# Patient Record
Sex: Female | Born: 1935 | Race: White | Hispanic: No | Smoking: Never smoker
Health system: Southern US, Community
[De-identification: ages and names within clinical notes are randomized; demographics above are authoritative.]

## PROBLEM LIST (undated history)

## (undated) DIAGNOSIS — G4730 Sleep apnea, unspecified: Secondary | ICD-10-CM

## (undated) DIAGNOSIS — E669 Obesity, unspecified: Secondary | ICD-10-CM

## (undated) DIAGNOSIS — I739 Peripheral vascular disease, unspecified: Secondary | ICD-10-CM

## (undated) DIAGNOSIS — Z86718 Personal history of other venous thrombosis and embolism: Secondary | ICD-10-CM

## (undated) DIAGNOSIS — R7302 Impaired glucose tolerance (oral): Secondary | ICD-10-CM

## (undated) DIAGNOSIS — N183 Chronic kidney disease, stage 3 unspecified: Secondary | ICD-10-CM

## (undated) DIAGNOSIS — R519 Headache, unspecified: Secondary | ICD-10-CM

## (undated) DIAGNOSIS — K219 Gastro-esophageal reflux disease without esophagitis: Secondary | ICD-10-CM

## (undated) DIAGNOSIS — I4891 Unspecified atrial fibrillation: Secondary | ICD-10-CM

## (undated) DIAGNOSIS — C801 Malignant (primary) neoplasm, unspecified: Secondary | ICD-10-CM

## (undated) DIAGNOSIS — I471 Supraventricular tachycardia, unspecified: Secondary | ICD-10-CM

## (undated) DIAGNOSIS — R51 Headache: Secondary | ICD-10-CM

## (undated) DIAGNOSIS — E785 Hyperlipidemia, unspecified: Secondary | ICD-10-CM

## (undated) DIAGNOSIS — I10 Essential (primary) hypertension: Secondary | ICD-10-CM

## (undated) DIAGNOSIS — Z972 Presence of dental prosthetic device (complete) (partial): Secondary | ICD-10-CM

## (undated) HISTORY — PX: COLON RESECTION: SHX5231

## (undated) HISTORY — PX: BREAST BIOPSY: SHX20

## (undated) HISTORY — PX: OTHER SURGICAL HISTORY: SHX169

## (undated) HISTORY — PX: HERNIA REPAIR: SHX51

## (undated) HISTORY — PX: ABDOMINAL HYSTERECTOMY: SHX81

---

## 2004-04-21 ENCOUNTER — Ambulatory Visit: Payer: Self-pay | Admitting: Internal Medicine

## 2005-04-24 ENCOUNTER — Ambulatory Visit: Payer: Self-pay | Admitting: Internal Medicine

## 2006-03-15 ENCOUNTER — Ambulatory Visit: Payer: Self-pay | Admitting: Unknown Physician Specialty

## 2006-03-16 ENCOUNTER — Ambulatory Visit: Payer: Self-pay | Admitting: Unknown Physician Specialty

## 2006-04-26 ENCOUNTER — Ambulatory Visit: Payer: Self-pay | Admitting: Internal Medicine

## 2006-06-11 ENCOUNTER — Ambulatory Visit: Payer: Self-pay | Admitting: Unknown Physician Specialty

## 2006-06-25 ENCOUNTER — Ambulatory Visit: Payer: Self-pay | Admitting: Internal Medicine

## 2006-06-26 ENCOUNTER — Ambulatory Visit: Payer: Self-pay | Admitting: Internal Medicine

## 2006-08-15 ENCOUNTER — Ambulatory Visit: Payer: Self-pay | Admitting: Internal Medicine

## 2006-08-16 ENCOUNTER — Ambulatory Visit: Payer: Self-pay | Admitting: Internal Medicine

## 2006-11-27 ENCOUNTER — Ambulatory Visit: Payer: Self-pay | Admitting: Internal Medicine

## 2006-12-03 ENCOUNTER — Ambulatory Visit: Payer: Self-pay | Admitting: Internal Medicine

## 2006-12-25 ENCOUNTER — Ambulatory Visit: Payer: Self-pay | Admitting: Internal Medicine

## 2007-01-04 ENCOUNTER — Ambulatory Visit: Payer: Self-pay | Admitting: Internal Medicine

## 2007-01-16 ENCOUNTER — Ambulatory Visit: Payer: Self-pay | Admitting: Internal Medicine

## 2007-01-25 ENCOUNTER — Ambulatory Visit: Payer: Self-pay | Admitting: Internal Medicine

## 2007-06-03 ENCOUNTER — Ambulatory Visit: Payer: Self-pay | Admitting: Internal Medicine

## 2007-06-13 ENCOUNTER — Ambulatory Visit: Payer: Self-pay | Admitting: Internal Medicine

## 2007-06-27 ENCOUNTER — Ambulatory Visit: Payer: Self-pay | Admitting: Internal Medicine

## 2007-09-16 ENCOUNTER — Other Ambulatory Visit: Payer: Self-pay

## 2007-09-16 ENCOUNTER — Ambulatory Visit: Payer: Self-pay | Admitting: Ophthalmology

## 2007-11-25 ENCOUNTER — Ambulatory Visit: Payer: Self-pay | Admitting: Internal Medicine

## 2007-12-03 ENCOUNTER — Ambulatory Visit: Payer: Self-pay | Admitting: Internal Medicine

## 2007-12-14 ENCOUNTER — Ambulatory Visit: Payer: Self-pay | Admitting: Internal Medicine

## 2007-12-25 ENCOUNTER — Ambulatory Visit: Payer: Self-pay | Admitting: Internal Medicine

## 2008-06-15 ENCOUNTER — Ambulatory Visit: Payer: Self-pay | Admitting: Internal Medicine

## 2008-11-24 ENCOUNTER — Ambulatory Visit: Payer: Self-pay | Admitting: Internal Medicine

## 2008-12-15 ENCOUNTER — Ambulatory Visit: Payer: Self-pay | Admitting: Internal Medicine

## 2008-12-24 ENCOUNTER — Ambulatory Visit: Payer: Self-pay | Admitting: Internal Medicine

## 2009-06-17 ENCOUNTER — Ambulatory Visit: Payer: Self-pay | Admitting: Internal Medicine

## 2009-11-24 ENCOUNTER — Ambulatory Visit: Payer: Self-pay | Admitting: Internal Medicine

## 2009-12-16 ENCOUNTER — Ambulatory Visit: Payer: Self-pay | Admitting: Internal Medicine

## 2009-12-22 ENCOUNTER — Ambulatory Visit: Payer: Self-pay | Admitting: Internal Medicine

## 2010-06-23 ENCOUNTER — Ambulatory Visit: Payer: Self-pay | Admitting: Internal Medicine

## 2010-09-21 ENCOUNTER — Ambulatory Visit: Payer: Self-pay | Admitting: Unknown Physician Specialty

## 2010-10-05 ENCOUNTER — Ambulatory Visit: Payer: Self-pay | Admitting: Unknown Physician Specialty

## 2011-01-24 ENCOUNTER — Ambulatory Visit: Payer: Self-pay | Admitting: Internal Medicine

## 2011-01-25 ENCOUNTER — Ambulatory Visit: Payer: Self-pay | Admitting: Internal Medicine

## 2011-02-25 ENCOUNTER — Ambulatory Visit: Payer: Self-pay | Admitting: Internal Medicine

## 2011-05-17 ENCOUNTER — Ambulatory Visit: Payer: Self-pay | Admitting: Unknown Physician Specialty

## 2011-07-05 ENCOUNTER — Ambulatory Visit: Payer: Self-pay | Admitting: Internal Medicine

## 2011-10-19 LAB — CBC WITH DIFFERENTIAL/PLATELET
Basophil #: 0 10*3/uL (ref 0.0–0.1)
Basophil %: 0.5 %
Comment - H1-Com2: NORMAL
HCT: 39 % (ref 35.0–47.0)
HGB: 13.7 g/dL (ref 12.0–16.0)
Lymphocyte #: 2 10*3/uL (ref 1.0–3.6)
Lymphocyte %: 23.1 %
MCH: 33.7 pg (ref 26.0–34.0)
MCV: 96 fL (ref 80–100)
Monocyte %: 8.6 %
Neutrophil #: 5.7 10*3/uL (ref 1.4–6.5)
Neutrophil %: 66.9 %
Platelet: 156 10*3/uL (ref 150–440)
RDW: 12.8 % (ref 11.5–14.5)
WBC: 8.6 10*3/uL (ref 3.6–11.0)

## 2011-10-19 LAB — COMPREHENSIVE METABOLIC PANEL
Albumin: 3.5 g/dL (ref 3.4–5.0)
Anion Gap: 10 (ref 7–16)
Bilirubin,Total: 0.5 mg/dL (ref 0.2–1.0)
Calcium, Total: 8.4 mg/dL — ABNORMAL LOW (ref 8.5–10.1)
Chloride: 99 mmol/L (ref 98–107)
Co2: 27 mmol/L (ref 21–32)
Creatinine: 0.95 mg/dL (ref 0.60–1.30)
EGFR (Non-African Amer.): 58 — ABNORMAL LOW
Glucose: 95 mg/dL (ref 65–99)
Osmolality: 273 (ref 275–301)
Potassium: 3.2 mmol/L — ABNORMAL LOW (ref 3.5–5.1)
SGOT(AST): 16 U/L (ref 15–37)
SGPT (ALT): 19 U/L

## 2011-10-19 LAB — PROTIME-INR
INR: 0.9
Prothrombin Time: 13 secs (ref 11.5–14.7)

## 2011-10-19 LAB — APTT: Activated PTT: 29.6 secs (ref 23.6–35.9)

## 2011-10-19 LAB — CK TOTAL AND CKMB (NOT AT ARMC): CK, Total: 109 U/L (ref 21–215)

## 2011-10-19 LAB — TROPONIN I: Troponin-I: 0.02 ng/mL

## 2011-10-20 ENCOUNTER — Inpatient Hospital Stay: Payer: Self-pay | Admitting: Specialist

## 2011-10-20 LAB — URINALYSIS, COMPLETE
Bilirubin,UR: NEGATIVE
Blood: NEGATIVE
Nitrite: NEGATIVE
Ph: 5 (ref 4.5–8.0)
Protein: NEGATIVE
RBC,UR: 5 /HPF (ref 0–5)
Squamous Epithelial: 3
Transitional Epi: 1
WBC UR: 39 /HPF (ref 0–5)

## 2011-10-20 LAB — HEMOGLOBIN A1C: Hemoglobin A1C: 6.1 % (ref 4.2–6.3)

## 2011-10-20 LAB — LIPID PANEL: HDL Cholesterol: 34 mg/dL — ABNORMAL LOW (ref 40–60)

## 2011-10-20 LAB — CK TOTAL AND CKMB (NOT AT ARMC): CK-MB: 2.5 ng/mL (ref 0.5–3.6)

## 2011-10-20 LAB — TSH: Thyroid Stimulating Horm: 3.44 u[IU]/mL

## 2011-10-20 LAB — MAGNESIUM: Magnesium: 1.8 mg/dL

## 2012-07-05 ENCOUNTER — Ambulatory Visit: Payer: Self-pay | Admitting: Internal Medicine

## 2012-08-28 ENCOUNTER — Ambulatory Visit: Payer: Self-pay | Admitting: Ophthalmology

## 2013-07-10 ENCOUNTER — Inpatient Hospital Stay: Payer: Self-pay | Admitting: Internal Medicine

## 2013-07-10 DIAGNOSIS — Z86718 Personal history of other venous thrombosis and embolism: Secondary | ICD-10-CM

## 2013-07-10 HISTORY — DX: Personal history of other venous thrombosis and embolism: Z86.718

## 2013-07-10 LAB — CK TOTAL AND CKMB (NOT AT ARMC)
CK, Total: 51 U/L (ref 21–215)
CK, Total: 56 U/L
CK, Total: 43 U/L (ref 21–215)
CK-MB: 1.6 ng/mL (ref 0.5–3.6)
CK-MB: 1.6 ng/mL
CK-MB: 1.6 ng/mL (ref 0.5–3.6)

## 2013-07-10 LAB — COMPREHENSIVE METABOLIC PANEL
ALK PHOS: 70 U/L
ANION GAP: 8 (ref 7–16)
Albumin: 3.5 g/dL (ref 3.4–5.0)
BILIRUBIN TOTAL: 0.6 mg/dL (ref 0.2–1.0)
BUN: 14 mg/dL (ref 7–18)
CHLORIDE: 97 mmol/L — AB (ref 98–107)
Calcium, Total: 8.6 mg/dL (ref 8.5–10.1)
Co2: 27 mmol/L (ref 21–32)
Creatinine: 0.82 mg/dL (ref 0.60–1.30)
EGFR (African American): >60
GLUCOSE: 117 mg/dL — AB (ref 65–99)
Osmolality: 266 (ref 275–301)
Potassium: 3.2 mmol/L — ABNORMAL LOW (ref 3.5–5.1)
SGOT(AST): 16 U/L (ref 15–37)
SGPT (ALT): 16 U/L (ref 12–78)
Sodium: 132 mmol/L — ABNORMAL LOW (ref 136–145)
TOTAL PROTEIN: 6.9 g/dL (ref 6.4–8.2)

## 2013-07-10 LAB — CBC
HCT: 43.8 % (ref 35.0–47.0)
HGB: 15.1 g/dL (ref 12.0–16.0)
MCH: 32.5 pg (ref 26.0–34.0)
MCHC: 34.6 g/dL (ref 32.0–36.0)
MCV: 94 fL (ref 80–100)
PLATELETS: 155 10*3/uL (ref 150–440)
RBC: 4.66 10*6/uL (ref 3.80–5.20)
RDW: 12.9 % (ref 11.5–14.5)
WBC: 9 10*3/uL (ref 3.6–11.0)

## 2013-07-10 LAB — PROTIME-INR
INR: 1
PROTHROMBIN TIME: 12.6 s (ref 11.5–14.7)

## 2013-07-10 LAB — TROPONIN I
Troponin-I: <0.02 ng/mL
Troponin-I: <0.02 ng/mL

## 2013-07-10 LAB — LIPID PANEL
Cholesterol: 160 mg/dL
HDL Cholesterol: 54 mg/dL
Ldl Cholesterol, Calc: 74 mg/dL
Triglycerides: 159 mg/dL
VLDL Cholesterol, Calc: 32 mg/dL

## 2013-07-10 LAB — APTT
ACTIVATED PTT: 84.7 s — AB (ref 23.6–35.9)
Activated PTT: 29.7 secs (ref 23.6–35.9)
Activated PTT: 119.1 s — ABNORMAL HIGH

## 2013-07-11 LAB — COMPREHENSIVE METABOLIC PANEL
ALK PHOS: 66 U/L
Albumin: 3 g/dL — ABNORMAL LOW (ref 3.4–5.0)
Anion Gap: 4 — ABNORMAL LOW (ref 7–16)
BUN: 14 mg/dL (ref 7–18)
Bilirubin,Total: 0.7 mg/dL (ref 0.2–1.0)
CALCIUM: 8.4 mg/dL — AB (ref 8.5–10.1)
Chloride: 98 mmol/L (ref 98–107)
Co2: 33 mmol/L — ABNORMAL HIGH (ref 21–32)
Creatinine: 0.91 mg/dL (ref 0.60–1.30)
EGFR (African American): >60
EGFR (Non-African Amer.): >60
Glucose: 116 mg/dL — ABNORMAL HIGH (ref 65–99)
Osmolality: 272 (ref 275–301)
Potassium: 3.3 mmol/L — ABNORMAL LOW (ref 3.5–5.1)
SGOT(AST): 8 U/L — ABNORMAL LOW (ref 15–37)
SGPT (ALT): 13 U/L (ref 12–78)
SODIUM: 135 mmol/L — AB (ref 136–145)
TOTAL PROTEIN: 6 g/dL — AB (ref 6.4–8.2)

## 2013-07-11 LAB — CBC WITH DIFFERENTIAL/PLATELET
BASOS PCT: 0.7 %
Basophil #: 0.1 10*3/uL (ref 0.0–0.1)
Eosinophil #: 0.2 10*3/uL (ref 0.0–0.7)
Eosinophil %: 2.9 %
HCT: 39.5 % (ref 35.0–47.0)
HGB: 13.4 g/dL (ref 12.0–16.0)
LYMPHS ABS: 1.8 10*3/uL (ref 1.0–3.6)
Lymphocyte %: 24.1 %
MCH: 32 pg (ref 26.0–34.0)
MCHC: 33.9 g/dL (ref 32.0–36.0)
MCV: 95 fL (ref 80–100)
MONO ABS: 0.6 x10 3/mm (ref 0.2–0.9)
Monocyte %: 7.7 %
NEUTROS ABS: 4.7 10*3/uL (ref 1.4–6.5)
NEUTROS PCT: 64.6 %
Platelet: 140 10*3/uL — ABNORMAL LOW (ref 150–440)
RBC: 4.18 10*6/uL (ref 3.80–5.20)
RDW: 13.4 % (ref 11.5–14.5)
WBC: 7.3 10*3/uL (ref 3.6–11.0)

## 2013-07-11 LAB — APTT: Activated PTT: 85.8 secs — ABNORMAL HIGH (ref 23.6–35.9)

## 2013-07-12 LAB — APTT
ACTIVATED PTT: 63.2 s — AB (ref 23.6–35.9)
Activated PTT: 46.2 secs — ABNORMAL HIGH (ref 23.6–35.9)

## 2013-08-27 ENCOUNTER — Ambulatory Visit: Payer: Self-pay | Admitting: Family Medicine

## 2014-01-08 ENCOUNTER — Emergency Department: Payer: Self-pay | Admitting: Emergency Medicine

## 2014-01-13 DIAGNOSIS — E782 Mixed hyperlipidemia: Secondary | ICD-10-CM | POA: Insufficient documentation

## 2014-02-17 DIAGNOSIS — I824Y2 Acute embolism and thrombosis of unspecified deep veins of left proximal lower extremity: Secondary | ICD-10-CM | POA: Insufficient documentation

## 2014-02-17 DIAGNOSIS — K219 Gastro-esophageal reflux disease without esophagitis: Secondary | ICD-10-CM | POA: Insufficient documentation

## 2014-10-17 NOTE — Consult Note (Signed)
PATIENT NAME:  Tiffany Shaffer, VU MR#:  [id number] DATE OF BIRTH:  1931-08-23  DATE OF CONSULTATION:  07/11/2013  REFERRING PHYSICIAN:  Dr. Minerva Fester PHYSICIAN:  Corey Skains, MD  REASON FOR CONSULTATION: Atrial fibrillation with embolic phenomenon and hypertension.   CHIEF COMPLAINT: "My leg hurts."  HISTORY OF PRESENT ILLNESS: This is a 81 year old female with known supraventricular tachycardia with intermittent atrial fibrillation, on metoprolol, who has had an EKG of atrial fibrillation with right bundle branch block and heart rate control, 78 beats per minute. The patient has had an acute onset of embolic phenomenon to her foot with significant pain and is now status post embolectomy without any complications. There is still mild pain, but good arterial pulse. The patient has had no evidence of other strokelike symptoms or other issues, but has had some mild hypertension. Therefore, her CHADS score is 3, and she should be on anticoagulation long-term. We have discussed other treatments as well. The patient has had no evidence of significant hyperlipidemia requiring additional medication management. Echocardiogram has shown normal LV systolic function with ejection fraction of 60% with no evidence of acute coronary syndrome and/or heart failure. The patient has not had any troponin elevation. Currently, the patient feels better.   REVIEW OF SYSTEMS: Remainder negative for vision change, ringing in the ears, hearing loss, cough, congestion, heartburn, nausea, vomiting, diarrhea, bloody stool, stomach pain, extremity pain. Is positive with recent embolism.  leg weakness, cramping of the buttocks, known blood clots, headaches, blackouts, dizzy spells, nosebleeds, congestion, trouble swallowing, frequent urination, urination at night, muscle weakness, numbness, anxiety, depression, skin lesions, skin rashes.   PAST MEDICAL HISTORY: 1.  Atrial fibrillation.  2.  Hypertension.  3.   Hyperlipidemia.   FAMILY HISTORY: No family members with early onset of cardiovascular disease or hypertension.   SOCIAL HISTORY: Currently denies alcohol or tobacco use.   ALLERGIES: She has no known drug allergies.   CURRENT MEDICATIONS: As listed.   PHYSICAL EXAMINATION: VITAL SIGNS: Blood pressure 120/65 bilaterally, heart rate 78 upright, reclining, and irregular.  GENERAL: She is a well-appearing female in no acute distress.  HEAD, EYES, EARS, NOSE, THROAT: No icterus, thyromegaly, ulcers, hemorrhage, or xanthelasma.  CARDIOVASCULAR: Irregularly regular with normal S1 and S2 without murmur, gallop, or rub. PMI is normal size and placement. Carotid upstroke normal without bruit. Jugular venous pressure is normal.  LUNGS: Clear to auscultation with normal respirations.  ABDOMEN: Soft, nontender, without hepatosplenomegaly or masses. Abdominal aorta is normal size without bruit.  EXTREMITIES: Show 2+ radial, femoral pulses with no lower extremity edema, cyanosis, clubbing, or ulcers.  NEUROLOGIC: She is oriented to time, place, and person with normal mood and affect.   ASSESSMENT: This is a 81 year old female with embolic phenomenon, likely from atrial fibrillation and hypertension with hyperlipidemia, needing further treatment options.   RECOMMENDATIONS: 1.  Continue metoprolol current dose, which is controlling heart rate, with no evidence that she will be able to get back into the normal rhythm.  2.  Continue heparin for anticoagulation for embolic phenomenon and change to Xarelto as soon as possible, avoiding potential bleeding complications with recent procedure with embolectomy, for long-term anticoagulation, for risk reduction of embolism and stroke with a CHADS score of 3. 3.  Ambulation without restriction.  4.  No further intervention or other treatment options necessary for valvular heart disease. Clinically stable.  5.  Outpatient evaluation and continued management after  discharge in 1 week.   ____________________________ Corey Skains, MD [id number]  D: 07/11/2013 17:04:00 ET T: 07/11/2013 17:39:00 ET JOB#: [id number]  cc: Corey Skains, MD, <Dictator> Corey Skains MD ELECTRONICALLY SIGNED 07/14/2013 8:46

## 2014-10-17 NOTE — Consult Note (Signed)
CHIEF COMPLAINT and HISTORY:  Subjective/Chief Complaint left leg pain, numbness   History of Present Illness Patient is admitted with left leg pain and numbness.  Numbness is better and she has motor function as well.  left foot still painful and cooler than right.  Recent onset a. fib.  Was not on anticoagulants.  CT shows embolus at left TP trunk.   PAST MEDICAL/SURGICAL HISTORY:  Past Medical History:   Atrial Fibrillation:    Bilateral adrenal gland masses:    Osteoarthritis:    GERD:    HTN:    partial hysterectomy:    Colonic resection:   ALLERGIES:  Allergies:  No Known Allergies:   HOME MEDICATIONS:  Home Medications: Medication Instructions Status  chlorthalidone 50 mg oral tablet 0.5 tab(s) orally once a day  Active  Acuprin 81 oral tablet 1  orally once a day  Active  glucosamine-chondroitin 2 tab(s)  once a day Active  omeprazole 20 mg oral delayed release capsule 1 cap(s) orally once a day Active  metoprolol tartrate 50 mg oral tablet 1 tab(s) orally 2 times a day Active   Family and Social History:  Family History Non-Contributory   Social History negative tobacco, negative ETOH   Place of Living Home   Review of Systems:  Subjective/Chief Complaint a fib left leg symptoms   Fever/Chills No   Cough No   Sputum No   Abdominal Pain No   Diarrhea No   Constipation No   Nausea/Vomiting No   SOB/DOE No   Chest Pain No   Telemetry Reviewed Afib   Dysuria No   Tolerating PT Yes   Tolerating Diet Yes   Medications/Allergies Reviewed Medications/Allergies reviewed   Physical Exam:  GEN well developed, well nourished   HEENT pink conjunctivae, moist oral mucosa   NECK No masses  trachea midline   RESP normal resp effort  no use of accessory muscles   CARD irregular rate  no JVD   VASCULAR ACCESS none   ABD denies tenderness  normal BS   GU foley catheter in place  clear yellow urine draining   LYMPH negative neck,  negative axillae   EXTR negative cyanosis/clubbing, negative edema, LLE cool, no palpable pedal pulses on left.  Good pulses on right   SKIN normal to palpation   NEURO cranial nerves intact, motor/sensory function intact   PSYCH alert, A+O to time, place, person   LABS:  Laboratory Results: Hepatic:    15-Jan-15 03:27, Comprehensive Metabolic Panel  Bilirubin, Total 0.6  Alkaline Phosphatase 70  45-117  NOTE: New Reference Range  05/16/13  SGPT (ALT) 16  SGOT (AST) 16  Total Protein, Serum 6.9  Albumin, Serum 3.5  Cardiology:    15-Jan-15 03:17, ECG  Ventricular Rate 105  Atrial Rate 87  QRS Duration 134  QT 360  QTc 475  R Axis 57  T Axis -68  ECG interpretation   Atrial fibrillation with rapid ventricular response  Right bundle branch block  T wave abnormality, consider inferolateral ischemia or digitalis effect  Abnormal ECG  When compared with ECG of 19-Oct-2011 22:33,  T wave inversion more evident in Inferior leads  ----------unconfirmed----------  Confirmed by OVERREAD, NOT ([id number]), editor PEARSON, BARBARA ([id number]) on 07/10/2013 8:16:12 AM  ECG   Routine Chem:    15-Jan-15 03:27, Comprehensive Metabolic Panel  Glucose, Serum 117  BUN 14  Creatinine (comp) 0.82  Sodium, Serum 132  Potassium, Serum 3.2  Chloride, Serum 97  CO2, Serum 27  Calcium (Total), Serum 8.6  Osmolality (calc) 266  eGFR (African American) >60  eGFR (Non-African American) >60  eGFR values <74m/min/1.73 m2 may be an indication of chronic  kidney disease (CKD).  Calculated eGFR is useful in patients with stable renal function.  The eGFR calculation will not be reliable in acutely ill patients  when serum creatinine is changing rapidly. It is not useful in   patients on dialysis. The eGFR calculation may not be applicable  to patients at the low and high extremes of body sizes, pregnant  women, and vegetarians.  Anion Gap 8    15-Jan-15 03:27, Lipid Profile ([facility])  Cholesterol, Serum  160  Triglycerides, Serum 159  HDL (INHOUSE) 54  VLDL Cholesterol Calculated 32  LDL Cholesterol Calculated 74  Result(s) reported on 10 Jul 2013 at 08:41AM.  Cardiac:    15-Jan-15 03:27, Cardiac Panel  CK, Total 51  CPK-MB, Serum 1.6  Result(s) reported on 10 Jul 2013 at 04:08AM.    15-Jan-15 03:27, Troponin I  Troponin I < 0.02  0.00-0.05  0.05 ng/mL or less: NEGATIVE   Repeat testing in 3-6 hrs   if clinically indicated.  >0.05 ng/mL: POTENTIAL   MYOCARDIAL INJURY. Repeat   testing in 3-6 hrs if   clinically indicated.  NOTE: An increase or decrease   of 30% or more on serial   testing suggests a   clinically important change    15-Jan-15 14:22, Cardiac Panel  CK, Total 56  CPK-MB, Serum 1.6  Result(s) reported on 10 Jul 2013 at 04:01PM.    15-Jan-15 14:22, Troponin I  Troponin I < 0.02  0.00-0.05  0.05 ng/mL or less: NEGATIVE   Repeat testing in 3-6 hrs   if clinically indicated.  >0.05 ng/mL: POTENTIAL   MYOCARDIAL INJURY. Repeat   testing in 3-6 hrs if   clinically indicated.  NOTE: An increase or decrease   of 30% or more on serial   testing suggests a   clinically important change  Routine Coag:    15-Jan-15 03:27, Activated PTT  Activated PTT (APTT) 29.7  A HCT value >55% may artifactually increase the APTT. In one study,  the increase was an average of 19%.  Reference: "Effect on Routine and Special Coagulation Testing Values  of Citrate Anticoagulant Adjustment in Patients with High HCT Values."  American Journal of Clinical Pathology 2006;126:400-405.    15-Jan-15 03:27, Prothrombin Time  Prothrombin 12.6  INR 1.0  INR reference interval applies to patients on anticoagulant therapy.  A single INR therapeutic range for coumarins is not optimal for all  indications; however, the suggested range for most indications is  2.0 - 3.0.  Exceptions to the INR Reference Range may include: Prosthetic heart  valves, acute myocardial infarction, prevention of  myocardial  infarction, and combinations of aspirin and anticoagulant. The need  for a higher or lower target INR must be assessed individually.  Reference: The Pharmacology and Management of the Vitamin K   antagonists: the seventh ACCP Conference on Antithrombotic and  Thrombolytic Therapy. COIBBC.4888Sept:126 (3suppl): [id number]  A HCT value >55% may artifactually increase the PT.  In one study,   the increase was an average of 25%.  Reference:  "Effect on Routine and Special Coagulation Testing Values  of Citrate Anticoagulant Adjustment in Patients with High HCT Values."  American Journal of Clinical Pathology 2006;126:400-405.    15-Jan-15 14:22, Activated PTT  Activated PTT (APTT) 119.1  A HCT value >55% may artifactually increase the APTT.  In one study,  the increase was an average of 19%.  Reference: "Effect on Routine and Special Coagulation Testing Values  of Citrate Anticoagulant Adjustment in Patients with High HCT Values."  American Journal of Clinical Pathology 2006;126:400-405.  Routine Hem:    15-Jan-15 03:27, Hemogram, Platelet Count  WBC (CBC) 9.0  RBC (CBC) 4.66  Hemoglobin (CBC) 15.1  Hematocrit (CBC) 43.8  Platelet Count (CBC) 155  Result(s) reported on 10 Jul 2013 at 03:48AM.  MCV 94  MCH 32.5  MCHC 34.6  RDW 12.9   RADIOLOGY:  Radiology Results: XRay:    15-Jan-15 03:41, Chest Portable Single View  Chest Portable Single View  REASON FOR EXAM:    CP  COMMENTS:       PROCEDURE: DXR - DXR PORTABLE CHEST SINGLE VIEW  - Jul 10 2013  3:41AM     CLINICAL DATA:  Chest pain and subjective tachycardia    EXAM:  PORTABLE CHEST - 1 VIEW    COMPARISON:  10/19/2011    FINDINGS:  Normalheart size and mediastinal contours. No acute infiltrate or  edema. No effusion or pneumothorax. No acute osseous findings.   IMPRESSION:  No active disease.      Electronically Signed    By: Jorje Guild M.D.    On: 07/10/2013 04:20         Verified By:  Gilford Silvius, M.D.,  [place]:  PACS Image    15-Jan-15 05:51, CT Angiography Aorta Iliofemoral Runoff  PACS Image  CT:  CT Angiography Aorta Iliofemoral Runoff  REASON FOR EXAM:    AFIB W/ RVR; LEFT FOOT COOL  COMMENTS:   May transport without cardiac monitor    PROCEDURE: CT  - CT ANGIOGRAPHY AORTA RUNOFF  - Jul 10 2013  5:51AM     CLINICAL DATA:  81 year old female with AFib and RVR and left foot  pain    EXAM:  CT ANGIOGRAPHY OF ABDOMINAL AORTA WITH ILIOFEMORAL RUNOFF    TECHNIQUE:  Multidetector CT imaging of the abdomen, pelvis and lower  extremities was performed using the standard protocol during bolus  administration of intravenous contrast. Multiplanar CT image  reconstructions including MIPs were obtained to evaluate the  vascular anatomy.    CONTRAST:  One hundred twenty-five no L Isovue 370 administered  intravenously    COMPARISON:  Prior CT chest/abdomen/pelvis 01/25/2011 currently  unavailable for review    FINDINGS:  VASCULAR    Aorta: Normal caliber aorta with heterogeneous predominantly  calcified atherosclerotic plaque throughout. No aneurysmal  dilatation, dissection, or irregular plaque or wall adherent  thrombus. No penetrating aortic ulcer.    Celiac: Calcified atherosclerotic plaque at the origin without  significant narrowing. Conventional hepatic arterial anatomy. No  visceral artery aneurysm.    SMA: Focal calcified plaque at the origin results in mild narrowing.  No significant poststenotic dilatation. The distal branches are  patent.    Renals: Calcified and noncalcified atherosclerotic plaque results  and mild to moderate narrowing of the origin of the left renal  artery. No significant narrowing of the rightrenal artery. Single  dominant renal arteries are noted bilaterally. No significant  accessory renals.  IMA: Patent and unremarkable.    Right Lower Extremity    Inflow: Minimal atherosclerotic plaque without  significant stenosis  or acute abnormality involving the right common and external iliac  arteries. There may be mild narrowing of the proximal right  hypogastric artery.    Outflow: Trace atherosclerotic plaque posteriorly in the common  femoral  artery. Mild scattered calcified plaque throughout the  superficial femoral and popliteal arteries without significant  stenosis.    Runoff: The proximal anterior tibial artery and tibioperoneal trunk  are minimally diseased and widely patent. The posterior tibial  artery abruptly ceases to opacify with contrast material in the mid  calf. There is moderate narrowing of the anterior tibial artery  secondary to calcified plaque at the same level. Anterior tibial and  peroneal arteries remain patent to the ankle. The plantar arch is  patent. There is some retrograde flow into the posterior tibial  artery.    Left Lower Extremity    Inflow: Trace atherosclerotic plaque in the iliac arteries. No acute  abnormality.    Outflow: No significant plaque in the common femoral artery. Minimal  atherosclerotic disease in the superficial femoral artery without  significant stenosis. The popliteal artery is patent.    Runoff: Non calcified atherosclerotic plaque results in moderate  narrowing of the origin of the anterior tibial artery. Approximately  1 cm beyond the origin of the anterior tibial artery there is a  focal filling defect within the tibioperoneal trunk extending into  the proximal peroneal and posterior tibial arteries consistent with  an acute embolus. Both vessels reconstitute just after the embolism  via retrograde flow. However, the posterior tibial artery is  diffusely and there are multi focal high-grade stenoses versus  additional small emboli in in the lower calf. The vessel ceased to  opacify with contrast material before the ankle. Additionally, the  anterior tibial artery becomes atretic and occludes just above the  ankle.  The vessel is very small caliber in this location. It is  unclear if this is secondary to stenosis or embolus. The peroneal  artery remains patent to the ankle. The distal AT reconstitutes as  the dorsalis pedis artery. There is some retrograde flow into the  distal posterior tibial artery.    Review of the MIP images confirms the above findings.    Veins: No focal venous abnormality.    NON-VASCULAR    Lower Chest: Dependent atelectasis in the lower lobes. The lung  bases are otherwise clear. Cardiomegaly with right greater than left  atrial enlargement and right ventricular dilatation. The coronary  sinus is also dilated. Atherosclerotic calcifications noted in the  coronary arteries. No pericardial effusion. No atrial or ventricular  thrombus in the visualized portions of the heart. Of note, the right  atrial appendage is partially imaged in the left atrial appendage  not included in the field of view.    Abdomen: Unremarkable CTA appearance of the stomach, duodenum,  spleen, and pancreas. An enhancing 1.7 x 2.7 right adrenal nodule is  nonspecific. Internal regions of low attenuation (less than 10  Hounsfield units) are most suggestive of a benign adenoma. There is  a smaller 1.5 cm nodule in the left adrenal gland. Normal hepatic  contour and morphology. No discrete hepatic lesion. Cholelithiasis  without secondary signs to suggest acute cholecystitis.    Unremarkable appearance of the bilateral kidneys. No focal solid  lesion, hydronephrosis or nephrolithiasis.  No evidence of obstruction or focal bowel wall thickening. The  terminal ileum is unremarkable. Surgical changes suggest prior  partial resection of the transverse colon with 2 patent primary  colocolonic anastomoses. The appendix is not identified and may be  surgically absent. No suspicious adenopathy or free fluid.    Pelvis: Surgical changes of prior hysterectomy. Unremarkable  bladder. No free fluid or  suspicious adenopathy.  Bones/Soft Tissues: No acute fracture or aggressive appearing lytic  or blastic osseous lesion. Multilevel degenerative disc disease and  lower lumbar facet arthropathy. Disc height loss most significant at  L4-L5.     IMPRESSION:  1. Acute thromboembolism in the left tibioperoneal trunk extending  into the proximal peroneal and posterior tibial arteries. Given  clinical history of AFib with RVR, this is highly likely an embolic  process. Additionally, the right posterior tibial artery abruptly  occludes in the mid calf, and the left anterior tibial artery  occludes just above the ankle. It is unclear if these occlusions  reflect additional small emboli or are simply due to the underlying  runoff disease.  2. Extensive peripheral atherosclerotic vascular disease without  significant stenosis in the inflow or outflow vessels. The majority  of the disease is confined to the bilateral runoff vessels.  Two-vessel runoff to the right foot (anterior tibial and peroneal)  and single-vessel runoff to the left foot (peroneal).  3. Bilateral adrenal nodules are favored to reflect benign adenomas.  There is a prior CT scan of the chest, abdomen and pelvis from  January 25, 2011 and December 16, 2009, both of which are currently  unavailable for review. Recommend correlation with the reports from  these prior studies to assess for stability. If the nodules were  previously present they are almost certainly benign.  4. Cardiomegaly with right greater than left atrial enlargement and  right ventricular dilatation. Dilation of the coronary sinus  suggests elevated right heart pressures/right heart strain. Of note,  no atrial or ventricular thrombus identified.  5. Cholelithiasis without evidence of cholecystitis.  6.Additional ancillary findings as above.    Initial preliminary findings of acute left tibioperoneal trunk  occlusion were relayed to Dr. Beather Arbour at 6:10 a.m. by Dr.  Pascal Lux.    Signed,  Criselda Peaches, MD    [facility]    [facility]: [facility]      Electronically Signed    By: Jacqulynn Cadet M.D.    On: 07/10/2013 07:35         Verified By: Criselda Peaches, M.D.,   ASSESSMENT AND PLAN:  Assessment/Admission Diagnosis left leg embolus with rest pain, likely from a. fib   Plan this is a potentially limb threatening situation and we have performed urgent percutaneous intervention.  Situation discussed with patient in detail.  Will need anticoagulation in hospital and at discharge.     level 4   Electronic Signatures: Algernon Huxley (MD)  (Signed 15-Jan-15 16:48)  Authored: Chief Complaint and History, PAST MEDICAL/SURGICAL HISTORY, ALLERGIES, HOME MEDICATIONS, Family and Social History, Review of Systems, Physical Exam, LABS, RADIOLOGY, Assessment and Plan   Last Updated: 15-Jan-15 16:48 by Algernon Huxley (MD)

## 2014-10-17 NOTE — H&P (Signed)
PATIENT NAME:  Tiffany Shaffer, Tiffany Shaffer MR#:  [id number] DATE OF BIRTH:  [birth year]  DATE OF ADMISSION:  07/10/2013  PRIMARY CARE PHYSICIAN: Apolonio Schneiders, MD  HISTORY OF PRESENT ILLNESS: The patient is a 81 year old Caucasian female with past medical history significant for history of admission for SVT in April 2013, presents to the hospital with complaints of left leg numbness as well as feeling cold. According to the patient, she was doing well up until a couple of days ago when her feet started feeling somewhat funny and strange, but she had no significant pains. She started noticing left foot numbness as well as feeling cold since around 11:30 yesterday, 14th of January 2015. She decided to come to the Emergency Room for further evaluation. Upon evaluation she was noted to be in A-fib, which is new diagnosis for her. She also had CTA of her leg done and was noted to have acute thromboembolism in left tibioperoneal trunk extending into proximal peroneal as well as posterior tibial arteries. Hospitalist services were contacted for admission.  PAST MEDICAL HISTORY: Significant for admission in April 2013 for SVT, hypertension, gastroesophageal reflux disease, osteoporosis, hyperlipidemia, and adrenal adenoma which is followed by [facility].   MEDICATIONS: According to medical record, the patient is on aspirin 81 mg p.o. daily, chlorthalidone 50 mg p.o. 1/2 tablet once daily, metoprolol tartrate 50 mg p.o. twice daily, omeprazole 20 mg p.o. daily.  PAST SURGICAL HISTORY: Benign colon polyps status post left sigmoidectomy, hysterectomy, and breast biopsy as well as bilateral cataract surgery.   ALLERGIES: No known drug allergies.   FAMILY HISTORY: The patient had 2 brothers with colon cancer as well as 1 sister who had leukemia and another sister had lung cancer.   SOCIAL HISTORY: The patient drinks bourbon nightly. No tobacco or other drugs.  REVIEW OF SYSTEMS: Positive for feeling tired, weak and short  of breath intermittently. She attributes her shortness of breath to being old, recent upper respiratory infection, intermittent cough with no sputum production, also some blurring of vision. CONSTITUTIONAL: Denies any fevers or chills, fatigue, weakness, pains, weight loss or gain. EYES: Denies double vision, glaucoma.  ENT: Denies any tinnitus, allergies, epistaxis, sinus tenderness, difficulty swallowing. RESPIRATORY: Denies wheezes, asthma, COPD. CARDIOVASCULAR: Denies chest pains, orthopnea, edema, palpitations or syncope. GASTROINTESTINAL: Denies nausea, vomiting, diarrhea, constipation. GENITOURINARY: Denies dysuria, hematuria, frequency, incontinence.  ENDOCRINE: Denies any polydipsia, nocturia, thyroid problems, heat or cold intolerance or thirst. HEMATOLOGY: Denies anemia, easy bruising, bleeding or swollen glands. SKIN: Denies any acne, rashes, lesions or change in moles.  MUSCULOSKELETAL: Denies arthritis, cramps, swelling, gout.  NEUROLOGIC: No numbness, epilepsy, tremor. PSYCHIATRIC: Denies anxiety, insomnia, depression.   PHYSICAL EXAMINATION: VITAL SIGNS: On arrival to the hospital, the patient's temperature was 98.1, pulse was 106, respiration rate 18, blood pressure 132/80, and saturation was 95% on room air.  GENERAL: This is a well-developed, well-nourished Caucasian female in no significant distress lying on the stretcher.  HEENT: Her pupils are equal and reactive to light. Extraocular movements are intact. No icterus or conjunctivitis. Has normal hearing. No pharyngeal erythema. Mucosa is moist.  NECK: No masses. Supple and nontender. Thyroid not enlarged. No adenopathy. No JVD or bruits bilaterally. Full range of motion.  LUNGS: Clear to auscultation in all fields. No rales, rhonchi or diminished breath sounds or wheezing. No labored inspirations, increased effort, dullness to percussion, not in overt respiratory distress.  CARDIOVASCULAR: S1, S2 appreciated. Rhythm is  irregularly irregular. PMI not lateralized. Chest is nontender to palpation. +2  pedal pulses. 2+ dorsalis pedis pulse on the right and diminished on the left; however, palpable dorsalis pedis. The patient's left foot is colder extending to the lower third of tibial area. No edema or calf tenderness was noted.  ABDOMEN: Soft, nontender. Bowel sounds are present. No hepatosplenomegaly or masses were palpable. RECTAL: Deferred. MUSCULOSKELETAL: Muscle strength: Able to move all extremities. No cyanosis, degenerative joint disease or kyphosis. Gait was not tested.  SKIN: Did not reveal any rashes, lesions, erythema, nodularity or induration. It was warm and dry to palpation, except as mentioned above.  LYMPH: No adenopathy in the cervical region.  NEUROLOGICAL: Cranial nerves grossly intact. Sensory is intact. No dysarthria or aphasia. PSYCH: The patient is alert and oriented to time, person and place, cooperative. Memory is good. No significant confusion, agitation or depression.  LABORATORY AND DIAGNOSTICS: Sodium 132, potassium 3.2, glucose 117, Otherwise comprehensive metabolic panel within normal limits. The patient's cardiac enzymes, first set negative.  CBC is within normal limits. Coagulation panel not completely within normal limits with pro time of 12.6, INR 1 and activated PTT 29.7.   Radiologic studies: Chest x-ray, portable single view, 15th of January 2015, showed no active disease.  CT angiography of aorta with iliofemoral runoff revealed acute thromboembolism in left tibioperoneal trunk extending proximal to peroneal as well as posterior tibial arteries. Given the clinical history of A-fib as well as RVR, it is highly likely this is an embolic process. Additionally, the right posterior tibial artery abruptly occludes in the mid calf and the left anterior tibial artery occludes just above the ankle. It is unclear if these occlusions reflect additional small emboli or simply due to underlying  runoff disease. Extensive peripheral atherosclerotic vascular disease without significant stenosis in inflow or outflow vessels. The majority of the disease is confined to the bilateral runoff vessels. Two vessel runoff to the right foot, anterior tibial as well as peroneal, and single vessel runoff to the left foot peroneal. Bilateral adrenal nodules are favored to reflect benign adenomas. There is a prior CT scan of the chest, abdomen and pelvis from 01/25/2011 and 12/16/2009, both of which are currently unavailable for review. Recommend correlation with the reports from these prior studies to assess the stability. If the nodules were previously present, they are almost certainly benign. Cardiomegaly with right greater than left atrial enlargement and right ventricular dilatation. Dilation of the coronary sinus suggests elevated right heart pressures/right heart strain. Of note, no arterial or ventricular thrombus was identified. Cholelithiasis without evidence of cholecystitis. Additional ancillary findings as previously discussed.   EKG showed A-fib with rate of 105 beats per minute, RVR, right bundle branch block, T wave abnormality, consider inferior lateral ischemia or digitalis effect, according to EKG criteria   ASSESSMENT AND PLAN: 1.  Acute left lower extremity thromboembolism. Admit the patient to the floor. Start her on heparin IV. Get vascular involved for thrombectomy. 2.  Atrial fibrillation, rapid ventricular response. Will continue metoprolol as well as heparin. Will need likely Coumadin or new anti-coagulant upon discharge home. 3.  Generalized weakness. We will get physical therapist involved after procedure.  4.  Hypertension. We will follow. 5.  Hyperlipidemia. Get lipid panel and initiate the patient on antihyperlipidemic medications.   TIME SPENT: 50 minutes.  ____________________________ Theodoro Grist, MD rv:sb D: 07/10/2013 08:08:16 ET T: 07/10/2013 09:09:22  ET JOB#: [id number]  cc: Theodoro Grist, MD, <Dictator> Vianne Bulls. Arline Asp, MD Theodoro Grist MD ELECTRONICALLY SIGNED 07/18/2013 14:22

## 2014-10-17 NOTE — Op Note (Signed)
PATIENT NAME:  Tiffany Shaffer, Tiffany Shaffer MR#:  [id number] DATE OF BIRTH:  June 14, 1932  DATE OF PROCEDURE:  07/10/2013  PREOPERATIVE DIAGNOSES:  1.  Acute left lower extremity ischemia from embolic disease.  2.  Rest pain, left lower extremity, secondary to acute ischemia from embolic disease.  3.  Atrial fibrillation.  4.  Hypertension.   POSTOPERATIVE DIAGNOSES:  1.  Acute left lower extremity ischemia from embolic disease.  2.  Rest pain, left lower extremity, secondary to acute ischemia from embolic disease.  3.  Atrial fibrillation.  4.  Hypertension.   PROCEDURES:  1.  Ultrasound guidance for vascular access, right femoral artery.  2.  Catheter placement to left peroneal artery and left anterior tibial artery from right femoral approach.  3.  Aortogram and selective left lower extremity angiogram.  4.  Catheter-directed thrombolysis with 4 mg of tPA to the TP trunk and peroneal artery.  5.  Percutaneous transluminal angioplasty of left tibioperoneal trunk, peroneal artery with 3 mm diameter angioplasty balloon.  6.  Percutaneous transluminal angioplasty of the anterior tibial artery with 3 mm diameter angioplasty balloon.  7.  StarClose closure device, right femoral artery.   SURGEON: Algernon Huxley, M.D.   ANESTHESIA: Local with moderate conscious sedation.   ESTIMATED BLOOD LOSS: 25 mL.   INDICATION FOR PROCEDURE: This is a 81 year old white female who presents with a less than 24-hour history of rest pain and numbness of her left foot. Her numbness has improved. She still has pain in the foot and it is cooler than the right lower extremity.   A CT angiogram performed by the Emergency Department showed occlusion of the tibioperoneal trunk for embolic disease on the left with poor runoff distally.   She is brought in for angiography for further evaluation and potential treatment. Risks and benefits were discussed. Informed consent was obtained.   DESCRIPTION OF PROCEDURE: The patient is  brought to the vascular interventional radiology suite. Groins were sterilely prepped and draped and a sterile surgical field was created. The right femoral head was localized with fluoroscopy. Ultrasound was used to visualize a patent right femoral artery. It was accessed under direct ultrasound guidance without difficulty with a Seldinger needle, and a J-wire and 5-French sheath were placed. Pigtail catheter was placed in the aorta at the L1-L2 level. An AP aortogram was performed. This demonstrates no flow-limiting stenosis in the aortoiliac segments with patent renal arteries and then hooked the aortic bifurcation and advanced to the left femoral head.   Selective left lower extremity angiogram was then performed. This showed widely patent common femoral artery, profunda femoris artery, superficial femoral artery down to the popliteal artery. At the tibial trifurcation was an embolus. There was some debris in the proximal anterior tibial artery and the anterior tibial artery occluded in the mid to distal segment, likely from embolic disease. The peroneal artery reconstituted several centimeters beyond its origin.   I then heparinized the patient and placed a 6-French Ansell sheath over a Terumo Advantage wire. With the Advantage wire and a Kumpe catheter, I was able to gain access to the peroneal artery and confirm intraluminal flow distally. Then 4 mg of tPA was instilled with the AngioJet catheter in the distal popliteal artery, tibioperoneal trunk and peroneal artery. This was allowed to dwell.   Angioplasty performed on the tibioperoneal trunk, peroneal artery with markedly improved flow distally although some thrombus was still present in the distal segments.   I then turned my attention to the  anterior tibial artery and exchanged for a 0.18 wire at this point and accessed the anterior tibial artery across all the areas of thrombus into the foot and confirmed intraluminal flow in the dorsalis pedis  artery. I then took a 3 mm diameter angioplasty balloon from the foot back up to the proximal anterior tibial artery with a good angiographic completion result and markedly improved flow in the anterior tibial artery.   At this point I elected to terminate the procedure. The sheath was pulled back to the ipsilateral external iliac artery and oblique arteriogram was performed. A StarClose closure device was deployed in the usual fashion with excellent hemostatic result. The patient tolerated the procedure well and was taken the recovery room in stable condition.    ____________________________ Algernon Huxley, MD jsd:np D: 07/10/2013 16:52:44 ET T: 07/10/2013 22:18:48 ET JOB#: [id number]  cc: Algernon Huxley, MD, <Dictator> Algernon Huxley MD ELECTRONICALLY SIGNED 07/14/2013 11:47

## 2014-10-17 NOTE — Discharge Summary (Signed)
PATIENT NAME:  Tiffany Shaffer, Tiffany Shaffer MR#:  [id number] DATE OF BIRTH:  1932/01/08  DATE OF ADMISSION:  07/10/2013 DATE OF DISCHARGE:  07/12/2013  ADMITTING DIAGNOSIS:  Critical limb ischemia.   DISCHARGE DIAGNOSES: 1.  Acute lower extremity ischemia due to embolic disease, status post abdominal aortogram with runoff.  2.  Left lower extremity catheter directed thrombolysis.  3.  PTA of peroneal as well as anterior tibialis arteries on the left 07/10/2013 by Dr. Lucky Cowboy.  4.  Lower extremity pain.  5.  Atrial fibrillation, new onset.  6.  Hypertension.  7.  Hyperlipidemia.  8.  Generalized weakness.   DISCHARGE CONDITION:  Stable.   DISCHARGE MEDICATIONS:  The patient is to continue:  1.  Chlorthalidone 50 mg p.o. 1/2 tablet once daily.  2.  Metoprolol tartrate 50 mg p.o. twice daily.  3.  Glucosamine chondroitin 2 tablets once daily.  4.  Omeprazole 20 mg by mouth daily.  5.  Acetaminophen/hydrocodone 325/5 mg 1 tablet every 4 hours as needed.  6.  Tramadol 50 mg every 6 hours as needed.  7.  Xarelto 15 mg twice daily for 21 days and then 10 mg once daily dose.  8.  Atorvastatin 10 mg p.o. at bedtime.  9.  The patient was advised not to take aspirin unless recommended by PCP or cardiology.   HOME OXYGEN:  None.   DIET:  2 g salt, low fat, low cholesterol, regular consistency.   ACTIVITY LIMITATIONS:  As tolerated.   FOLLOWUP:  With home health, physical therapy.  Followup appointment with Dr. Baldemar Lenis in 2 days after discharge.  Dr. Nehemiah Massed in 1 week after discharge as well as Dr. Lucky Cowboy  1 week after discharge.    CONSULTANTS:   1.  Dr. Nehemiah Massed. 2.  Dr. Lucky Cowboy.  RADIOLOGIC STUDIES:  Chest portable single view, 07/10/2013 showed no active disease.  CT angiography of aorta with iliofemoral runoff done on 07/10/2013 revealed acute thromboembolism in the left tibioperoneal trunk extending into the proximal peroneal and posterior tibial arteries.  Given the clinical history of A. Fib as well as  RVR, this is highly likely an embolic process.  Additionally, the right posterior tibial artery abruptly occludes in the mid-calf, and the left anterior tibial artery occludes just above the ankle.  It is unclear if these occlusions have left additional small emboli or simply due to underlying runoff disease.  Extensive peripheral atherosclerotic vascular disease without significant stenosis in the inflow and outflow vessels.  The majority of the disease is confined to the bilateral runoff vessels.  Two vessel runoff to the right foot, anterior tibial as well as peroneal and single vessel runoff to the left foot peroneal.  Bilateral adrenal nodules are favored to reflect benign adenomas.  There is a prior CT scan of the chest, abdomen and pelvis on 01/25/2011 and 12/16/2009, both of which are currently unavailable for review.  Recommend correlation of the reports from these prior studies to assess the stability.  If the nodules were previously present, they are almost certainly benign.  Cardiomegaly with right greater than left atrial enlargement and right ventricular dilatation.  Dilatation of the coronary sinus suggests elevated right heart pressures, right heart strain.  Of note, no atrial or ventricular thrombus was identified.  Cholelithiasis without evidence of cholecystitis.  Additional ancillary findings as above.  Echocardiogram 07/11/2013 showed left ventricular ejection fraction by visual estimation 60% to 65%, normal global left ventricular systolic function, severely dilated left atrium as well as right atrium,  moderate to severe mitral valve regurgitation, moderate to severe tricuspid regurgitation, mildly elevated pulmonary artery systolic pressure, mildly increased left ventricular posterior wall thickness.   HOSPITAL COURSE:  The patient is a 81 year old Caucasian female who presented on the 07/10/2013 with complaints of lower extremity coolness and numbness.  Please refer to Dr. Keenan Bachelor  admission on 07/10/2013.  The patient was complaining of left leg numbness as well as feeling cold.  On arrival, her pulses were found to be diminished, and leg was found to be cool, so she underwent CTA of her aorta with runoff which revealed left leg thromboembolic process.  Consultation with Dr. Lucky Cowboy was obtained, and Dr. Lucky Cowboy took the patient to operating room the same day, 07/10/2013.  The patient underwent abdominal aortogram with runoff, left lower extremity, catheter-directed thrombolysis as well as PTA of peroneal and anterior tibialis arteries under local anesthesia with estimated blood loss of approximately 25 mL, and no complications.  Post procedure, the patient did well.  She was continued on heparin therapy, and Xarelto was initiated.  Consultation with Dr. Nehemiah Massed was also obtained due to the patient being diagnosed with new onset of A-Fib, RVR.  Since the patient was not aware about her atrial fibrillation, it was unclear how long she has been having A-Fib.  However, Dr. Nehemiah Massed felt that the patient should continue Xarelto and follow up with him in the next 1 week after discharge for further recommendations.  However, he did not recommend any other therapies except a beta blocker to control her heart rate.  On 07/12/2013 the patient was felt to be stable to be discharged.  She is being discharged home with the above-mentioned medications and followup.    Her vital signs on the day of discharge, temperature was 97.4, pulse was ranging from 89 to 118, respiratory rate was 18, blood pressure 119/78, saturation was 98% on room air at rest.   TIME SPENT:  40 minutes.    ____________________________ Theodoro Grist, MD rv:ea D: 07/12/2013 19:30:21 ET T: 07/12/2013 23:39:09 ET JOB#: [id number]  cc: Theodoro Grist, MD, <Dictator> Algernon Huxley, MD Corey Skains, MD  [place] MD ELECTRONICALLY SIGNED 07/18/2013 14:30

## 2014-10-18 NOTE — Discharge Summary (Signed)
PATIENT NAME:  Tiffany Shaffer, Tiffany Shaffer MR#:  [id number] DATE OF BIRTH:  10-17-1931  DATE OF ADMISSION:  10/20/2011 DATE OF DISCHARGE:  10/21/2011  HISTORY: For a detailed note, please take a look at the history and physical done on admission by Dr. Brien Few Phichith.   DIAGNOSES AT DISCHARGE:  1. Supraventricular tachycardia, now resolved.  2. Hypertension, stable.  3. Hypokalemia, also resolved.  4. Gastroesophageal reflux disease.  5. Osteoporosis.   DIET: The patient is being discharged on a low-sodium diet.   ACTIVITY: As tolerated.   FOLLOWUP: Follow up with Dr. Apolonio Schneiders in the next 1 to 2 weeks.    DISCHARGE MEDICATIONS:  1. Glucosamine chondroitin supplement daily. 2. Prilosec OTC 20 mg daily.  3. Aspirin 81 mg daily. 4. Chlorthalidone 50 mg 1/2 tab daily.  5. Omeprazole 20 mg daily.  6. Metoprolol tartrate 50 mg b.i.d. which is being increased from 25 b.i.d.   [place] COURSE: Dr. Jordan Hawks from cardiology.   PERTINENT STUDIES: Chest x-ray done on admission showing no acute cardiopulmonary disease. A CT of head done without contrast showing no acute intracranial abnormality. A two-dimensional echocardiogram showing left atrium to be mildly dilated. Right ventricle to be mild to moderately dilated. No pericardial effusion. Ejection fraction to be normal.   HOSPITAL COURSE: This is a 81 year old female with medical problems as mentioned above who presented to the hospital due to right arm numbness and suspected confusion, also noted to be in supraventricular tachycardia.  1. Sustained supraventricular tachycardia. The patient presented to the ER with symptoms of suspected stroke and also noted to be in supraventricular tachycardia. She received one dose of IV metoprolol and also adenosine, but still continued to have persistently elevated heart rates. She was started on p.o. Cardizem. Since being on Cardizem, her heart rate has remained stable and she has had  no further episodes of supraventricular tachycardia. She has had three sets of cardiac markers that were negative and an echocardiogram which showed normal LV function. She did undergo a stress test on 10/21/2011 which showed no evidence of any myocardial ischemia. The patient therefore presently is being discharged on a higher dose of metoprolol compared to what she was taking prior to coming in.  2. Hypokalemia. Her potassium has since then been supplemented and has resolved.  3. Gastroesophageal reflux disease. The patient was maintained on her Prilosec. She will resume that.  4. Hypertension. The patient remained hemodynamically stable. She will resume her chlorthalidone upon discharge.  5. Osteoporosis. The patient will continue her chondroitin and glucosamine supplements.   CODE STATUS: The patient is a FULL CODE.  TIME SPENT: 35 minutes.   ____________________________ Belia Heman. Verdell Carmine, MD vjs:ap D: 10/21/2011 14:53:40 ET T: 10/22/2011 13:31:20 ET JOB#: [id number]  cc: Belia Heman. Verdell Carmine, MD, <Dictator> Vianne Bulls. Arline Asp, MD Henreitta Leber MD ELECTRONICALLY SIGNED 10/23/2011 15:44

## 2014-10-18 NOTE — Consult Note (Signed)
General Aspect 81 yo female with history of adrenal ademoma and intermittant svt. She was admitted with sensation of weakness, fatigue and right arm and chest pain. She was noted to have svt on arrival. She has converted back to nsr. She has ruled out for an mi. Echo done today reveals normal lv function with no significant valvular disease. She states the last time she had a prolonged episode of svt was several years ago. She is on metoprolol tartrate 25 bid at home.   Physical Exam:   GEN no acute distress, obese    HEENT PERRL    NECK supple  No masses    RESP clear BS    CARD Regular rate and rhythm  Murmur    Murmur Systolic    Systolic Murmur axilla    ABD denies tenderness  normal BS    LYMPH negative neck    SKIN No rashes    NEURO cranial nerves intact, motor/sensory function intact    PSYCH A+O to time, place, person   Review of Systems:   Subjective/Chief Complaint weakness and right arm pain with chest pain    General: No Complaints    Skin: No Complaints    ENT: No Complaints    Eyes: No Complaints    Neck: No Complaints    Respiratory: No Complaints    Cardiovascular: Chest pain or discomfort    Gastrointestinal: No Complaints    Genitourinary: No Complaints    Vascular: No Complaints    Musculoskeletal: No Complaints    Hematologic: No Complaints    Endocrine: No Complaints    Psychiatric: No Complaints    Review of Systems: All other systems were reviewed and found to be negative    Medications/Allergies Reviewed Medications/Allergies reviewed     Bilateral adrenal gland masses:    Osteoarthritis:    GERD:    HTN:    partial hysterectomy:    Colonic resection:     Admission/Visit Status,  Unit to place on: 2A - Telemetry  Hospitalist Admitting: Rita Ohara, Visit Status: Inpatient  Diagnosis: Substained SVT ; 427.89 SVT (supraventricular tachycardia)-Condition:Fair  Date of Admission: 20-Oct-2011, 20-Oct-2011,  Active, Standard   ALLERGIES, **RN/clerk must enter allergies in Pt. Info tab.  This order does not transmit automatically, 20-Oct-2011, Active, Standard   Activated PTT, STATDate to Collect:19-Oct-2011, 19-Oct-2011, 1 or more Final Results Received, Standard   Cardiac Panel, STAT  Draw Date:19-Oct-2011  Includes CK Total and CPK-MB, 19-Oct-2011, 1 or more Final Results Received, Standard   CBC Profile, STAT  Draw Date:19-Oct-2011, 19-Oct-2011, Corrected Results, Standard   Comprehensive Metabolic Panel, STAT  Draw Date:19-Oct-2011, 19-Oct-2011, 1 or more Final Results Received, Standard   Manual Differential, STAT  Draw Date:19-Oct-2011, 19-Oct-2011, Corrected Results, Standard   Prothrombin Time, STAT  Draw Date:19-Oct-2011, 19-Oct-2011, 1 or more Final Results Received, Standard   Troponin I, STAT  Draw Date:19-Oct-2011, 19-Oct-2011, 1 or more Final Results Received, Standard   Cardiac Panel, Timed  Draw Date:20-Oct-2011  Includes CK Total and CPK-MB, 20-Oct-2011, 1 or more Final Results Received, Standard   Troponin I, Timed  Draw Date:20-Oct-2011, 20-Oct-2011, 1 or more Final Results Received, Standard   Cardiac Panel, Timed  Draw Date:20-Oct-2011  Includes CK Total and CPK-MB, 20-Oct-2011, 1 or more Final Results Received, Standard   Catecholamines, Fractionated Plasma, Routine  Draw Date:20-Oct-2011, 20-Oct-2011, Specimen Received by Performing Department, Standard   Hemoglobin A1c [facility]), Routine  Draw Date:20-Oct-2011, 20-Oct-2011, 1 or more Final Results Received, Standard  Lipid Profile [facility]), Routine  Draw Date:20-Oct-2011, 20-Oct-2011, 1 or more Final Results Received, Standard   Magnesium, Serum, Routine  Draw Date:20-Oct-2011, 20-Oct-2011, 1 or more Final Results Received, Standard   Metanephrine, Fractionated, Free Plasma, Routine  Draw Date:20-Oct-2011, 20-Oct-2011, Specimen Received by Performing Department, Standard   Thyroid Stimulating  Hormone, Routine  Draw Date:20-Oct-2011, 20-Oct-2011, 1 or more Final Results Received, Standard   Troponin I, Timed  Draw Date:20-Oct-2011, 20-Oct-2011, 1 or more Final Results Received, Standard   Hemoglobin A1c [facility]), Routine  Draw Date:21-Oct-2011, 21-Oct-2011, Cancelled, Standard   Lipid Profile [facility]), Routine  Draw Date:21-Oct-2011, 21-Oct-2011, Cancelled, Standard   Magnesium, Serum, Routine  Draw Date:21-Oct-2011  add to blood already drawn if available, 21-Oct-2011, Cancelled, Standard   Urinalysis, Routine-Void  Date to Collect:20-Oct-2011, 20-Oct-2011, 1 or more Final Results Received, Standard   Metoprolol injection, ( Lopressor injection )  5 mg, IV push, STAT  Indication: Antihypertensive/ Angina, 19-Oct-2011, Completed, Standard   Metoprolol injection, ( Lopressor injection )  5 mg, IV push, STAT  Indication: Antihypertensive/ Angina, 19-Oct-2011, Completed, Standard   Metoprolol injection,  ( Lopressor injection )  5 mg, IV push, once  Indication: Antihypertensive/ Angina, 19-Oct-2011, Completed, Standard   Adenosine injection, 6 mg, IV push, once  Indication: SVT/ Diagnostic Aid Myocardial Perfusion, Rapid push followed by rapid saline flush., 19-Oct-2011, Completed, Standard   Adenosine injection, 6 mg, IV push, STAT  Indication: SVT/ Diagnostic Aid Myocardial Perfusion, Rapid push followed by rapid saline flush., 19-Oct-2011, Completed, Standard   Acetaminophen * tablet, ( Tylenol (325 mg) tablet)  650 mg Oral q4h PRN for pain or temp. greater than 100.4  - Indication: Pain/Fever, 20-Oct-2011, Active, Standard   Acetaminophen-HYDROcodone 325/5 mg tablet, ( Norco  5/325 mg)  1 to 2 tablet(s) Oral q4h PRN for pain  - Indication: Pain, 20-Oct-2011, Active, Standard   Aspirin Enteric Coated tablet, ( Ecotrin)  81 mg Oral daily  - Indication: Pain/Fever/Thromboembolic Disorders/Post MI/Prophylaxis MI  Instructions:  Initiate Bleeding Precautions  Protocol--DO NOT CRUSH, 20-Oct-2011, Active, Standard   Enoxaparin injection, ( Lovenox injection )  40 mg, Subcutaneous, daily  Indication: Prophylaxis or treatment of thromboembolic disorders, Monitor Anticoags per hospital protocol, 20-Oct-2011, Active, Standard   Ondansetron disintegrating tablet, ( Zofran ODT)  4 mg Oral q6h PRN for nausea, vomiting  - Indication: Nausea/ Vomiting, 20-Oct-2011, Active, Standard   Potassium Chloride injection, 40 mEq in Sodium Chloride 0.9% 500 ml, IV Piggyback, once, Infuse over 4 hour(s)  Indication: Hypokalemia, 20-Oct-2011, Completed, Standard   Diltiazem tablet, ( Cardizem)  30 mg Oral q6h  - Indication: Angina/ Hypertension  Instructions:  Hold for SBP less than 95 or DBP less than 60, 20-Oct-2011, Active, Standard   Omeprazole capsule,  ( PriLOSEc)  20 mg Oral q6am  - Indication: GERD  Instructions:  DO NOT CRUSH, 20-Oct-2011, Active, Standard   Pneumococcal 23-valent Vaccine, 0.5 ml, Intramuscular, atdischarge  Indication: Pneumococcal Immunization, 0.60ml IM once (Stored in [facility]), 20-Oct-2011, Active, Standard   Chest PA and Lateral, Stat-STAT-chest pain, 19-Oct-2011, 1 or more Final Results Received, Standard   CT Head Without Contrast, STAT-tia sx, 19-Oct-2011, 1 or more Final Results Received, Standard   NM Myocard Gated Stress Scan part 2 of 2, Routine-with: Treadmill  Performing MD: Jordan Hawks MD; Interpreting MD: Jordan Hawks MD  Reason for test: chest pain, 21-Oct-2011, Pending, Standard   NM Myocard Rest Scan Part 1 of 2, Routine-part 1, 21-Oct-2011, Pending, Standard   ECG, Stat-Interpreted by-.ordering physician-Reason for ecg -continued tachycaRDIA, 19-Oct-2011,  Active, Standard   Oxygen, PRN  CP please wean O2 to 92% per oxygen weaning protocol, 20-Oct-2011, Discontinued, Standard   Echo Doppler, Interpreted by: [facility]  CARDIAC Reason for Test: SVT, 20-Oct-2011, Completed, Standard   Stress Test For  Nuclear, Obtain Consent: Performing MD: Bartholome Bill MD  Reason for Exam: chest pain  Please confirm that the cardiologist will be available before scheduling., 21-Oct-2011, Pending, Standard   Cardiology Consult, ROUTINE - requires response within 24 hours  Notify-[facility]  Reason for-SVT  Note:Donna at the office notified. em  Notified Physician Office on 20-Oct-2011 at 08:17, 20-Oct-2011, Pending, Standard   Sodium Restrictive Diet, 2 Grams Sodium, 20-Oct-2011, Active, Standard   NPO except meds, For: myocardial scan, 21-Oct-2011, Active, Standard   Bleeding Precautions Protocol, 20-Oct-2011, Active, Standard   I&O Q Shift, 20-Oct-2011, Active, Standard   Telemetry Apply & Monitor, for Telemetry Unit Patients.  Must renew telemetry order every 48 hours., 20-Oct-2011, Active, Standard   Vital Signs, Q 4hr, 20-Oct-2011, Active, Standard   Hygiene, 20-Oct-2011, Active, Standard   Activity as tolerated, 20-Oct-2011, Active, Standard   CPK and CKMB q 8 hours, Unit Clerk or Nurse to enter timed lab orders.  (x3 if not drawn in the ED)., 20-Oct-2011, Completed, Standard   Troponin Q 8 hrs x 3, Unit Clerk or Nurse to enter timed lab orders., 20-Oct-2011, Completed, Standard   Clinical Parameters Reviewed, (such as vital signs, lab results, etc) as appropriate for the medication(s) being administered, 20-Oct-2011, Active, Standard   Obtain Documentation of Consent for, Myocardial scan (wlak on treadmill to stress heart to see if there is any evidence of blockages)  Initiate Procedure/Surgery Confirmation and Validation Form, 20-Oct-2011, Pending, Standard   Ensure patent IV site, Patient needs patent IV access for Myocardial Scan, 21-Oct-2011, Pending, Standard   Universal Skin Precautions, 20-Oct-2011, Active, Standard   QShift Chart Check Complete, 20-Oct-2011, Performed, Standard  Home Medications: Medication Instructions Status  glucosamine-chondroitin 1 tab(s)  once a day  Active   Prilosec OTC   once a day  Active  Acuprin 81 oral tablet 1  orally once a day  Active  chlorthalidone 50 mg oral tablet 0.5 tab(s) orally once a day  Active  metoprolol tartrate 25 mg oral tablet 1  orally 2 times a day  Active  omeprazole  orally  Active    No Known Allergies:     Impression 81 yo female with history of atypical chest pain and intermittant svt. She had an episode of symptomatic svt now converted to sinus rhtyhm. She has ruled out for an mi. She takes metoprolol 25 bid. She had a echo which revealed normal lv funciton.    Plan 1. Increase metoprolol tartrate to 50 bid 2. Stress sestamibi in am to evaluate for evidence of ischemia as etiology of right arm and chest pain.  3. Further recs after funcitonal study.   Electronic Signatures: Teodoro Spray (MD)  (Signed 26-Apr-13 20:12)  Authored: General Aspect/Present Illness, History and Physical Exam, Review of System, Past Medical History, Orders, Home Medications, Allergies, Impression/Plan   Last Updated: 26-Apr-13 20:12 by Teodoro Spray (MD)

## 2014-10-18 NOTE — H&P (Signed)
PATIENT NAME:  Tiffany Shaffer, Tiffany Shaffer MR#:  [id number] DATE OF BIRTH:  10-20-1931  DATE OF ADMISSION:  10/20/2011  REFERRING PHYSICIAN: Conni Slipper, MD  PRIMARY CARE PHYSICIAN: Tiffany Schneiders, MD    PRESENTING COMPLAINT: Right arm numbness and possible transient confusion.   HISTORY OF PRESENT ILLNESS: Tiffany Shaffer is a 81 year old woman with a history of hyperlipidemia, supraventricular tachycardia, adrenal adenoma followed by Oncology, who presented with reports of developing right arm numbness while she was lying resting. She also thinks that she may have gotten confused. Symptoms lasted maybe five minutes. By the time EMS arrived, her symptoms had resolved; but they were more concerned when she was found to be tachycardiac. On arrival in Triage, she had a pulse of 137. The patient denies any subjective feelings of tachycardia or palpitations, no syncope. She does report that for the past 2 to 3 weeks now she has been getting dizzy spells, mainly in the morning, but can also occur intermittently later in the day. She reports increased weakness. She also reports increased shortness of breath for at least one day now. No syncope. No fevers, chills, nausea, or vomiting. She denies any similar episodes in the past. She has had a history of supraventricular tachycardia noted by Holter and had a normal stress echocardiogram. Based on [facility] notes, though, this stress echo was in March 2005. The patient denies any chest pain.   PAST MEDICAL HISTORY:  1. Hyperlipidemia.  2. Hypertension.  3. Supraventricular tachycardia by Holter.  4. Adrenal adenoma followed by Tiffany Shaffer.   PAST SURGICAL HISTORY:  1. Benign colonic polyp status post left sigmoidectomy.  2. Hysterectomy.  3. Breast biopsy.  4. Right cataract surgery.   ALLERGIES: No known drug allergies.  MEDICATIONS:  1. Omeprazole 20 mg as needed.  2. Metoprolol tartrate 25 mg b.i.d.  3. Aspirin 81 mg daily.  4. Chlorthalidone 25 mg daily.   5. Glucosamine 1 capsule b.i.d.   FAMILY HISTORY: She had two brothers with colon cancer, one sister with leukemia, and one sister with lung cancer.   SOCIAL HISTORY: She drinks bourbon nightly. No tobacco or drug use.   REVIEW OF SYSTEMS: CONSTITUTIONAL: No fevers, nausea or vomiting. EYES: History of cataracts. ENT: No epistaxis or discharge. RESPIRATORY: No cough, wheezing. She reports shortness of breath for the past one day at least. CARDIOVASCULAR: As per history of present illness. GI: No nausea, vomiting, diarrhea, abdominal pain, hematemesis, or melena. GU: No dysuria or hematuria. ENDOCRINE:  No polyuria or polydipsia. HEMATOLOGIC: No easy bleeding. SKIN: No ulcers. MUSCULOSKELETAL: No neck pain or joint swelling. NEUROLOGIC: No history of strokes or seizures. PSYCHIATRIC: Denies any depression or suicidal ideation.   PHYSICAL EXAMINATION:  VITAL SIGNS: Temperature 98.4, pulse 137, respiratory rate 20, blood pressure 126/75, sating at 98% on room air.   GENERAL: Lying in bed in no apparent distress.   HEENT: Normocephalic, atraumatic. Pupils are equal, symmetric, anicteric. Nares are without discharge. Moist mucous membranes.   NECK: Soft and supple. No adenopathy or JVP.   CARDIOVASCULAR: Tachycardic. No murmurs, rubs, or gallops.   LUNGS: Clear to auscultation bilaterally. No use of accessory muscles or increased respiratory effort.   ABDOMEN: Soft, nontender, positive bowel sounds. No mass appreciated.   EXTREMITIES: Trace edema. Dorsal pedis pulses intact.   MUSCULOSKELETAL: No joint effusion.   SKIN: No ulcers.   NEUROLOGICAL: No dysarthria or aphasia. Symmetrical strength. No focal deficits. No pronator drift, nose to finger intact.   PSYCHIATRIC: She is alert  and oriented. The patient is cooperative.   PERTINENT LABORATORY, DIAGNOSTIC AND RADIOLOGICAL DATA:  CT of the head without contrast shows no acute findings.  WBC 8.6, hemoglobin 13.7, hematocrit 39,  platelets 156, MCV 96, glucose 95, BUN 16, creatinine 0.95, sodium 136, potassium 3.2, chloride 99, carbon dioxide 27, calcium 8.4.  LFTs within normal limits. Total protein 6.2.  CK 109. MB 3.5. Troponin 0.02. INR 0.9. PTT 29.6.  EKG with rate of 132, supraventricular tachycardia with no ST elevation or depression. There is right bundle branch block. Status post adenosine, she broke her rhythm but was not sustained. She has noted P waves on the rhythm strip.   ASSESSMENT AND PLAN: [name] is a 81 year old woman with a history of hypertension, supraventricular tachycardia, hyperlipidemia, adrenal adenoma, presenting with reports of right arm numbness and possible confusion. When EMS arrived, she was found to have tachycardia.   1. Sustained supraventricular tachycardia: Status post metoprolol IV x3 in the ED without improvement. She is also status post adenosine with only short-lived response. She returns to tachycardia. Her heart rate is still in the 100s to 117. I will correct her potassium and magnesium level. I will send urinalysis and TSH. Continue on telemetry. Cycle cardiac enzymes. Obtain echocardiogram. Obtain Cardiology consultation. Further diagnostics per Cardiology. We will stop her metoprolol-she did not respond in the ED, and start her on diltiazem. Hold chlorthalidone for now given her low-normal blood pressures.  2. History of adrenal adenoma: We will send catecholamines and metanephrines.  3. Hypokalemia: As above, hold chlorthalidone and replace and send magnesium level.  4. Right arm numbness and questionable confusion: Possibly in the setting of her arrhythmia. Low suspicion for a stroke event. No neurological deficits on exam. Her CT of the head is unrevealing.  5. Prophylaxis: Lovenox, aspirin and omeprazole.   TIME SPENT: Approximately 50 minutes spent on patient care.    ____________________________ Tiffany Ohara, MD ap:cbb D: 10/20/2011 00:32:40 ET T: 10/20/2011  09:22:02 ET JOB#: [id number]  cc: Tiffany Few Kamrie Fanton, MD, <Dictator> Tiffany Shaffer. Tiffany Asp, MD Tiffany Ohara MD ELECTRONICALLY SIGNED 10/23/2011 0:41

## 2014-10-29 ENCOUNTER — Other Ambulatory Visit: Payer: Self-pay | Admitting: Family Medicine

## 2014-10-29 DIAGNOSIS — Z1239 Encounter for other screening for malignant neoplasm of breast: Secondary | ICD-10-CM

## 2014-11-04 ENCOUNTER — Ambulatory Visit
Admission: RE | Admit: 2014-11-04 | Discharge: 2014-11-04 | Disposition: A | Discharge status: Home / Self Care | Payer: Medicare Other | Source: Ambulatory Visit | Attending: Family Medicine | Admitting: Family Medicine

## 2014-11-04 DIAGNOSIS — Z1231 Encounter for screening mammogram for malignant neoplasm of breast: Secondary | ICD-10-CM | POA: Diagnosis present

## 2014-11-04 DIAGNOSIS — Z1239 Encounter for other screening for malignant neoplasm of breast: Secondary | ICD-10-CM

## 2014-11-10 ENCOUNTER — Ambulatory Visit
Admission: RE | Admit: 2014-11-10 | Discharge: 2014-11-10 | Disposition: A | Discharge status: Home / Self Care | Payer: Medicare Other | Source: Ambulatory Visit | Attending: Family Medicine | Admitting: Family Medicine

## 2014-11-10 ENCOUNTER — Ambulatory Visit: Payer: Medicare Other

## 2014-11-10 ENCOUNTER — Other Ambulatory Visit: Payer: Self-pay | Admitting: Family Medicine

## 2014-11-10 DIAGNOSIS — N6489 Other specified disorders of breast: Secondary | ICD-10-CM | POA: Insufficient documentation

## 2014-11-10 DIAGNOSIS — R928 Other abnormal and inconclusive findings on diagnostic imaging of breast: Secondary | ICD-10-CM

## 2015-04-16 ENCOUNTER — Other Ambulatory Visit: Payer: Self-pay | Admitting: Physician Assistant

## 2015-04-16 DIAGNOSIS — R1031 Right lower quadrant pain: Secondary | ICD-10-CM

## 2015-04-16 DIAGNOSIS — R1032 Left lower quadrant pain: Secondary | ICD-10-CM

## 2015-04-21 ENCOUNTER — Ambulatory Visit: Payer: Medicare Other

## 2015-04-28 ENCOUNTER — Ambulatory Visit: Payer: Medicare Other

## 2015-05-27 ENCOUNTER — Ambulatory Visit
Admission: RE | Admit: 2015-05-27 | Discharge: 2015-05-27 | Disposition: A | Discharge status: Home / Self Care | Payer: Medicare Other | Source: Ambulatory Visit | Attending: Physician Assistant | Admitting: Physician Assistant

## 2015-05-27 DIAGNOSIS — K5730 Diverticulosis of large intestine without perforation or abscess without bleeding: Secondary | ICD-10-CM | POA: Diagnosis not present

## 2015-05-27 DIAGNOSIS — R1032 Left lower quadrant pain: Secondary | ICD-10-CM

## 2015-05-27 DIAGNOSIS — E279 Disorder of adrenal gland, unspecified: Secondary | ICD-10-CM | POA: Diagnosis not present

## 2015-05-27 DIAGNOSIS — R1031 Right lower quadrant pain: Secondary | ICD-10-CM | POA: Diagnosis present

## 2015-05-27 DIAGNOSIS — K8020 Calculus of gallbladder without cholecystitis without obstruction: Secondary | ICD-10-CM | POA: Insufficient documentation

## 2015-05-27 MED ORDER — IOHEXOL 350 MG/ML SOLN
100.0000 mL | Freq: Once | INTRAVENOUS | Status: AC | PRN
  Administered 2015-05-27: 100 mL via INTRAVENOUS

## 2015-10-11 ENCOUNTER — Emergency Department
Admission: EM | Admit: 2015-10-11 | Discharge: 2015-10-12 | Disposition: A | Discharge status: Home / Self Care | Payer: Medicare Other | Attending: Emergency Medicine | Admitting: Emergency Medicine

## 2015-10-11 ENCOUNTER — Emergency Department: Payer: Medicare Other

## 2015-10-11 DIAGNOSIS — R0600 Dyspnea, unspecified: Secondary | ICD-10-CM | POA: Diagnosis present

## 2015-10-11 DIAGNOSIS — Z79899 Other long term (current) drug therapy: Secondary | ICD-10-CM | POA: Insufficient documentation

## 2015-10-11 DIAGNOSIS — Z7901 Long term (current) use of anticoagulants: Secondary | ICD-10-CM | POA: Diagnosis not present

## 2015-10-11 DIAGNOSIS — R05 Cough: Secondary | ICD-10-CM | POA: Insufficient documentation

## 2015-10-11 DIAGNOSIS — J45909 Unspecified asthma, uncomplicated: Secondary | ICD-10-CM | POA: Diagnosis not present

## 2015-10-11 DIAGNOSIS — J45901 Unspecified asthma with (acute) exacerbation: Secondary | ICD-10-CM

## 2015-10-11 HISTORY — DX: Unspecified atrial fibrillation: I48.91

## 2015-10-11 LAB — COMPREHENSIVE METABOLIC PANEL
ALK PHOS: 63 U/L (ref 38–126)
ALT: 24 U/L (ref 14–54)
AST: 30 U/L (ref 15–41)
Albumin: 4.3 g/dL (ref 3.5–5.0)
Anion gap: 8 (ref 5–15)
BUN: 12 mg/dL (ref 6–20)
CALCIUM: 9.1 mg/dL (ref 8.9–10.3)
CO2: 28 mmol/L (ref 22–32)
CREATININE: 0.71 mg/dL (ref 0.44–1.00)
Chloride: 96 mmol/L — ABNORMAL LOW (ref 101–111)
Glucose, Bld: 118 mg/dL — ABNORMAL HIGH (ref 65–99)
Potassium: 4 mmol/L (ref 3.5–5.1)
Sodium: 132 mmol/L — ABNORMAL LOW (ref 135–145)
TOTAL PROTEIN: 7.7 g/dL (ref 6.5–8.1)
Total Bilirubin: 1 mg/dL (ref 0.3–1.2)

## 2015-10-11 LAB — CBC
HCT: 47.7 % — ABNORMAL HIGH (ref 35.0–47.0)
HEMOGLOBIN: 16.2 g/dL — AB (ref 12.0–16.0)
MCH: 33.1 pg (ref 26.0–34.0)
MCHC: 34 g/dL (ref 32.0–36.0)
MCV: 97.6 fL (ref 80.0–100.0)
PLATELETS: 119 10*3/uL — AB (ref 150–440)
RBC: 4.88 MIL/uL (ref 3.80–5.20)
RDW: 13.4 % (ref 11.5–14.5)
WBC: 7.2 10*3/uL (ref 3.6–11.0)

## 2015-10-11 LAB — TROPONIN I

## 2015-10-11 MED ORDER — IPRATROPIUM-ALBUTEROL 0.5-2.5 (3) MG/3ML IN SOLN
3.0000 mL | Freq: Once | RESPIRATORY_TRACT | Status: AC
  Administered 2015-10-11: 3 mL via RESPIRATORY_TRACT
  Filled 2015-10-11: qty 3

## 2015-10-11 MED ORDER — METHYLPREDNISOLONE SODIUM SUCC 125 MG IJ SOLR
125.0000 mg | Freq: Once | INTRAMUSCULAR | Status: AC
  Administered 2015-10-11: 125 mg via INTRAVENOUS
  Filled 2015-10-11: qty 2

## 2015-10-11 MED ORDER — ALBUTEROL SULFATE HFA 108 (90 BASE) MCG/ACT IN AERS: 2.0000 | INHALATION_SPRAY | Freq: Four times a day (QID) | RESPIRATORY_TRACT | Status: DC | PRN

## 2015-10-11 MED ORDER — PREDNISONE 20 MG PO TABS: 40.0000 mg | ORAL_TABLET | Freq: Every day | ORAL | Status: DC

## 2015-10-11 NOTE — ED Notes (Signed)
Per EMS, pt has been short of breath x3 days.  Pt was grocery shopping and when she got to car she was unable to catch breath.  Pt states she had MD appt in AM but couldn't wait any longer.  Pt states she has a history of afib.  Pt alert and oriented upon arrival.  Pt's BP elevated per EMS.  First reading 232/132 and second was 200/140.  Pt was 95% on RA, but placed on [facility] for comfort.

## 2015-10-11 NOTE — Discharge Instructions (Signed)
You have been seen in the emergency department today for shortness of breath. You have been prescribed prednisone/steroids, as well as albuterol inhaler. Please use these medications as prescribed. Please follow-up with your primary care physician in 2-3 days for recheck/reevaluation. Return to the emergency department for any worsening trouble breathing, development of any chest pain, fever, or any other symptom personally concerning to yourself.   Shortness of Breath Shortness of breath means you have trouble breathing. It could also mean that you have a medical problem. You should get immediate medical care for shortness of breath. CAUSES  1. Not enough oxygen in the air such as with high altitudes or a smoke-filled room. 2. Certain lung diseases, infections, or problems. 3. Heart disease or conditions, such as angina or heart failure. 4. Low red blood cells (anemia). 5. Poor physical fitness, which can cause shortness of breath when you exercise. 6. Chest or back injuries or stiffness. 30. Being overweight. 8. Smoking. 9. Anxiety, which can make you feel like you are not getting enough air. DIAGNOSIS  Serious medical problems can often be found during your physical exam. Tests may also be done to determine why you are having shortness of breath. Tests may include:  Chest X-rays.  Lung function tests.  Blood tests.  An electrocardiogram (ECG).  An ambulatory electrocardiogram. An ambulatory ECG records your heartbeat patterns over a 24-hour period.  Exercise testing.  A transthoracic echocardiogram (TTE). During echocardiography, sound waves are used to evaluate how blood flows through your heart.  A transesophageal echocardiogram (TEE).  Imaging scans. Your health care provider may not be able to find a cause for your shortness of breath after your exam. In this case, it is important to have a follow-up exam with your health care provider as directed.  TREATMENT  Treatment for  shortness of breath depends on the cause of your symptoms and can vary greatly. HOME CARE INSTRUCTIONS   Do not smoke. Smoking is a common cause of shortness of breath. If you smoke, ask for help to quit.  Avoid being around chemicals or things that may bother your breathing, such as paint fumes and dust.  Rest as needed. Slowly resume your usual activities.  If medicines were prescribed, take them as directed for the full length of time directed. This includes oxygen and any inhaled medicines.  Keep all follow-up appointments as directed by your health care provider. SEEK MEDICAL CARE IF:   Your condition does not improve in the time expected.  You have a hard time doing your normal activities even with rest.  You have any new symptoms. SEEK IMMEDIATE MEDICAL CARE IF:   Your shortness of breath gets worse.  You feel light-headed, faint, or develop a cough not controlled with medicines.  You start coughing up blood.  You have pain with breathing.  You have chest pain or pain in your arms, shoulders, or abdomen.  You have a fever.  You are unable to walk up stairs or exercise the way you normally do. MAKE SURE YOU:  Understand these instructions.  Will watch your condition.  Will get help right away if you are not doing well or get worse.   This information is not intended to replace advice given to you by your health care provider. Make sure you discuss any questions you have with your health care provider.   Document Released: 03/07/2001 Document Revised: 06/17/2013 Document Reviewed: 08/28/2011 Elsevier Interactive Patient Education 2016 [employer].  How to Use an Inhaler  Using your inhaler correctly is very important. Good technique will make sure that the medicine reaches your lungs.  HOW TO USE AN INHALER: 10. Take the cap off the inhaler. 11. If this is the first time using your inhaler, you need to prime it. Shake the inhaler for 5 seconds. Release four  puffs into the air, away from your face. Ask your doctor for help if you have questions. 12. Shake the inhaler for 5 seconds. 13. Turn the inhaler so the bottle is above the mouthpiece. 14. Put your pointer finger on top of the bottle. Your thumb holds the bottom of the inhaler. 15. Open your mouth. 16. Either hold the inhaler away from your mouth (the width of 2 fingers) or place your lips tightly around the mouthpiece. Ask your doctor which way to use your inhaler. 17. Breathe out as much air as possible. 18. Breathe in and push down on the bottle 1 time to release the medicine. You will feel the medicine go in your mouth and throat. 19. Continue to take a deep breath in very slowly. Try to fill your lungs. 20. After you have breathed in completely, hold your breath for 10 seconds. This will help the medicine to settle in your lungs. If you cannot hold your breath for 10 seconds, hold it for as long as you can before you breathe out. 21. Breathe out slowly, through pursed lips. Whistling is an example of pursed lips. 22. If your doctor has told you to take more than 1 puff, wait at least 15-30 seconds between puffs. This will help you get the best results from your medicine. Do not use the inhaler more than your doctor tells you to. 23. Put the cap back on the inhaler. 24. Follow the directions from your doctor or from the inhaler package about cleaning the inhaler. If you use more than one inhaler, ask your doctor which inhalers to use and what order to use them in. Ask your doctor to help you figure out when you will need to refill your inhaler.  If you use a steroid inhaler, always rinse your mouth with water after your last puff, gargle and spit out the water. Do not swallow the water. GET HELP IF:  The inhaler medicine only partially helps to stop wheezing or shortness of breath.  You are having trouble using your inhaler.  You have some increase in thick spit (phlegm). GET HELP RIGHT  AWAY IF:  The inhaler medicine does not help your wheezing or shortness of breath or you have tightness in your chest.  You have dizziness, headaches, or fast heart rate.  You have chills, fever, or night sweats.  You have a large increase of thick spit, or your thick spit is bloody. MAKE SURE YOU:   Understand these instructions.  Will watch your condition.  Will get help right away if you are not doing well or get worse.   This information is not intended to replace advice given to you by your health care provider. Make sure you discuss any questions you have with your health care provider.   Document Released: 03/21/2008 Document Revised: 04/02/2013 Document Reviewed: 01/09/2013 Elsevier Interactive Patient Education [employer].

## 2015-10-11 NOTE — ED Notes (Signed)
Daughter requesting to talk with EDP.  EDP notified.

## 2015-10-11 NOTE — ED Provider Notes (Signed)
[facility] Emergency Department Provider Note  Time seen: 9:14 PM  I have reviewed the triage vital signs and the nursing notes.   HISTORY  Chief Complaint Shortness of Breath    HPI Tiffany Shaffer is a 82 y.o. female with a past medical history patient fibrillation who presents the emergency department with shortness of breath. According to the patient for the past 3 days she has been feeling increasingly short of breath with cough. Occasional white sputum. Denies any fever. Denies any diaphoresis. States occasional nausea but denies any vomiting. States mild pedal edema bilaterally which is chronic for her. Denies any increased lower extremity swelling. States a history of chronic atrial fibrillation denies other medical problems. Denies any history of COPD or CHF. Denies any chest pain. States she has an appointment to see her doctor tomorrow but her shortness of breath worsen so she came to the emergency department today.     Past Medical History  Diagnosis Date  . Atrial fibrillation ([place])     There are no active problems to display for this patient.   Past Surgical History  Procedure Laterality Date  . Breast biopsy Left     neg  . Breast biopsy Right     neg    No current outpatient prescriptions on file.  Allergies Review of patient's allergies indicates no known allergies.  No family history on file.  Social History Social History  Substance Use Topics  . Smoking status: Never Smoker   . Smokeless tobacco: None  . Alcohol Use: None    Review of Systems Constitutional: Negative for fever. Cardiovascular: Negative for chest pain. Respiratory: Positive for shortness of breath. Gastrointestinal: Negative for abdominal pain Musculoskeletal: Negative for back pain. Neurological: Negative for headache 10-point ROS otherwise negative.  ____________________________________________   PHYSICAL EXAM:  Constitutional: Alert and  oriented. Well appearing and in no distress. Eyes: Normal exam ENT   Head: Normocephalic and atraumatic.   Mouth/Throat: Mucous membranes are moist. Cardiovascular: Irregular rhythm and rate around 80 bpm. Respiratory: Patient has moderate expiratory wheeze bilaterally. No rales or rhonchi. Gastrointestinal: Soft and nontender. No distention.   Musculoskeletal: Nontender with normal range of motion in all extremities. Mild pedal edema bilaterally. No calf tenderness. Neurologic:  Normal speech and language. No gross focal neurologic deficits Skin:  Skin is warm, dry and intact.  Psychiatric: Mood and affect are normal. Speech and behavior are normal.  ____________________________________________    EKG  EKG reviewed and interpreted, so shows atrial fibrillation 114 bpm, slightly widened QRS, normal axis, nonspecific ST changes. Our*pattern most consistent with right bundle branch block.  ____________________________________________    RADIOLOGY  Chest x-ray shows cardiomegaly, mild vascular congestion no pulmonary edema. No infiltrate.  ____________________________________________    INITIAL IMPRESSION / ASSESSMENT AND PLAN / ED COURSE  Pertinent labs & imaging results that were available during my care of the patient were reviewed by me and considered in my medical decision making (see chart for details).  Patient presents the emergency department 3 days of shortness of breath and cough. Patient has moderate expiratory wheeze bilaterally on exam. Denies any history of COPD. Does not wear oxygen at home. Patient has a Room air oxygen saturation 95%. We'll treat with DuoNeb's, obtain lab work including a troponin, chest x-ray. We will treat with IV Solu-Medrol, and monitor for improvement while awaiting lab results.  Patient states she is feeling much better after breathing treatments. Appears much improved, no audible wheeze. Patient's labs have  resulted largely within  normal limits including a negative troponin. Patient's chest x-ray does not appear to show any acute abnormality. Highly suspect reactive airway disease versus COPD. Patient has no smoking history. We will discharge the patient with a course of steroids, as well as an albuterol inhaler. Patient will follow-up with her primary care physician in the next 2-3 days for recheck. I discussed strict return precautions for any trouble breathing, chest pain, or fever. Patient agreeable. ____________________________________________   FINAL CLINICAL IMPRESSION(S) / ED DIAGNOSES  Dyspnea Cough Reactive airway disease  Harvest Dark, MD 10/11/15 2325

## 2015-10-12 NOTE — ED Notes (Signed)
Pt discharged to home.  Family member driving.  Discharge instructions reviewed.  Verbalized understanding.  No questions or concerns at this time.  Teach back verified.  Pt in NAD.  No items left in ED.   

## 2015-10-25 DIAGNOSIS — R7302 Impaired glucose tolerance (oral): Secondary | ICD-10-CM | POA: Insufficient documentation

## 2015-10-27 ENCOUNTER — Other Ambulatory Visit: Payer: Self-pay | Admitting: Family Medicine

## 2015-10-27 DIAGNOSIS — Z1231 Encounter for screening mammogram for malignant neoplasm of breast: Secondary | ICD-10-CM

## 2015-11-17 ENCOUNTER — Ambulatory Visit
Admission: RE | Admit: 2015-11-17 | Discharge: 2015-11-17 | Disposition: A | Discharge status: Home / Self Care | Payer: Medicare Other | Source: Ambulatory Visit | Attending: Family Medicine | Admitting: Family Medicine

## 2015-11-17 DIAGNOSIS — Z1231 Encounter for screening mammogram for malignant neoplasm of breast: Secondary | ICD-10-CM | POA: Insufficient documentation

## 2015-12-18 ENCOUNTER — Emergency Department: Payer: Medicare Other

## 2015-12-18 ENCOUNTER — Encounter: Payer: Self-pay | Admitting: Emergency Medicine

## 2015-12-18 ENCOUNTER — Emergency Department
Admission: EM | Admit: 2015-12-18 | Discharge: 2015-12-18 | Disposition: A | Discharge status: Home / Self Care | Payer: Medicare Other | Attending: Emergency Medicine | Admitting: Emergency Medicine

## 2015-12-18 ENCOUNTER — Other Ambulatory Visit: Payer: Self-pay

## 2015-12-18 DIAGNOSIS — I4891 Unspecified atrial fibrillation: Secondary | ICD-10-CM | POA: Diagnosis not present

## 2015-12-18 DIAGNOSIS — Z79899 Other long term (current) drug therapy: Secondary | ICD-10-CM | POA: Insufficient documentation

## 2015-12-18 DIAGNOSIS — K8020 Calculus of gallbladder without cholecystitis without obstruction: Secondary | ICD-10-CM | POA: Insufficient documentation

## 2015-12-18 DIAGNOSIS — R1013 Epigastric pain: Secondary | ICD-10-CM

## 2015-12-18 HISTORY — DX: Supraventricular tachycardia: I47.1

## 2015-12-18 HISTORY — DX: Supraventricular tachycardia, unspecified: I47.10

## 2015-12-18 LAB — COMPREHENSIVE METABOLIC PANEL
ALBUMIN: 4 g/dL (ref 3.5–5.0)
ALT: 18 U/L (ref 14–54)
ANION GAP: 8 (ref 5–15)
AST: 22 U/L (ref 15–41)
Alkaline Phosphatase: 54 U/L (ref 38–126)
BILIRUBIN TOTAL: 1.3 mg/dL — AB (ref 0.3–1.2)
BUN: 11 mg/dL (ref 6–20)
CHLORIDE: 100 mmol/L — AB (ref 101–111)
CO2: 26 mmol/L (ref 22–32)
Calcium: 9.1 mg/dL (ref 8.9–10.3)
Creatinine, Ser: 0.8 mg/dL (ref 0.44–1.00)
GFR calc Af Amer: >60 mL/min (ref >60)
GLUCOSE: 136 mg/dL — AB (ref 65–99)
POTASSIUM: 3.9 mmol/L (ref 3.5–5.1)
Sodium: 134 mmol/L — ABNORMAL LOW (ref 135–145)
TOTAL PROTEIN: 6.8 g/dL (ref 6.5–8.1)

## 2015-12-18 LAB — URINALYSIS COMPLETE WITH MICROSCOPIC (ARMC ONLY)
Bilirubin Urine: NEGATIVE
Glucose, UA: NEGATIVE mg/dL
Ketones, ur: NEGATIVE mg/dL
NITRITE: NEGATIVE
PH: 5 (ref 5.0–8.0)
PROTEIN: NEGATIVE mg/dL
SPECIFIC GRAVITY, URINE: 1.01 (ref 1.005–1.030)

## 2015-12-18 LAB — CBC
HEMATOCRIT: 44 % (ref 35.0–47.0)
HEMOGLOBIN: 15.5 g/dL (ref 12.0–16.0)
MCH: 34.2 pg — ABNORMAL HIGH (ref 26.0–34.0)
MCHC: 35.2 g/dL (ref 32.0–36.0)
MCV: 97.3 fL (ref 80.0–100.0)
Platelets: 134 10*3/uL — ABNORMAL LOW (ref 150–440)
RBC: 4.52 MIL/uL (ref 3.80–5.20)
RDW: 13.9 % (ref 11.5–14.5)
WBC: 9 10*3/uL (ref 3.6–11.0)

## 2015-12-18 LAB — LIPASE, BLOOD: LIPASE: 21 U/L (ref 11–51)

## 2015-12-18 LAB — TROPONIN I: Troponin I: <0.03 ng/mL (ref <0.031)

## 2015-12-18 NOTE — ED Notes (Signed)
Pt resting in bed Awake. Family at bedside.

## 2015-12-18 NOTE — ED Provider Notes (Signed)
[facility] Emergency Department Provider Note   ____________________________________________  Time seen: Approximately [id number] AM  I have reviewed the triage vital signs and the nursing notes.   HISTORY  Chief Complaint Abdominal Pain    HPI Tiffany Shaffer is a 82 y.o. female who comes into the hospital today with some abdominal pain. The patient reports that she felt as though she had a knot in his stomach that would not stop hurting. She reports that she will get clammy and she will get nauseous. The patient reports that the pain started around 3-4 PM yesterday. It continued to bother her and get worse all day long. She did not take anything for pain as she was unsure what to take.The patient reports that this is epigastric pain that radiated to her left. She reports that she felt funny the day before the pain started. She reports that currently the pain is gone. She did not receive anything for the pain. She reports that she's had colon problems before but never this. The patient has had some shortness of breath with no chest pain but reports that she typically has shortness of breath. She was concerned that this was due to her heart so she decided to come into the hospital. The patient reports that she drinks wine daily. She is here for evaluation.   Past Medical History  Diagnosis Date  . Atrial fibrillation ([place])   . SVT (supraventricular tachycardia) ([facility])     There are no active problems to display for this patient.   Past Surgical History  Procedure Laterality Date  . Breast biopsy Left     neg  . Breast biopsy Right     neg  . Abdominal hysterectomy      Current Outpatient Rx  Name  Route  Sig  Dispense  Refill  . albuterol (PROVENTIL HFA;VENTOLIN HFA) 108 (90 Base) MCG/ACT inhaler   Inhalation   Inhale 2 puffs into the lungs every 6 (six) hours as needed for wheezing or shortness of breath.   1 Inhaler   0   . glucosamine-chondroitin  500-400 MG tablet   Oral   Take 1 tablet by mouth daily.         . metoprolol tartrate (LOPRESSOR) 25 MG tablet   Oral   Take 75 mg by mouth 2 (two) times daily.         . pantoprazole (PROTONIX) 40 MG tablet   Oral   Take 40 mg by mouth daily.         . predniSONE (DELTASONE) 20 MG tablet   Oral   Take 2 tablets (40 mg total) by mouth daily.   10 tablet   0   . rivaroxaban (XARELTO) 20 MG TABS tablet   Oral   Take 20 mg by mouth daily with supper.           Allergies Review of patient's allergies indicates no known allergies.  Family History  Problem Relation Age of Onset  . Breast cancer Neg Hx     Social History Social History  Substance Use Topics  . Smoking status: Never Smoker   . Smokeless tobacco: Never Used  . Alcohol Use: Yes    Review of Systems Constitutional: No fever/chills Eyes: No visual changes. ENT: No sore throat. Cardiovascular: Denies chest pain. Respiratory: Denies shortness of breath. Gastrointestinal: Epigastric pain  No nausea, no vomiting.  No diarrhea.  No constipation. Genitourinary: Negative for dysuria. Musculoskeletal: Negative for back pain.  Skin: Negative for rash. Neurological: Negative for headaches, focal weakness or numbness.  10-point ROS otherwise negative.  ____________________________________________   PHYSICAL EXAM:  VITAL SIGNS: ED Triage Vitals  Enc Vitals Group     BP 12/18/15 0636 146/96 mmHg     Pulse Rate 12/18/15 0636 83     Resp 12/18/15 0636 18     Temp --      Temp src --      SpO2 12/18/15 0314 94 %     Weight --      Height --      Head Cir --      Peak Flow --      Pain Score 12/18/15 0636 0     Pain Loc --      Pain Edu? --      Excl. in [place]? --     Constitutional: Alert and oriented. Well appearing and in no distress. Eyes: Conjunctivae are normal. PERRL. EOMI. Head: Atraumatic. Nose: No congestion/rhinnorhea. Mouth/Throat: Mucous membranes are moist.  Oropharynx  non-erythematous. Cardiovascular: Normal rate, regular rhythm. Grossly normal heart sounds.  Good peripheral circulation. Respiratory: Normal respiratory effort.  No retractions. Lungs CTAB. Gastrointestinal: Soft and nontender. No distention. Positive bowel sounds Musculoskeletal: No lower extremity tenderness nor edema.  . Neurologic:  Normal speech and language. No gross focal neurologic deficits are appreciated.  Skin:  Skin is warm, dry and intact. Marland Kitchen Psychiatric: Mood and affect are normal.   ____________________________________________   LABS (all labs ordered are listed, but only abnormal results are displayed)  Labs Reviewed  COMPREHENSIVE METABOLIC PANEL - Abnormal; Notable for the following:    Sodium 134 (*)    Chloride 100 (*)    Glucose, Bld 136 (*)    Total Bilirubin 1.3 (*)    All other components within normal limits  CBC - Abnormal; Notable for the following:    MCH 34.2 (*)    Platelets 134 (*)    All other components within normal limits  URINALYSIS COMPLETEWITH MICROSCOPIC ([facility] ONLY) - Abnormal; Notable for the following:    Color, Urine YELLOW (*)    APPearance CLEAR (*)    Hgb urine dipstick 1+ (*)    Leukocytes, UA TRACE (*)    Bacteria, UA RARE (*)    Squamous Epithelial / LPF 0-5 (*)    All other components within normal limits  URINE CULTURE  LIPASE, BLOOD  TROPONIN I  TROPONIN I   ____________________________________________  EKG  ED ECG REPORT I, Loney Hering, the attending physician, personally viewed and interpreted this ECG.   Date: 12/18/2015  EKG Time: 332  Rate: 95  Rhythm: atrial fibrillation, rate, RBBB  Axis: normal  Intervals:right bundle branch block  ST&T Change: Flipped T waves in leads III, aVF, V2, V3, V4, V5, V6  ____________________________________________  RADIOLOGY  CXR and Abd xray: Stable mild cardiomegaly without pulmonary edema, no active pulmonary disease. Nonobstructive bowel gas pattern,  cholelithiasis. ____________________________________________   PROCEDURES  Procedure(s) performed: None  Critical Care performed: No  ____________________________________________   INITIAL IMPRESSION / ASSESSMENT AND PLAN / ED COURSE  Pertinent labs & imaging results that were available during my care of the patient were reviewed by me and considered in my medical decision making (see chart for details).  This is an 82 year old female who comes into the hospital today with some epigastric pain. The patient reports that the pain was going on most of the day but by the time she arrived to  the emergency department the pain was gone. The patient's blood work is unremarkable at this time. I will repeat the patient's troponin and if it is negative I will discharge the patient home.  The patient's care was signed out to Dr. Reita Cliche who will follow-up the results of the repeat troponin and disposition the patient. The patient's pain is gone at this time. I am unsure of the patient's pain is due to the gallstones that were found on the x-ray or another cause. I will have the patient follow-up with her primary care physician and consider an elective cholecystectomy. ____________________________________________   FINAL CLINICAL IMPRESSION(S) / ED DIAGNOSES  Final diagnoses:  Epigastric pain      NEW MEDICATIONS STARTED DURING THIS VISIT:  New Prescriptions   No medications on file     Note:  This document was prepared using Dragon voice recognition software and may include unintentional dictation errors.    Loney Hering, MD 12/18/15 0830

## 2015-12-18 NOTE — ED Notes (Signed)
Pt presents to ED with abd pain that radiates around to her left side since around 1600 yesterday. Arrived by [facility] after she finished playing cards with her friends and her pain had not improved. +nausea.

## 2015-12-18 NOTE — ED Provider Notes (Signed)
Patient care was signed out to me by Dr. Dahlia Client at shift change this morning, this elderly patient who had epigastric discomfort and came in asymptomatic. Given the location, a repeat troponin was ordered for me to follow-up from Dr. Dahlia Client at shift change. Repeat troponin was negative.  Patient will be discharged home with return precautions and follow-up instructions for primary care and also surgery for the finding of gallstones on CT scan.  Lisa Roca, MD 12/18/15 [phone]

## 2015-12-18 NOTE — Discharge Instructions (Signed)
Return to emergency department for any worsening condition including abdominal pain, vomiting, black or bloody stools, dizziness or passing out, chest pain, trouble breathing, or any other symptoms concerning to you.   Abdominal Pain, Adult Many things can cause belly (abdominal) pain. Most times, the belly pain is not dangerous. Many cases of belly pain can be watched and treated at home. HOME CARE   Do not take medicines that help you go poop (laxatives) unless told to by your doctor.  Only take medicine as told by your doctor.  Eat or drink as told by your doctor. Your doctor will tell you if you should be on a special diet. GET HELP IF:  You do not know what is causing your belly pain.  You have belly pain while you are sick to your stomach (nauseous) or have runny poop (diarrhea).  You have pain while you pee or poop.  Your belly pain wakes you up at night.  You have belly pain that gets worse or better when you eat.  You have belly pain that gets worse when you eat fatty foods.  You have a fever. GET HELP RIGHT AWAY IF:   The pain does not go away within 2 hours.  You keep throwing up (vomiting).  The pain changes and is only in the right or left part of the belly.  You have bloody or tarry looking poop. MAKE SURE YOU:   Understand these instructions.  Will watch your condition.  Will get help right away if you are not doing well or get worse.   This information is not intended to replace advice given to you by your health care provider. Make sure you discuss any questions you have with your health care provider.   Document Released: 11/29/2007 Document Revised: 07/03/2014 Document Reviewed: 02/19/2013 Elsevier Interactive Patient Education 2016 [employer].   Cholelithiasis Cholelithiasis (also called gallstones) is a form of gallbladder disease. The gallbladder is a small organ that helps you digest fats. Symptoms of gallstones are:  Feeling sick to your  stomach (nausea).  Throwing up (vomiting).  Belly pain.  Yellowing of the skin (jaundice).  Sudden pain. You may feel the pain for minutes to hours.  Fever.  Pain to the touch. HOME CARE  Only take medicines as told by your doctor.  Eat a low-fat diet until you see your doctor again. Eating fat can result in pain.  Follow up with your doctor as told. Attacks usually happen time after time. Surgery is usually needed for permanent treatment. GET HELP RIGHT AWAY IF:   Your pain gets worse.  Your pain is not helped by medicines.  You have a fever and lasting symptoms for more than 2-3 days.  You have a fever and your symptoms suddenly get worse.  You keep feeling sick to your stomach and throwing up. MAKE SURE YOU:   Understand these instructions.  Will watch your condition.  Will get help right away if you are not doing well or get worse.   This information is not intended to replace advice given to you by your health care provider. Make sure you discuss any questions you have with your health care provider.   Document Released: 11/29/2007 Document Revised: 02/12/2013 Document Reviewed: 12/04/2012 Elsevier Interactive Patient Education [employer].

## 2015-12-19 LAB — URINE CULTURE

## 2016-05-26 ENCOUNTER — Ambulatory Visit (INDEPENDENT_AMBULATORY_CARE_PROVIDER_SITE_OTHER): Payer: Self-pay | Admitting: Vascular Surgery

## 2016-05-26 ENCOUNTER — Encounter (INDEPENDENT_AMBULATORY_CARE_PROVIDER_SITE_OTHER): Payer: Self-pay

## 2016-06-23 ENCOUNTER — Other Ambulatory Visit (INDEPENDENT_AMBULATORY_CARE_PROVIDER_SITE_OTHER): Payer: Self-pay | Admitting: Vascular Surgery

## 2016-06-23 ENCOUNTER — Ambulatory Visit (INDEPENDENT_AMBULATORY_CARE_PROVIDER_SITE_OTHER): Payer: Medicare Other | Admitting: Vascular Surgery

## 2016-06-23 ENCOUNTER — Ambulatory Visit (INDEPENDENT_AMBULATORY_CARE_PROVIDER_SITE_OTHER): Payer: Medicare Other

## 2016-06-23 ENCOUNTER — Encounter (INDEPENDENT_AMBULATORY_CARE_PROVIDER_SITE_OTHER): Payer: Self-pay | Admitting: Vascular Surgery

## 2016-06-23 VITALS — BP 131/70 | HR 77 | Resp 16 | Ht 65.0 in | Wt 187.0 lb

## 2016-06-23 DIAGNOSIS — I739 Peripheral vascular disease, unspecified: Secondary | ICD-10-CM | POA: Diagnosis not present

## 2016-06-23 DIAGNOSIS — I10 Essential (primary) hypertension: Secondary | ICD-10-CM | POA: Diagnosis not present

## 2016-06-23 DIAGNOSIS — I4891 Unspecified atrial fibrillation: Secondary | ICD-10-CM | POA: Diagnosis not present

## 2016-06-23 NOTE — Assessment & Plan Note (Signed)
blood pressure control important in reducing the progression of atherosclerotic disease. On appropriate oral medications.  

## 2016-06-23 NOTE — Assessment & Plan Note (Signed)
She is almost 3 years status post intervention for left lower extremity arterial disease. She is doing well without any current limb threatening symptoms were lifestyle limiting claudication. I will plan to check her again in 1 year. Her ABIs today demonstrate normal perfusion and waveforms with an ABI of 1.1 on the right and 0.92 on the left with normal digital pressures.

## 2016-06-23 NOTE — Progress Notes (Signed)
MRN : [id number]  Tiffany Shaffer is a 82 y.o. (1932/05/17) female who presents with chief complaint of  Chief Complaint  Patient presents with  . Follow-up  .  History of Present Illness: Patient returns today in follow up of PVD. She is about 3 years status post left lower extremity revascularization. She is doing well without any real lifestyle limiting claudication, ischemic rest pain, or ulceration. Her ABIs today demonstrate normal perfusion and waveforms with an ABI of 1.1 on the right and 0.92 on the left with normal digital pressures.  Current Outpatient Prescriptions  Medication Sig Dispense Refill  . albuterol (PROVENTIL HFA;VENTOLIN HFA) 108 (90 Base) MCG/ACT inhaler Inhale 2 puffs into the lungs every 6 (six) hours as needed for wheezing or shortness of breath. 1 Inhaler 0  . chlorthalidone (HYGROTON) 25 MG tablet     . fluticasone (FLONASE) 50 MCG/ACT nasal spray Place into the nose.    Marland Kitchen glucosamine-chondroitin 500-400 MG tablet Take 1 tablet by mouth daily.    Marland Kitchen levocetirizine (XYZAL) 5 MG tablet Take by mouth.    . metoprolol tartrate (LOPRESSOR) 25 MG tablet Take 75 mg by mouth 2 (two) times daily.    . pantoprazole (PROTONIX) 40 MG tablet Take 40 mg by mouth daily.    . rivaroxaban (XARELTO) 20 MG TABS tablet Take 20 mg by mouth daily with supper.    . predniSONE (DELTASONE) 20 MG tablet Take 2 tablets (40 mg total) by mouth daily. (Patient not taking: Reported on 06/23/2016) 10 tablet 0   No current facility-administered medications for this visit.     Past Medical History:  Diagnosis Date  . Atrial fibrillation ([place])   . SVT (supraventricular tachycardia) ([facility])     Past Surgical History:  Procedure Laterality Date  . ABDOMINAL HYSTERECTOMY    . BREAST BIOPSY Left    neg  . BREAST BIOPSY Right    neg    Social History Social History  Substance Use Topics  . Smoking status: Never Smoker  . Smokeless tobacco: Never Used  . Alcohol use Yes     Family History Family History  Problem Relation Age of Onset  . Breast cancer Neg Hx     Allergies  Allergen Reactions  . Pravastatin Other (See Comments)     REVIEW OF SYSTEMS (Negative unless checked)  Constitutional: [] Weight loss  [] Fever  [] Chills Cardiac: [] Chest pain   [] Chest pressure   [x] Palpitations   [] Shortness of breath when laying flat   [] Shortness of breath at rest   [] Shortness of breath with exertion. Vascular:  [] Pain in legs with walking   [] Pain in legs at rest   [] Pain in legs when laying flat   [] Claudication   [] Pain in feet when walking  [] Pain in feet at rest  [] Pain in feet when laying flat   [] History of DVT   [] Phlebitis   [] Swelling in legs   [] Varicose veins   [] Non-healing ulcers Pulmonary:   [] Uses home oxygen   [] Productive cough   [] Hemoptysis   [] Wheeze  [] COPD   [] Asthma Neurologic:  [] Dizziness  [] Blackouts   [] Seizures   [] History of stroke   [] History of TIA  [] Aphasia   [] Temporary blindness   [] Dysphagia   [] Weakness or numbness in arms   [] Weakness or numbness in legs Musculoskeletal:  [] Arthritis   [] Joint swelling   [] Joint pain   [] Low back pain Hematologic:  [] Easy bruising  [] Easy bleeding   [] Hypercoagulable state   []   Anemic   Gastrointestinal:  [] Blood in stool   [] Vomiting blood  [] Gastroesophageal reflux/heartburn   [] Abdominal pain Genitourinary:  [] Chronic kidney disease   [] Difficult urination  [] Frequent urination  [] Burning with urination   [] Hematuria Skin:  [] Rashes   [] Ulcers   [] Wounds Psychological:  [] History of anxiety   []  History of major depression.  Physical Examination  BP 131/70   Pulse 77   Resp 16   Ht 5\' 5"  (1.651 m)   Wt 187 lb (84.8 kg)   BMI 31.12 kg/m  Gen:  WD/WN, NAD. Appears younger than stated age Head: [place]/AT, No temporalis wasting. Ear/Nose/Throat: Hearing grossly intact, nares w/o erythema or drainage, trachea midline Eyes: Conjunctiva clear. Sclera non-icteric Neck: Supple.  No JVD.   Pulmonary:  Good air movement, no use of accessory muscles.  Cardiac: Irregularly irregular Vascular:  Vessel Right Left  Radial Palpable Palpable  Ulnar Palpable Palpable  Brachial Palpable Palpable  Carotid Palpable, without bruit Palpable, without bruit  Aorta Not palpable N/A  Femoral Palpable Palpable  Popliteal Palpable Palpable  PT Palpable Palpable  DP Palpable Palpable   Gastrointestinal: soft, non-tender/non-distended. No guarding/reflex.  Musculoskeletal: M/S 5/5 throughout.  No deformity or atrophy.  Neurologic: Sensation grossly intact in extremities.  Symmetrical.  Speech is fluent.  Psychiatric: Judgment intact, Mood & affect appropriate for pt's clinical situation. Dermatologic: No rashes or ulcers noted.  No cellulitis or open wounds. Lymph : No Cervical, Axillary, or Inguinal lymphadenopathy.      Labs No results found for this or any previous visit (from the past 2160 hour(s)).  Radiology No results found.    Assessment/Plan  Essential hypertension, benign blood pressure control important in reducing the progression of atherosclerotic disease. On appropriate oral medications.   Atrial fibrillation ([place]) On Xarelto  PVD (peripheral vascular disease) ([place]) She is almost 3 years status post intervention for left lower extremity arterial disease. She is doing well without any current limb threatening symptoms were lifestyle limiting claudication. I will plan to check her again in 1 year. Her ABIs today demonstrate normal perfusion and waveforms with an ABI of 1.1 on the right and 0.92 on the left with normal digital pressures.    Leotis Pain, MD  06/23/2016 4:09 PM    This note was created with Dragon medical transcription system.  Any errors from dictation are purely unintentional

## 2016-06-23 NOTE — Assessment & Plan Note (Signed)
On Xarelto 

## 2016-06-29 DIAGNOSIS — Z800 Family history of malignant neoplasm of digestive organs: Secondary | ICD-10-CM | POA: Insufficient documentation

## 2016-08-29 DIAGNOSIS — C801 Malignant (primary) neoplasm, unspecified: Secondary | ICD-10-CM

## 2016-08-29 HISTORY — DX: Malignant (primary) neoplasm, unspecified: C80.1

## 2016-11-13 ENCOUNTER — Other Ambulatory Visit: Payer: Self-pay | Admitting: Nurse Practitioner

## 2016-11-13 DIAGNOSIS — Z1231 Encounter for screening mammogram for malignant neoplasm of breast: Secondary | ICD-10-CM

## 2016-11-22 ENCOUNTER — Ambulatory Visit
Admission: RE | Admit: 2016-11-22 | Discharge: 2016-11-22 | Disposition: A | Discharge status: Home / Self Care | Payer: Medicare Other | Source: Ambulatory Visit | Attending: Nurse Practitioner | Admitting: Nurse Practitioner

## 2016-11-22 DIAGNOSIS — Z1231 Encounter for screening mammogram for malignant neoplasm of breast: Secondary | ICD-10-CM | POA: Insufficient documentation

## 2016-11-22 HISTORY — DX: Malignant (primary) neoplasm, unspecified: C80.1

## 2017-06-22 ENCOUNTER — Ambulatory Visit (INDEPENDENT_AMBULATORY_CARE_PROVIDER_SITE_OTHER): Payer: Medicare Other | Admitting: Vascular Surgery

## 2017-06-22 ENCOUNTER — Encounter (INDEPENDENT_AMBULATORY_CARE_PROVIDER_SITE_OTHER): Payer: Medicare Other

## 2017-06-22 ENCOUNTER — Encounter (INDEPENDENT_AMBULATORY_CARE_PROVIDER_SITE_OTHER): Payer: Self-pay | Admitting: Vascular Surgery

## 2017-06-22 ENCOUNTER — Encounter (INDEPENDENT_AMBULATORY_CARE_PROVIDER_SITE_OTHER): Payer: Self-pay

## 2017-06-22 ENCOUNTER — Ambulatory Visit (INDEPENDENT_AMBULATORY_CARE_PROVIDER_SITE_OTHER): Payer: Medicare Other

## 2017-06-22 VITALS — BP 151/80 | HR 81 | Resp 15 | Ht 65.0 in | Wt 188.0 lb

## 2017-06-22 DIAGNOSIS — I739 Peripheral vascular disease, unspecified: Secondary | ICD-10-CM | POA: Diagnosis not present

## 2017-06-22 DIAGNOSIS — I4891 Unspecified atrial fibrillation: Secondary | ICD-10-CM

## 2017-06-22 DIAGNOSIS — I10 Essential (primary) hypertension: Secondary | ICD-10-CM | POA: Diagnosis not present

## 2017-06-22 NOTE — Assessment & Plan Note (Signed)
Her ABIs are normal today with normal waveforms and digital pressures bilaterally consistent with no significant arterial insufficiency.  She is doing well.  Plan to recheck in 1 year.  Continue current medical regimen including Xarelto

## 2017-06-22 NOTE — Progress Notes (Signed)
MRN : [id number]  Tiffany Shaffer is a 83 y.o. ([birth year]) female who presents with chief complaint of  Chief Complaint  Patient presents with  . Follow-up    1 year ABI  .  History of Present Illness: Patient returns today in follow up of PAD.  She is 3-4 years status post intervention for claudication symptoms.  She has no current lifestyle limiting claudication, ischemic rest pain, or tissue loss.  Her ABIs are normal today with normal waveforms and digital pressures bilaterally consistent with no significant arterial insufficiency.  Current Outpatient Medications  Medication Sig Dispense Refill  . acetaminophen (TYLENOL) 500 MG tablet Take by mouth.    Marland Kitchen albuterol (PROVENTIL HFA;VENTOLIN HFA) 108 (90 Base) MCG/ACT inhaler Inhale 2 puffs into the lungs every 6 (six) hours as needed for wheezing or shortness of breath. 1 Inhaler 0  . chlorthalidone (HYGROTON) 25 MG tablet     . cyanocobalamin (,VITAMIN B-12,) 1000 MCG/ML injection Inject into the muscle.    Marland Kitchen glucosamine-chondroitin 500-400 MG tablet Take 1 tablet by mouth daily.    . metoprolol tartrate (LOPRESSOR) 25 MG tablet Take 75 mg by mouth 2 (two) times daily.    . pantoprazole (PROTONIX) 40 MG tablet Take 40 mg by mouth daily.    . predniSONE (DELTASONE) 20 MG tablet Take 2 tablets (40 mg total) by mouth daily. 10 tablet 0  . rivaroxaban (XARELTO) 20 MG TABS tablet Take 20 mg by mouth daily with supper.    . fluticasone (FLONASE) 50 MCG/ACT nasal spray Place into the nose.    . levocetirizine (XYZAL) 5 MG tablet Take by mouth.     No current facility-administered medications for this visit.     Past Medical History:  Diagnosis Date  . Atrial fibrillation ([place])   . Cancer ([place]) 08/29/2016   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  . SVT (supraventricular tachycardia) ([facility])     Past Surgical History:  Procedure Laterality Date  . ABDOMINAL HYSTERECTOMY    . BREAST BIOPSY Left    neg  . BREAST BIOPSY Right    neg     Social History     Social History  Substance Use Topics  . Smoking status: Never Smoker  . Smokeless tobacco: Never Used  . Alcohol use Yes    Family History      Family History  Problem Relation Age of Onset  . Breast cancer Neg Hx         Allergies  Allergen Reactions  . Pravastatin Other (See Comments)     REVIEW OF SYSTEMS (Negative unless checked)  Constitutional: [] Weight loss  [] Fever  [] Chills Cardiac: [] Chest pain   [] Chest pressure   [x] Palpitations   [] Shortness of breath when laying flat   [] Shortness of breath at rest   [] Shortness of breath with exertion. Vascular:  [] Pain in legs with walking   [] Pain in legs at rest   [] Pain in legs when laying flat   [] Claudication   [] Pain in feet when walking  [] Pain in feet at rest  [] Pain in feet when laying flat   [] History of DVT   [] Phlebitis   [] Swelling in legs   [] Varicose veins   [] Non-healing ulcers Pulmonary:   [] Uses home oxygen   [] Productive cough   [] Hemoptysis   [] Wheeze  [] COPD   [] Asthma Neurologic:  [] Dizziness  [] Blackouts   [] Seizures   [] History of stroke   [] History of TIA  [] Aphasia   [] Temporary blindness   []   Dysphagia   [] Weakness or numbness in arms   [] Weakness or numbness in legs Musculoskeletal:  [x] Arthritis   [] Joint swelling   [] Joint pain   [] Low back pain Hematologic:  [] Easy bruising  [] Easy bleeding   [] Hypercoagulable state   [] Anemic   Gastrointestinal:  [] Blood in stool   [] Vomiting blood  [] Gastroesophageal reflux/heartburn   [] Abdominal pain Genitourinary:  [] Chronic kidney disease   [] Difficult urination  [] Frequent urination  [] Burning with urination   [] Hematuria Skin:  [] Rashes   [] Ulcers   [] Wounds Psychological:  [] History of anxiety   []  History of major depression.     Physical Examination  BP (!) 151/80 (BP Location: Left Arm, Patient Position: Sitting)   Pulse 81   Resp 15   Ht 5\' 5"  (1.651 m)   Wt 85.3 kg (188 lb)   BMI 31.28 kg/m  Gen:  WD/WN, NAD.   Appears younger than stated age Head: [facility]/AT, No temporalis wasting. Ear/Nose/Throat: Hearing grossly intact, nares w/o erythema or drainage, trachea midline Eyes: Conjunctiva clear. Sclera non-icteric Neck: Supple.  No JVD.  Pulmonary:  Good air movement, no use of accessory muscles.  Cardiac: Irregularly irregular Vascular:  Vessel Right Left  Radial Palpable Palpable                          PT Palpable Palpable  DP Palpable Palpable    Musculoskeletal: M/S 5/5 throughout.  No deformity or atrophy.  No significant lower extremity edema. Neurologic: Sensation grossly intact in extremities.  Symmetrical.  Speech is fluent.  Psychiatric: Judgment intact, Mood & affect appropriate for pt's clinical situation. Dermatologic: No rashes or ulcers noted.  No cellulitis or open wounds.      Labs No results found for this or any previous visit (from the past 2160 hour(s)).  Radiology No results found.    Assessment/Plan Essential hypertension, benign blood pressure control important in reducing the progression of atherosclerotic disease. On appropriate oral medications.   Atrial fibrillation (HCC) On Xarelto   PVD (peripheral vascular disease) ([place]) Her ABIs are normal today with normal waveforms and digital pressures bilaterally consistent with no significant arterial insufficiency.  She is doing well.  Plan to recheck in 1 year.  Continue current medical regimen including Xarelto    Leotis Pain, MD  06/22/2017 3:18 PM    This note was created with Dragon medical transcription system.  Any errors from dictation are purely unintentional

## 2017-06-22 NOTE — Patient Instructions (Signed)

## 2017-07-02 ENCOUNTER — Other Ambulatory Visit: Payer: Self-pay

## 2017-07-02 ENCOUNTER — Encounter: Payer: Self-pay | Admitting: *Deleted

## 2017-07-06 NOTE — Discharge Instructions (Signed)
INSTRUCTIONS FOLLOWING OCULOPLASTIC SURGERY °AMY M. FOWLER, MD ° °AFTER YOUR EYE SURGERY, THER ARE MANY THINGS THWIHC YOU, THE PATIENT, CAN DO TO ASSURE THE BEST POSSIBLE RESULT FROM YOUR OPERATION.  THIS SHEET SHOULD BE REFERRED TO WHENEVER QUESTIONS ARISE.  IF THERE ARE ANY QUESTIONS NOT ANSWERED HERE, DO NOT HESITATE TO CALL OUR OFFICE AT [phone] OR [phone].  THERE IS ALWAYS [facility] AVAILABLE TO CALL IF QUESTIONS OR PROBLEMS ARISE. ° °VISION: Your vision may be blurred and out of focus after surgery until you are able to stop using your ointment, swelling resolves and your eye(s) heal. This may take 1 to 2 weeks at the least.  If your vision becomes gradually more dim or dark, this is not normal and you need to call our office immediately. ° °EYE CARE: For the first 48 hours after surgery, use ice packs frequently - “20 minutes on, 20 minutes off” - to help reduce swelling and bruising.  Small bags of frozen peas or corn make good ice packs along with cloths soaked in ice water.  If you are wearing a patch or other type of dressing following surgery, keep this on for the amount of time specified by your doctor.  For the first week following surgery, you will need to treat your stitches with great care.  If is OK to shower, but take care to not allow soapy water to run into your eye(s) to help reduce changes of infection.  You may gently clean the eyelashes and around the eye(s) with cotton balls and sterile water, BUT DO NOT RUB THE STITCHES VIGOROUSLY.  Keeping your stitches moist with ointment will help promote healing with minimal scar formation. ° °ACTIVITY: When you leave the surgery center, you should go home, rest and be inactive.  The eye(s) may feel scratchy and keeping the eyes closed will allow for faster healing.  The first week following surgery, avoid straining (anything making the face turn red) or lifting over 20 pounds.  Additionally, avoid bending which causes your head to go below  your waist.  Using your eyes will NOT harm them, so feel free to read, watch television, use the computer, etc as desired.  Driving depends on each individual, so check with your doctor if you have questions about driving. ° °MEDICATIONS:  You will be given a prescription for an ointment to use 4 times a day on your stitches.  You can use the ointment in your eyes if they feel scratchy or irritated.  If you eyelid(s) don’t close completely when you sleep, put some ointment in your eyes before bedtime. ° °EMERGENCY: If you experience SEVERE EYE PAIN OR HEADACHE UNRELIEVED BY TYLENOL OR PERCOCET, NAUSEA OR VOMITING, WORSENING REDNESS, OR WORSENING VISION (ESPECIALLY VISION THAT WA INITIALLY BETTER) CALL [phone] OR [phone] DURING BUSINESS HOURS OR AFTER HOURS. ° °General Anesthesia, Adult, Care After °These instructions provide you with information about caring for yourself after your procedure. Your health care provider may also give you more specific instructions. Your treatment has been planned according to current medical practices, but problems sometimes occur. Call your health care provider if you have any problems or questions after your procedure. °What can I expect after the procedure? °After the procedure, it is common to have: °· Vomiting. °· A sore throat. °· Mental slowness. ° °It is common to feel: °· Nauseous. °· Cold or shivery. °· Sleepy. °· Tired. °· Sore or achy, even in parts of your body where you did not have surgery. ° °  Follow these instructions at home: °For at least 24 hours after the procedure: °· Do not: °? Participate in activities where you could fall or become injured. °? Drive. °? Use heavy machinery. °? Drink alcohol. °? Take sleeping pills or medicines that cause drowsiness. °? Make important decisions or sign legal documents. °? Take care of children on your own. °· Rest. °Eating and drinking °· If you vomit, drink water, juice, or soup when you can drink without  vomiting. °· Drink enough fluid to keep your urine clear or pale yellow. °· Make sure you have little or no nausea before eating solid foods. °· Follow the diet recommended by your health care provider. °General instructions °· Have a responsible adult stay with you until you are awake and alert. °· Return to your normal activities as told by your health care provider. Ask your health care provider what activities are safe for you. °· Take over-the-counter and prescription medicines only as told by your health care provider. °· If you smoke, do not smoke without supervision. °· Keep all follow-up visits as told by your health care provider. This is important. °Contact a health care provider if: °· You continue to have nausea or vomiting at home, and medicines are not helpful. °· You cannot drink fluids or start eating again. °· You cannot urinate after 8-12 hours. °· You develop a skin rash. °· You have fever. °· You have increasing redness at the site of your procedure. °Get help right away if: °· You have difficulty breathing. °· You have chest pain. °· You have unexpected bleeding. °· You feel that you are having a life-threatening or urgent problem. °This information is not intended to replace advice given to you by your health care provider. Make sure you discuss any questions you have with your health care provider. °Document Released: 09/18/2000 Document Revised: 11/15/2015 Document Reviewed: 05/27/2015 °Elsevier Interactive Patient Education © 2018 [employer] ° °

## 2017-07-10 ENCOUNTER — Ambulatory Visit: Payer: Medicare Other | Admitting: Anesthesiology

## 2017-07-10 ENCOUNTER — Ambulatory Visit
Admission: RE | Admit: 2017-07-10 | Discharge: 2017-07-10 | Disposition: A | Discharge status: Home / Self Care | Payer: Medicare Other | Source: Ambulatory Visit | Attending: Ophthalmology | Admitting: Ophthalmology

## 2017-07-10 ENCOUNTER — Encounter
Admission: RE | Disposition: A | Discharge status: Home / Self Care | Payer: Self-pay | Source: Ambulatory Visit | Attending: Ophthalmology

## 2017-07-10 DIAGNOSIS — H02403 Unspecified ptosis of bilateral eyelids: Secondary | ICD-10-CM | POA: Diagnosis not present

## 2017-07-10 DIAGNOSIS — H02831 Dermatochalasis of right upper eyelid: Secondary | ICD-10-CM | POA: Diagnosis not present

## 2017-07-10 DIAGNOSIS — J449 Chronic obstructive pulmonary disease, unspecified: Secondary | ICD-10-CM | POA: Insufficient documentation

## 2017-07-10 DIAGNOSIS — H02834 Dermatochalasis of left upper eyelid: Secondary | ICD-10-CM | POA: Diagnosis not present

## 2017-07-10 DIAGNOSIS — I10 Essential (primary) hypertension: Secondary | ICD-10-CM | POA: Insufficient documentation

## 2017-07-10 HISTORY — DX: Headache, unspecified: R51.9

## 2017-07-10 HISTORY — PX: PTOSIS REPAIR: SHX6568

## 2017-07-10 HISTORY — PX: BROW LIFT: SHX178

## 2017-07-10 HISTORY — DX: Headache: R51

## 2017-07-10 HISTORY — DX: Presence of dental prosthetic device (complete) (partial): Z97.2

## 2017-07-10 HISTORY — DX: Personal history of other venous thrombosis and embolism: Z86.718

## 2017-07-10 SURGERY — BLEPHAROPLASTY
Anesthesia: General | Site: Eye | Laterality: Bilateral | Wound class: Clean

## 2017-07-10 MED ORDER — FENTANYL CITRATE (PF) 100 MCG/2ML IJ SOLN: 25.0000 ug | INTRAMUSCULAR | Status: DC | PRN

## 2017-07-10 MED ORDER — OXYCODONE HCL 5 MG PO TABS: 5.0000 mg | ORAL_TABLET | Freq: Once | ORAL | Status: DC | PRN

## 2017-07-10 MED ORDER — TETRACAINE HCL 0.5 % OP SOLN
OPHTHALMIC | Status: DC | PRN
  Administered 2017-07-10: 2 [drp] via OPHTHALMIC

## 2017-07-10 MED ORDER — OXYCODONE-ACETAMINOPHEN 5-325 MG PO TABS: 1.0000 | ORAL_TABLET | ORAL | 0 refills | Status: DC | PRN

## 2017-07-10 MED ORDER — BSS IO SOLN
INTRAOCULAR | Status: DC | PRN
  Administered 2017-07-10: 15 mL

## 2017-07-10 MED ORDER — PROMETHAZINE HCL 25 MG/ML IJ SOLN: 6.2500 mg | INTRAMUSCULAR | Status: DC | PRN

## 2017-07-10 MED ORDER — ALFENTANIL 500 MCG/ML IJ INJ
INJECTION | INTRAVENOUS | Status: DC | PRN
  Administered 2017-07-10: 1000 ug via INTRAVENOUS

## 2017-07-10 MED ORDER — MEPERIDINE HCL 25 MG/ML IJ SOLN: 6.2500 mg | INTRAMUSCULAR | Status: DC | PRN

## 2017-07-10 MED ORDER — ERYTHROMYCIN 5 MG/GM OP OINT: TOPICAL_OINTMENT | OPHTHALMIC | 3 refills | Status: DC

## 2017-07-10 MED ORDER — MIDAZOLAM HCL 2 MG/2ML IJ SOLN
INTRAMUSCULAR | Status: DC | PRN
  Administered 2017-07-10: 2 mg via INTRAVENOUS

## 2017-07-10 MED ORDER — ERYTHROMYCIN 5 MG/GM OP OINT
TOPICAL_OINTMENT | OPHTHALMIC | Status: DC | PRN
  Administered 2017-07-10: 1 via OPHTHALMIC

## 2017-07-10 MED ORDER — LACTATED RINGERS IV SOLN
10.0000 mL/h | INTRAVENOUS | Status: DC
  Administered 2017-07-10 (×2): via INTRAVENOUS

## 2017-07-10 MED ORDER — LACTATED RINGERS IV SOLN
10.0000 mL/h | INTRAVENOUS | Status: DC
  Administered 2017-07-10: 10 mL/h via INTRAVENOUS

## 2017-07-10 MED ORDER — HYALURONIDASE HUMAN 150 UNIT/ML IJ SOLN
INTRAMUSCULAR | Status: DC | PRN
  Administered 2017-07-10: 2 mL via OPHTHALMIC

## 2017-07-10 MED ORDER — OXYCODONE HCL 5 MG/5ML PO SOLN: 5.0000 mg | Freq: Once | ORAL | Status: DC | PRN

## 2017-07-10 MED ORDER — GLYCOPYRROLATE 0.2 MG/ML IJ SOLN
INTRAMUSCULAR | Status: DC | PRN
  Administered 2017-07-10: 0.1 mg via INTRAVENOUS

## 2017-07-10 SURGICAL SUPPLY — 36 items
APPLICATOR COTTON TIP WD 3 STR (MISCELLANEOUS) ×6 IMPLANT
BLADE SURG 15 STRL LF DISP TIS (BLADE) ×1 IMPLANT
BLADE SURG 15 STRL SS (BLADE) ×2
CORD BIP STRL DISP 12FT (MISCELLANEOUS) ×3 IMPLANT
DRAPE HEAD BAR (DRAPES) ×3 IMPLANT
GAUZE SPONGE 4X4 12PLY STRL (GAUZE/BANDAGES/DRESSINGS) ×3 IMPLANT
GAUZE SPONGE NON-WVN 2X2 STRL (MISCELLANEOUS) ×10 IMPLANT
GLOVE SURG LX 7.0 MICRO (GLOVE) ×6
GLOVE SURG LX STRL 7.0 MICRO (GLOVE) ×3 IMPLANT
MARKER SKIN XFINE TIP W/RULER (MISCELLANEOUS) ×3 IMPLANT
NEEDLE FILTER BLUNT 18X 1/2SAF (NEEDLE) ×2
NEEDLE FILTER BLUNT 18X1 1/2 (NEEDLE) ×1 IMPLANT
NEEDLE HYPO 30X.5 LL (NEEDLE) ×6 IMPLANT
PACK DRAPE NASAL/ENT (PACKS) ×3 IMPLANT
SOL PREP PVP 2OZ (MISCELLANEOUS) ×3
SOLUTION PREP PVP 2OZ (MISCELLANEOUS) ×1 IMPLANT
SPONGE VERSALON 2X2 STRL (MISCELLANEOUS) ×20
SUT CHROMIC 4-0 (SUTURE)
SUT CHROMIC 4-0 M2 12X2 ARM (SUTURE)
SUT CHROMIC 5 0 P 3 (SUTURE) IMPLANT
SUT ETHILON 4 0 CL P 3 (SUTURE) IMPLANT
SUT MERSILENE 4-0 S-2 (SUTURE) IMPLANT
SUT PDS AB 4-0 P3 18 (SUTURE) IMPLANT
SUT PLAIN GUT (SUTURE) ×3 IMPLANT
SUT PROLENE 5 0 P 3 (SUTURE) IMPLANT
SUT PROLENE 6 0 P 1 18 (SUTURE) ×6 IMPLANT
SUT SILK 4 0 G 3 (SUTURE) IMPLANT
SUT VIC AB 5-0 P-3 18X BRD (SUTURE) IMPLANT
SUT VIC AB 5-0 P3 18 (SUTURE)
SUT VICRYL 6-0  S14 CTD (SUTURE)
SUT VICRYL 6-0 S14 CTD (SUTURE) IMPLANT
SUT VICRYL 7 0 TG140 8 (SUTURE) IMPLANT
SUTURE CHRMC 4-0 M2 12X2 ARM (SUTURE) IMPLANT
SYR 3ML LL SCALE MARK (SYRINGE) ×3 IMPLANT
SYRINGE 10CC LL (SYRINGE) ×3 IMPLANT
WATER STERILE IRR 250ML POUR (IV SOLUTION) ×3 IMPLANT

## 2017-07-10 NOTE — Anesthesia Postprocedure Evaluation (Signed)
Anesthesia Post Note  Patient: Tiffany Shaffer  Procedure(s) Performed: BLEPHAROPLASTY  UPPER EYELID W EXCESS SKIN (Bilateral Eye) PTOSIS REPAIR RESECT EX (Bilateral Eye)  Patient location during evaluation: PACU Anesthesia Type: General Level of consciousness: awake and alert Pain management: pain level controlled Vital Signs Assessment: post-procedure vital signs reviewed and stable Respiratory status: spontaneous breathing, nonlabored ventilation, respiratory function stable and patient connected to nasal cannula oxygen Cardiovascular status: blood pressure returned to baseline and stable Postop Assessment: no apparent nausea or vomiting Anesthetic complications: no    Joyanne Eddinger ELAINE

## 2017-07-10 NOTE — H&P (Signed)
See the history and physical completed at [facility] on 06/21/17 and scanned into the chart.

## 2017-07-10 NOTE — Anesthesia Preprocedure Evaluation (Signed)
Anesthesia Evaluation  Patient identified by MRN, date of birth, ID band Patient awake    Reviewed: Allergy & Precautions, H&P , NPO status , Patient's Chart, lab work & pertinent test results, reviewed documented beta blocker date and time   Airway Mallampati: II  TM Distance: >3 FB Neck ROM: full    Dental no notable dental hx.    Pulmonary COPD,    Pulmonary exam normal breath sounds clear to auscultation       Cardiovascular Exercise Tolerance: Good hypertension, negative cardio ROS   Rhythm:regular Rate:Normal     Neuro/Psych  Headaches, negative psych ROS   GI/Hepatic negative GI ROS, Neg liver ROS,   Endo/Other  negative endocrine ROS  Renal/GU negative Renal ROS  negative genitourinary   Musculoskeletal   Abdominal   Peds  Hematology negative hematology ROS (+)   Anesthesia Other Findings   Reproductive/Obstetrics negative OB ROS                             Anesthesia Physical Anesthesia Plan  ASA: II  Anesthesia Plan: General   Post-op Pain Management:    Induction:   PONV Risk Score and Plan:   Airway Management Planned:   Additional Equipment:   Intra-op Plan:   Post-operative Plan:   Informed Consent: I have reviewed the patients History and Physical, chart, labs and discussed the procedure including the risks, benefits and alternatives for the proposed anesthesia with the patient or authorized representative who has indicated his/her understanding and acceptance.   Dental Advisory Given  Plan Discussed with: CRNA  Anesthesia Plan Comments:         Anesthesia Quick Evaluation

## 2017-07-10 NOTE — Transfer of Care (Signed)
Immediate Anesthesia Transfer of Care Note  Patient: Tiffany Shaffer  Procedure(s) Performed: BLEPHAROPLASTY  UPPER EYELID W EXCESS SKIN (Bilateral Eye) PTOSIS REPAIR RESECT EX (Bilateral Eye)  Patient Location: PACU  Anesthesia Type: General  Level of Consciousness: awake, alert  and patient cooperative  Airway and Oxygen Therapy: Patient Spontanous Breathing and Patient connected to supplemental oxygen  Post-op Assessment: Post-op Vital signs reviewed, Patient's Cardiovascular Status Stable, Respiratory Function Stable, Patent Airway and No signs of Nausea or vomiting  Post-op Vital Signs: Reviewed and stable  Complications: No apparent anesthesia complications

## 2017-07-10 NOTE — Anesthesia Procedure Notes (Signed)
Performed by: Lind Guest, CRNA Pre-anesthesia Checklist: Patient identified, Emergency Drugs available, Suction available, Timeout performed and Patient being monitored Patient Re-evaluated:Patient Re-evaluated prior to induction Oxygen Delivery Method: Nasal cannula Placement Confirmation: positive ETCO2

## 2017-07-10 NOTE — Interval H&P Note (Signed)
History and Physical Interval Note:  07/10/2017 8:55 AM  Tiffany Shaffer  has presented today for surgery, with the diagnosis of H02.831 H02.834 DERMATOCHALASIS H02.403 PTOSIS OF EYELID UNSPECIFIED  The various methods of treatment have been discussed with the patient and family. After consideration of risks, benefits and other options for treatment, the patient has consented to  Procedure(s): BLEPHAROPLASTY  UPPER EYELID W EXCESS SKIN (Bilateral) PTOSIS REPAIR RESECT EX (Bilateral) as a surgical intervention .  The patient's history has been reviewed, patient examined, no change in status, stable for surgery.  I have reviewed the patient's chart and labs.  Questions were answered to the patient's satisfaction.     [name], [name] M

## 2017-07-10 NOTE — Op Note (Signed)
Preoperative Diagnosis:  1. Visually significant blepharoptosis bilateral upper Eyelid(s) 2. Visually significant dermatochalasis bilateral upper Eyelid(s)  Postoperative Diagnosis:  Same.  Procedure(s) Performed:   1. Blepharoptosis repair with levator aponeurosis advancement bilateral upper Eyelid(s) 2. Upper eyelid blepharoplasty with excess skin excision bilateral upper Eyelid(s)  Surgeon: Philis Pique. [name], M.D.  Assistants: none  Anesthesia: MAC  Specimens: None.  Estimated Blood Loss: Minimal.  Complications: None.  Operative Findings: None Dictated  Procedure:   Allergies were reviewed and the patient Pravastatin.   After the risks, benefits, complications and alternatives were discussed with the patient, appropriate informed consent was obtained.  While seated in an upright position and looking in primary gaze, the mid pupillary line was marked on the upper eyelid margins bilaterally. The patient was then brought to the operating suite and reclined supine.  Timeout was conducted and the patient was sedated.  Local anesthetic consisting of a 50-50 mixture of 2% lidocaine with epinephrine and 0.75% bupivacaine with added Hylenex was injected subcutaneously to both upper eyelid(s). After adequate local was instilled, the patient was prepped and draped in the usual sterile fashion for eyelid surgery.   Attention was turned to the upper eyelids. A 75m upper eyelid crease incision line was marked with calipers on both upper eyelid(s).  A pinch test was used to estimate the amount of excess skin to remove and this was marked in standard blepharoplasty style fashion. Attention was turned to the right upper eyelid. A #15 blade was used to open the premarked incision line. A skin only flap was excised and hemostasis was obtained with bipolar cautery.   Westcott scissors were then used to transect through orbicularis down to the tarsal plate. Epitarsus was dissected to create a smooth  surface to suture to. Dissection was then carried superiorly in the plane between orbicularis and orbital septum. Once the preaponeurotic fat pocket was identified, the orbital septum was opened. This revealed the levator and its aponeurosis.    Attention was then turned to the opposite eyelid where the same procedure was performed in the same manner. Hemostasis was obtained with bipolar cautery throughout.   3 interrupted 6-0 Prolene sutures were then passed partial thickness through the tarsal plates of both upper eyelid(s). These sutures were placed in line with the mid pupillary, medial limbal, and lateral limbal lines. The sutures were fixed to the levator aponeurosis and adjusted until a nice lid height and contour were achieved. Once nice symmetry was achieved, the skin incisions were closed with a running 6-0 fast absorbing plain suture. The patient tolerated the procedure well.  Erythromycin ophthalmic ointment was applied to the incision site(s) followed by ice packs. The patient was taken to the recovery area where she recovered without difficulty.  Post-Op Plan/Instructions:  The patient was instructed to use ice packs frequently for the next 48 hours. She was instructed to use erythromycin ophthalmic ointment on her incisions 4 times a day for the next 12 to 14 days. Shewas given a prescription for Percocet for pain control should Tylenol not be effective. [facility] [place] [facility] [place] or sooner as needed for problems.  Amy M. FVickki Muff M.D. Attending,Ophthalmology

## 2017-07-14 ENCOUNTER — Emergency Department
Admission: EM | Admit: 2017-07-14 | Discharge: 2017-07-14 | Disposition: A | Discharge status: Home / Self Care | Payer: Medicare Other | Attending: Emergency Medicine | Admitting: Emergency Medicine

## 2017-07-14 ENCOUNTER — Emergency Department: Payer: Medicare Other

## 2017-07-14 ENCOUNTER — Other Ambulatory Visit: Payer: Self-pay

## 2017-07-14 DIAGNOSIS — M62838 Other muscle spasm: Secondary | ICD-10-CM

## 2017-07-14 DIAGNOSIS — M25511 Pain in right shoulder: Secondary | ICD-10-CM

## 2017-07-14 DIAGNOSIS — M79601 Pain in right arm: Secondary | ICD-10-CM | POA: Diagnosis present

## 2017-07-14 MED ORDER — TRAMADOL HCL 50 MG PO TABS
100.0000 mg | ORAL_TABLET | Freq: Once | ORAL | Status: AC
  Administered 2017-07-14: 100 mg via ORAL
  Filled 2017-07-14: qty 2

## 2017-07-14 MED ORDER — CYCLOBENZAPRINE HCL 10 MG PO TABS: 10.0000 mg | ORAL_TABLET | Freq: Three times a day (TID) | ORAL | 0 refills | Status: DC | PRN

## 2017-07-14 MED ORDER — TRAMADOL HCL 50 MG PO TABS: 50.0000 mg | ORAL_TABLET | Freq: Four times a day (QID) | ORAL | 0 refills | Status: DC | PRN

## 2017-07-14 MED ORDER — ACETAMINOPHEN 500 MG PO TABS
1000.0000 mg | ORAL_TABLET | Freq: Once | ORAL | Status: AC
  Administered 2017-07-14: 1000 mg via ORAL
  Filled 2017-07-14: qty 2

## 2017-07-14 NOTE — ED Notes (Signed)
Family at bedside. 

## 2017-07-14 NOTE — ED Notes (Signed)
Patient transported to X-ray 

## 2017-07-14 NOTE — ED Notes (Signed)
Patient transported to Ultrasound 

## 2017-07-14 NOTE — ED Notes (Signed)
ED Provider at bedside. 

## 2017-07-14 NOTE — ED Notes (Signed)
Pt states she has not yet taken her Metoprolol and will take her own dose of medication as instructed by Dr. Karma Greaser.

## 2017-07-14 NOTE — ED Provider Notes (Signed)
[facility]  I accepted care from Dr. Karma Greaser ____________________________________________    LABS (pertinent positives/negatives)  I, Lisa Roca, MD have personally reviewed the lab reports noted below.  Labs Reviewed - No data to display   ____________________________________________    RADIOLOGY All xrays were viewed by me. Imaging interpreted by radiologist.  I, Lisa Roca MD have personally reviewed the imaging report noted below.  Ultrasound right upper extremity:  IMPRESSION: No evidence of DVT within the right upper extremity.  ____________________________________________   PROCEDURES  Procedure(s) performed: None  Critical Care performed: None  ____________________________________________   INITIAL IMPRESSION / ASSESSMENT AND PLAN / ED COURSE   Pertinent labs & imaging results that were available during my care of the patient were reviewed by me and considered in my medical decision making (see chart for details).  I reviewed with patient and daughter reassuring x-ray and ultrasound.  On my physical exam she has muscle spasm over the trapezius muscle into the insertion areas.  We discussed that I think this is musculoskeletal in nature.  I was unable to check the [facility] drug database due to password issues.  Patient will be prescribed short course of tramadol because that seemed to help her here.  I am also going to try Flexeril muscle relaxer.  She was given sedative precautions.        Patient / Family / Caregiver informed of clinical course, medical decision-making process, and agree with plan.   I discussed return precautions, follow-up instructions, and discharged instructions with patient and/or family.  Discharge instructions:  You are evaluated for right shoulder pain, and although no certain cause was found, your exam and evaluation are overall reassuring in the emergency department today.  As we  discussed, I am most suspicious of muscle spasm of the trapezius causing her symptoms.  Please go ahead and continue Tylenol, use as directed on label every 4 hours.  You are being prescribed Flexeril muscle relaxer as needed, and tramadol for any as needed severe pain.  Both of these medications can cause sedation, please take several hours apart from each other and away from any other sedating medications or alcohol.  Return to the emergency department immediately for any worsening or uncontrolled pain, weakness, numbness, chest pain, trouble breathing, dizziness, passing out, or any other symptoms concerning to you.  I would recommend discussing with your primary care doctor the possibility of physical therapy.    ____________________________________________   FINAL CLINICAL IMPRESSION(S) / ED DIAGNOSES  Final diagnoses:  Pain in right shoulder  Trapezius muscle spasm        Lisa Roca, MD 07/14/17 1152

## 2017-07-14 NOTE — ED Triage Notes (Addendum)
Pt bib [facility] d/t right shoulder/arm pain that began approx 1200 last night. Pt attempt icyhot with no relief. Pt states woke up and her fitbit stated heart rate of 146. Pt has hx afib. Denies CP, SHOB.   Pt also recently had bilat eye surgery 2 days ago

## 2017-07-14 NOTE — Discharge Instructions (Signed)
You are evaluated for right shoulder pain, and although no certain cause was found, your exam and evaluation are overall reassuring in the emergency department today.  As we discussed, I am most suspicious of muscle spasm of the trapezius causing her symptoms.  Please go ahead and continue Tylenol, use as directed on label every 4 hours.  You are being prescribed Flexeril muscle relaxer as needed, and tramadol for any as needed severe pain.  Both of these medications can cause sedation, please take several hours apart from each other and away from any other sedating medications or alcohol.  Return to the emergency department immediately for any worsening or uncontrolled pain, weakness, numbness, chest pain, trouble breathing, dizziness, passing out, or any other symptoms concerning to you.  I would recommend discussing with your primary care doctor the possibility of physical therapy.

## 2017-07-14 NOTE — ED Notes (Signed)
Returned from [facility]

## 2017-07-19 ENCOUNTER — Telehealth (INDEPENDENT_AMBULATORY_CARE_PROVIDER_SITE_OTHER): Payer: Self-pay

## 2017-11-30 ENCOUNTER — Other Ambulatory Visit: Payer: Self-pay | Admitting: Nurse Practitioner

## 2017-11-30 DIAGNOSIS — Z1231 Encounter for screening mammogram for malignant neoplasm of breast: Secondary | ICD-10-CM

## 2017-12-11 ENCOUNTER — Ambulatory Visit
Admission: RE | Admit: 2017-12-11 | Discharge: 2017-12-11 | Disposition: A | Discharge status: Home / Self Care | Payer: Medicare Other | Source: Ambulatory Visit | Attending: Nurse Practitioner | Admitting: Nurse Practitioner

## 2017-12-11 ENCOUNTER — Ambulatory Visit: Payer: Medicare Other

## 2017-12-11 DIAGNOSIS — Z1231 Encounter for screening mammogram for malignant neoplasm of breast: Secondary | ICD-10-CM | POA: Diagnosis not present

## 2017-12-23 ENCOUNTER — Ambulatory Visit
Admission: EM | Admit: 2017-12-23 | Discharge: 2017-12-23 | Disposition: A | Discharge status: Home / Self Care | Payer: Medicare Other | Attending: Family Medicine | Admitting: Family Medicine

## 2017-12-23 ENCOUNTER — Encounter: Payer: Self-pay | Admitting: Gynecology

## 2017-12-23 ENCOUNTER — Ambulatory Visit (INDEPENDENT_AMBULATORY_CARE_PROVIDER_SITE_OTHER): Payer: Medicare Other

## 2017-12-23 ENCOUNTER — Other Ambulatory Visit: Payer: Self-pay

## 2017-12-23 DIAGNOSIS — Z888 Allergy status to other drugs, medicaments and biological substances status: Secondary | ICD-10-CM | POA: Diagnosis not present

## 2017-12-23 DIAGNOSIS — I10 Essential (primary) hypertension: Secondary | ICD-10-CM | POA: Insufficient documentation

## 2017-12-23 DIAGNOSIS — I4891 Unspecified atrial fibrillation: Secondary | ICD-10-CM | POA: Diagnosis not present

## 2017-12-23 DIAGNOSIS — Z90710 Acquired absence of both cervix and uterus: Secondary | ICD-10-CM | POA: Insufficient documentation

## 2017-12-23 DIAGNOSIS — Z7901 Long term (current) use of anticoagulants: Secondary | ICD-10-CM | POA: Insufficient documentation

## 2017-12-23 DIAGNOSIS — R1031 Right lower quadrant pain: Secondary | ICD-10-CM | POA: Diagnosis not present

## 2017-12-23 DIAGNOSIS — I739 Peripheral vascular disease, unspecified: Secondary | ICD-10-CM | POA: Diagnosis not present

## 2017-12-23 DIAGNOSIS — R197 Diarrhea, unspecified: Secondary | ICD-10-CM | POA: Insufficient documentation

## 2017-12-23 DIAGNOSIS — Z86718 Personal history of other venous thrombosis and embolism: Secondary | ICD-10-CM | POA: Diagnosis not present

## 2017-12-23 DIAGNOSIS — Z79899 Other long term (current) drug therapy: Secondary | ICD-10-CM | POA: Insufficient documentation

## 2017-12-23 DIAGNOSIS — R112 Nausea with vomiting, unspecified: Secondary | ICD-10-CM | POA: Insufficient documentation

## 2017-12-23 DIAGNOSIS — R109 Unspecified abdominal pain: Secondary | ICD-10-CM | POA: Diagnosis present

## 2017-12-23 LAB — COMPREHENSIVE METABOLIC PANEL
ALT: 21 U/L (ref 0–44)
ANION GAP: 13 (ref 5–15)
AST: 24 U/L (ref 15–41)
Albumin: 4.3 g/dL (ref 3.5–5.0)
Alkaline Phosphatase: 64 U/L (ref 38–126)
BILIRUBIN TOTAL: 1.2 mg/dL (ref 0.3–1.2)
BUN: 15 mg/dL (ref 8–23)
CO2: 28 mmol/L (ref 22–32)
Calcium: 9.1 mg/dL (ref 8.9–10.3)
Chloride: 88 mmol/L — ABNORMAL LOW (ref 98–111)
Creatinine, Ser: 0.91 mg/dL (ref 0.44–1.00)
GFR calc Af Amer: >60 mL/min (ref >60)
GFR, EST NON AFRICAN AMERICAN: 56 mL/min — AB (ref >60)
Glucose, Bld: 204 mg/dL — ABNORMAL HIGH (ref 70–99)
POTASSIUM: 3.2 mmol/L — AB (ref 3.5–5.1)
Sodium: 129 mmol/L — ABNORMAL LOW (ref 135–145)
TOTAL PROTEIN: 7.9 g/dL (ref 6.5–8.1)

## 2017-12-23 LAB — CBC WITH DIFFERENTIAL/PLATELET
BASOS ABS: 0 10*3/uL (ref 0–0.1)
Basophils Relative: 0 %
EOS PCT: 0 %
Eosinophils Absolute: 0 10*3/uL (ref 0–0.7)
HEMATOCRIT: 46.5 % (ref 35.0–47.0)
Hemoglobin: 16.3 g/dL — ABNORMAL HIGH (ref 12.0–16.0)
LYMPHS PCT: 7 %
Lymphs Abs: 0.7 10*3/uL — ABNORMAL LOW (ref 1.0–3.6)
MCH: 34.5 pg — ABNORMAL HIGH (ref 26.0–34.0)
MCHC: 35 g/dL (ref 32.0–36.0)
MCV: 98.6 fL (ref 80.0–100.0)
MONO ABS: 0.2 10*3/uL (ref 0.2–0.9)
MONOS PCT: 2 %
NEUTROS ABS: 9.2 10*3/uL — AB (ref 1.4–6.5)
Neutrophils Relative %: 91 %
Platelets: 164 10*3/uL (ref 150–440)
RBC: 4.72 MIL/uL (ref 3.80–5.20)
RDW: 13.4 % (ref 11.5–14.5)
WBC: 10.1 10*3/uL (ref 3.6–11.0)

## 2017-12-23 MED ORDER — SODIUM CHLORIDE 0.9 % IV BOLUS
1000.0000 mL | Freq: Once | INTRAVENOUS | Status: AC
  Administered 2017-12-23: 1000 mL via INTRAVENOUS

## 2017-12-23 MED ORDER — ONDANSETRON 4 MG PO TBDP: 4.0000 mg | ORAL_TABLET | Freq: Three times a day (TID) | ORAL | 0 refills | Status: DC | PRN

## 2017-12-23 MED ORDER — POTASSIUM CHLORIDE CRYS ER 20 MEQ PO TBCR: 40.0000 meq | EXTENDED_RELEASE_TABLET | Freq: Every day | ORAL | 0 refills | Status: DC

## 2017-12-23 MED ORDER — IOPAMIDOL (ISOVUE-300) INJECTION 61%
100.0000 mL | Freq: Once | INTRAVENOUS | Status: AC | PRN
  Administered 2017-12-23: 100 mL via INTRAVENOUS

## 2017-12-23 NOTE — ED Notes (Signed)
Pt drinking contrast. 

## 2017-12-23 NOTE — ED Triage Notes (Signed)
Per patient x 3 days ago slight right abdominal pain after eating. Patient stated last night after eating out at a cook out next door started to have diarrhea and later that night started to vomit. Per patient no diarrhea or vomiting since 4:30 am this morning

## 2017-12-23 NOTE — ED Provider Notes (Signed)
[facility]    CSN: [id number] Arrival date & time: 12/23/17  6967  History   Chief Complaint Chief Complaint  Patient presents with  . Emesis  . Abdominal Pain   HPI  84 year old female presents with abdominal pain, nausea, vomiting, diarrhea.  Patient states that over the past 3 days she has had sharp pain on the right side of her lower abdomen after eating.  She states that the pain is sharp.  Has quickly resolved until last night when it recurred.  Patient reports severe right lower quadrant pain last night.  She then subsequently developed watery diarrhea as well as nausea and vomiting.  Nausea and vomiting continued until this morning when it subsided.  She continues to have ongoing abdominal pain.  No fever.  No reported sick contacts.  She has not drank much.  Her daughter states that she drinks very little.  No known inciting factor.  No known exacerbating or relieving factors.  No other reported symptoms.  No other complaints.  Past Medical History:  Diagnosis Date  . Atrial fibrillation ([place])   . Cancer ([place]) 08/29/2016   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  . Headache    behind right eye  . History of blood clots 07/10/2013   Left lower leg  . SVT (supraventricular tachycardia) ([place])   . Wears dentures    full upper, p[artial lower    Patient Active Problem List   Diagnosis Date Noted  . Essential hypertension, benign 06/23/2016  . Atrial fibrillation ([place]) 06/23/2016  . PVD (peripheral vascular disease) ([place]) 06/23/2016    Past Surgical History:  Procedure Laterality Date  . ABDOMINAL HYSTERECTOMY    . BREAST BIOPSY Left    neg  . BREAST BIOPSY Right    neg  . BROW LIFT Bilateral 07/10/2017   Procedure: BLEPHAROPLASTY  UPPER EYELID W EXCESS SKIN;  Surgeon: Karle Starch, MD;  Location: [facility];  Service: Ophthalmology;  Laterality: Bilateral;  . PTOSIS REPAIR Bilateral 07/10/2017   Procedure: PTOSIS REPAIR RESECT EX;  Surgeon:  Karle Starch, MD;  Location: [facility];  Service: Ophthalmology;  Laterality: Bilateral;    OB History   None      Home Medications    Prior to Admission medications   Medication Sig Start Date End Date Taking? Authorizing Provider  acetaminophen (TYLENOL) 500 MG tablet Take by mouth.   Yes [provider]  chlorthalidone (HYGROTON) 25 MG tablet  06/12/16  Yes [provider]  glucosamine-chondroitin 500-400 MG tablet Take 1 tablet by mouth daily.   Yes [provider]  metoprolol tartrate (LOPRESSOR) 25 MG tablet Take 75 mg by mouth 2 (two) times daily.   Yes [provider]  rivaroxaban (XARELTO) 20 MG TABS tablet Take 20 mg by mouth daily with supper.   Yes [provider]  ondansetron (ZOFRAN-ODT) 4 MG disintegrating tablet Take 1 tablet (4 mg total) by mouth every 8 (eight) hours as needed for nausea or vomiting. 12/23/17   Coral Spikes, DO  potassium chloride SA (K-DUR,KLOR-CON) 20 MEQ tablet Take 2 tablets (40 mEq total) by mouth daily for 3 days. 12/23/17 12/26/17  Coral Spikes, DO  traMADol (ULTRAM) 50 MG tablet Take 1 tablet (50 mg total) by mouth every 6 (six) hours as needed. 07/14/17   Lisa Roca, MD    Family History Family History  Problem Relation Age of Onset  . Breast cancer Neg Hx     Social History  Social History   Tobacco Use  . Smoking status: Never Smoker  . Smokeless tobacco: Never Used  Substance Use Topics  . Alcohol use: Yes    Alcohol/week: 8.4 oz    Types: 14 Shots of liquor per week  . Drug use: No     Allergies   Pravastatin   Review of Systems Review of Systems   Physical Exam Triage Vital Signs ED Triage Vitals [12/23/17 1000]  Enc Vitals Group     BP (!) 152/73     Pulse Rate 90     Resp 16     Temp 98.7 F (37.1 C)     Temp Source Oral     SpO2 94 %     Weight 184 lb (83.5 kg)     Height      Head Circumference      Peak Flow      Pain Score 7     Pain Loc       Pain Edu?      Excl. in [place]?    No data found.  Updated Vital Signs BP (!) 152/73 (BP Location: Left Arm)   Pulse 90   Temp 98.7 F (37.1 C) (Oral)   Resp 16   Wt 184 lb (83.5 kg)   SpO2 94%   BMI 30.62 kg/m   Visual Acuity Right Eye Distance:   Left Eye Distance:   Bilateral Distance:    Right Eye Near:   Left Eye Near:    Bilateral Near:     Physical Exam  Constitutional: She is oriented to person, place, and time. She appears well-developed. No distress.  HENT:  Oropharynx clear.  Slightly dry mucous membranes.  Cardiovascular: Normal rate.  Irregular.  Pulmonary/Chest: Effort normal and breath sounds normal. She has no wheezes. She has no rales.  Abdominal: Soft.  Nondistended.  Tender to palpation in the right mid to lower abdomen.  Neurological: She is alert and oriented to person, place, and time.  Psychiatric: She has a normal mood and affect. Her behavior is normal.  Nursing note reviewed.  UC Treatments / Results  Labs (all labs ordered are listed, but only abnormal results are displayed) Labs Reviewed  CBC WITH DIFFERENTIAL/PLATELET - Abnormal; Notable for the following components:      Result Value   Hemoglobin 16.3 (*)    MCH 34.5 (*)    Neutro Abs 9.2 (*)    Lymphs Abs 0.7 (*)    All other components within normal limits  COMPREHENSIVE METABOLIC PANEL - Abnormal; Notable for the following components:   Sodium 129 (*)    Potassium 3.2 (*)    Chloride 88 (*)    Glucose, Bld 204 (*)    GFR calc non Af Amer 56 (*)    All other components within normal limits    EKG None  Radiology Ct Abdomen Pelvis W Contrast  Result Date: 12/23/2017 CLINICAL DATA:  Patient with nausea, vomiting and right-sided abdominal pain. EXAM: CT ABDOMEN AND PELVIS WITH CONTRAST TECHNIQUE: Multidetector CT imaging of the abdomen and pelvis was performed using the standard protocol following bolus administration of intravenous contrast. CONTRAST:  158mL ISOVUE-300  IOPAMIDOL (ISOVUE-300) INJECTION 61% COMPARISON:  CT abdomen pelvis 05/27/2015 FINDINGS: Lower chest: Heart is enlarged. Minimal atelectasis within the lingula and lower lobes. No pleural effusion. Hepatobiliary: Liver is normal in size and contour. No focal lesion is identified. Cholelithiasis. No gallbladder wall thickening. Pancreas: Unremarkable Spleen: Similar appearing 1.7 cm partially exophytic  lesion off the spleen, potentially representing hemangioma. Adrenals/Urinary Tract: Stable bilateral adrenal nodules, most compatible with adenomas. Kidneys enhance symmetrically with contrast. No hydronephrosis. Urinary bladder is unremarkable. Stomach/Bowel: Colon is decompressed. There is wall thickening of a few loops of small bowel within the lower abdomen and pelvis. Small amount of pelvic free fluid (image 78; series 2). Small bowel feces slightly more proximal to this area. Normal morphology of the stomach. No free intraperitoneal air. Vascular/Lymphatic: Normal caliber abdominal aorta. Peripheral calcified atherosclerotic plaque. No retroperitoneal lymphadenopathy. Reproductive: Uterus is surgically absent. Other: None. Musculoskeletal: Lumbar spine degenerative changes. No aggressive or acute appearing osseous lesions. IMPRESSION: There are a few thickened loops of small bowel within the lower abdomen and pelvis with adjacent fluid, potentially secondary to enteritis. Upstream from this area there is small bowel feces intraluminal, suggestive of slow transit of the distal small bowel. No dilated or gaseous distended loops of small bowel to suggest overt obstruction. Electronically Signed   By: Lovey Newcomer M.D.   On: 12/23/2017 13:43    Procedures Procedures (including critical care time)  Medications Ordered in UC Medications  sodium chloride 0.9 % bolus 1,000 mL (0 mLs Intravenous Stopped 12/23/17 1249)  iopamidol (ISOVUE-300) 61 % injection 100 mL (100 mLs Intravenous Contrast Given 12/23/17 1258)     Initial Impression / Assessment and Plan / UC Course  I have reviewed the triage vital signs and the nursing notes.  Pertinent labs & imaging results that were available during my care of the patient were reviewed by me and considered in my medical decision making (see chart for details).    84 year old female presents with abdominal pain, nausea, vomiting, diarrhea.  Metabolic panel with hyponatremia, hypokalemia, hypochloremia.  IV fluids given.  Sending home on K-Dur for repletion.  CT scan suggestive of enteritis.  Advised hydration.  Zofran as needed.  Supportive care.  Final Clinical Impressions(s) / UC Diagnoses   Final diagnoses:  Right lower quadrant abdominal pain  Nausea vomiting and diarrhea     Discharge Instructions     Zofran as needed for nausea.  Potassium as prescribed.  Fluids!  Take care  Dr. Lacinda Axon     ED Prescriptions    Medication Sig Dispense Auth. Provider   potassium chloride SA (K-DUR,KLOR-CON) 20 MEQ tablet Take 2 tablets (40 mEq total) by mouth daily for 3 days. 6 tablet Boubacar Lerette G, DO   ondansetron (ZOFRAN-ODT) 4 MG disintegrating tablet Take 1 tablet (4 mg total) by mouth every 8 (eight) hours as needed for nausea or vomiting. 20 tablet Coral Spikes, DO     Controlled Substance Prescriptions [place] Controlled Substance Registry consulted? Not Applicable   Coral Spikes, DO 12/23/17 1354

## 2017-12-23 NOTE — Discharge Instructions (Signed)
Zofran as needed for nausea.  Potassium as prescribed.  Fluids!  Take care  Dr. Lacinda Axon

## 2018-06-21 ENCOUNTER — Ambulatory Visit (INDEPENDENT_AMBULATORY_CARE_PROVIDER_SITE_OTHER): Payer: Medicare Other | Admitting: Vascular Surgery

## 2018-06-21 ENCOUNTER — Encounter (INDEPENDENT_AMBULATORY_CARE_PROVIDER_SITE_OTHER): Payer: Medicare Other

## 2018-06-21 ENCOUNTER — Encounter (INDEPENDENT_AMBULATORY_CARE_PROVIDER_SITE_OTHER): Payer: Self-pay | Admitting: Vascular Surgery

## 2018-06-21 ENCOUNTER — Ambulatory Visit (INDEPENDENT_AMBULATORY_CARE_PROVIDER_SITE_OTHER): Payer: Medicare Other

## 2018-06-21 VITALS — BP 147/82 | HR 72 | Resp 12 | Ht 65.0 in | Wt 181.6 lb

## 2018-06-21 DIAGNOSIS — I10 Essential (primary) hypertension: Secondary | ICD-10-CM | POA: Diagnosis not present

## 2018-06-21 DIAGNOSIS — I739 Peripheral vascular disease, unspecified: Secondary | ICD-10-CM | POA: Diagnosis not present

## 2018-06-21 DIAGNOSIS — I4891 Unspecified atrial fibrillation: Secondary | ICD-10-CM | POA: Diagnosis not present

## 2018-06-21 NOTE — Assessment & Plan Note (Signed)
Her ABIs today are 1.3 on the right and 1.01 on the left with good biphasic waveforms.  Her digital pressures are 112 bilaterally with good digital deflections.  Perfusion remains intact.  No role for intervention.  Continue current medical regimen.  Recheck in 1 year with noninvasive studies.

## 2018-06-21 NOTE — Progress Notes (Signed)
MRN : [id number]  Tiffany Shaffer is a 84 y.o. (Jun 11, 1932) female who presents with chief complaint of  Chief Complaint  Patient presents with  . Follow-up  .  History of Present Illness: Patient returns today in follow up of PAD.  She is many years status post intervention for claudication symptoms.  She currently does not have any lifestyle limiting claudication, ischemic rest pain, or ulceration.  Her ABIs today are 1.3 on the right and 1.01 on the left with good biphasic waveforms.  Her digital pressures are 112 bilaterally with good digital deflections.  Current Outpatient Medications  Medication Sig Dispense Refill  . acetaminophen (TYLENOL) 500 MG tablet Take by mouth.    . chlorthalidone (HYGROTON) 25 MG tablet     . glucosamine-chondroitin 500-400 MG tablet Take 1 tablet by mouth daily.    . metoprolol tartrate (LOPRESSOR) 25 MG tablet Take 75 mg by mouth 2 (two) times daily.    . rivaroxaban (XARELTO) 20 MG TABS tablet Take 20 mg by mouth daily with supper.     No current facility-administered medications for this visit.     Past Medical History:  Diagnosis Date  . Atrial fibrillation ([place])   . Cancer ([place]) 08/29/2016   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  . Headache    behind right eye  . History of blood clots 07/10/2013   Left lower leg  . SVT (supraventricular tachycardia) ([place])   . Wears dentures    full upper, p[artial lower    Past Surgical History:  Procedure Laterality Date  . ABDOMINAL HYSTERECTOMY    . BREAST BIOPSY Left    neg  . BREAST BIOPSY Right    neg  . BROW LIFT Bilateral 07/10/2017   Procedure: BLEPHAROPLASTY  UPPER EYELID W EXCESS SKIN;  Surgeon: Karle Starch, MD;  Location: [facility];  Service: Ophthalmology;  Laterality: Bilateral;  . PTOSIS REPAIR Bilateral 07/10/2017   Procedure: PTOSIS REPAIR RESECT EX;  Surgeon: Karle Starch, MD;  Location: [facility];  Service: Ophthalmology;  Laterality: Bilateral;   Social  History  Substance Use Topics  . Smoking status: Never Smoker  . Smokeless tobacco: Never Used  . Alcohol use Yes    Family History      Family History  Problem Relation Age of Onset  . Breast cancer Neg Hx         Allergies  Allergen Reactions  . Pravastatin Other (See Comments)     REVIEW OF SYSTEMS(Negative unless checked)  Constitutional: [] ?Weight loss[] ?Fever[] ?Chills Cardiac:[] ?Chest pain[] ?Chest pressure[x] ?Palpitations [] ?Shortness of breath when laying flat [] ?Shortness of breath at rest [] ?Shortness of breath with exertion. Vascular: [] ?Pain in legs with walking[] ?Pain in legsat rest[] ?Pain in legs when laying flat [] ?Claudication [] ?Pain in feet when walking [] ?Pain in feet at rest [] ?Pain in feet when laying flat [] ?History of DVT [] ?Phlebitis [] ?Swelling in legs [] ?Varicose veins [] ?Non-healing ulcers Pulmonary: [] ?Uses home oxygen [] ?Productive cough[] ?Hemoptysis [] ?Wheeze [] ?COPD [] ?Asthma Neurologic: [] ?Dizziness [] ?Blackouts [] ?Seizures [] ?History of stroke [] ?History of TIA[] ?Aphasia [] ?Temporary blindness[] ?Dysphagia [] ?Weaknessor numbness in arms [] ?Weakness or numbnessin legs Musculoskeletal: [x] ?Arthritis [] ?Joint swelling [] ?Joint pain [] ?Low back pain Hematologic:[] ?Easy bruising[] ?Easy bleeding [] ?Hypercoagulable state [] ?Anemic  Gastrointestinal:[] ?Blood in stool[] ?Vomiting blood[] ?Gastroesophageal reflux/heartburn[] ?Abdominal pain Genitourinary: [] ?Chronic kidney disease [] ?Difficulturination [] ?Frequenturination [] ?Burning with urination[] ?Hematuria Skin: [] ?Rashes [] ?Ulcers [] ?Wounds Psychological: [] ?History of anxiety[] ?History of major depression.    Physical Examination  BP (!) 147/82 (BP Location: Right Arm, Patient Position: Sitting)   Pulse 72   Resp 12   Ht 5\' 5"  (1.651 m)  Wt 181 lb 9.6 oz (82.4 kg)    BMI 30.22 kg/m  Gen:  WD/WN, NAD Head: [place]/AT, No temporalis wasting. Ear/Nose/Throat: Hearing grossly intact, nares w/o erythema or drainage Eyes: Conjunctiva clear. Sclera non-icteric Neck: Supple.  Trachea midline Pulmonary:  Good air movement, no use of accessory muscles.  Cardiac: Irregular Vascular:  Vessel Right Left  Radial Palpable Palpable                          PT Not Palpable 1+ Palpable  DP Palpable Palpable    Musculoskeletal: M/S 5/5 throughout.  No deformity or atrophy. No edema. Neurologic: Sensation grossly intact in extremities.  Symmetrical.  Speech is fluent.  Psychiatric: Judgment intact, Mood & affect appropriate for pt's clinical situation. Dermatologic: No rashes or ulcers noted.  No cellulitis or open wounds.       Labs No results found for this or any previous visit (from the past 2160 hour(s)).  Radiology No results found.  Assessment/Plan Essential hypertension, benign blood pressure control important in reducing the progression of atherosclerotic disease. On appropriate oral medications.   Atrial fibrillation ([place]) On Xarelto. Rate controlled  PVD (peripheral vascular disease) ([facility]) Her ABIs today are 1.3 on the right and 1.01 on the left with good biphasic waveforms.  Her digital pressures are 112 bilaterally with good digital deflections.  Perfusion remains intact.  No role for intervention.  Continue current medical regimen.  Recheck in 1 year with noninvasive studies.    Leotis Pain, MD  06/21/2018 2:24 PM    This note was created with Dragon medical transcription system.  Any errors from dictation are purely unintentional

## 2018-06-21 NOTE — Patient Instructions (Signed)
Peripheral Vascular Disease  Peripheral vascular disease (PVD) is a disease of the blood vessels that are not part of your heart and brain. A simple term for PVD is poor circulation. In most cases, PVD narrows the blood vessels that carry blood from your heart to the rest of your body. This can reduce the supply of blood to your arms, legs, and internal organs, like your stomach or kidneys. However, PVD most often affects a person's lower legs and feet. Without treatment, PVD tends to get worse. PVD can also lead to acute ischemic limb. This is when an arm or leg suddenly cannot get enough blood. This is a medical emergency. Follow these instructions at home: Lifestyle  Do not use any products that contain nicotine or tobacco, such as cigarettes and e-cigarettes. If you need help quitting, ask your doctor.  Lose weight if you are overweight. Or, stay at a healthy weight as told by your doctor.  Eat a diet that is low in fat and cholesterol. If you need help, ask your doctor.  Exercise regularly. Ask your doctor for activities that are right for you. General instructions  Take over-the-counter and prescription medicines only as told by your doctor.  Take good care of your feet: ? Wear comfortable shoes that fit well. ? Check your feet often for any cuts or sores.  Keep all follow-up visits as told by your doctor This is important. Contact a doctor if:  You have cramps in your legs when you walk.  You have leg pain when you are at rest.  You have coldness in a leg or foot.  Your skin changes.  You are unable to get or have an erection (erectile dysfunction).  You have cuts or sores on your feet that do not heal. Get help right away if:  Your arm or leg turns cold, numb, and blue.  Your arms or legs become red, warm, swollen, painful, or numb.  You have chest pain.  You have trouble breathing.  You suddenly have weakness in your face, arm, or leg.  You become very  confused or you cannot speak.  You suddenly have a very bad headache.  You suddenly cannot see. Summary  Peripheral vascular disease (PVD) is a disease of the blood vessels.  A simple term for PVD is poor circulation. Without treatment, PVD tends to get worse.  Treatment may include exercise, low fat and low cholesterol diet, and quitting smoking. This information is not intended to replace advice given to you by your health care provider. Make sure you discuss any questions you have with your health care provider. Document Released: 09/06/2009 Document Revised: 07/20/2016 Document Reviewed: 07/20/2016 Elsevier Interactive Patient Education  2019 [employer]  

## 2018-07-01 HISTORY — PX: BREAST BIOPSY: SHX20

## 2018-08-19 DIAGNOSIS — I700 Atherosclerosis of aorta: Secondary | ICD-10-CM | POA: Insufficient documentation

## 2018-09-13 ENCOUNTER — Inpatient Hospital Stay
Admission: EM | Admit: 2018-09-13 | Discharge: 2018-09-18 | DRG: 389 | Disposition: A | Discharge status: Home / Self Care | Payer: Medicare Other | Attending: Surgery | Admitting: Surgery

## 2018-09-13 ENCOUNTER — Emergency Department: Payer: Medicare Other

## 2018-09-13 ENCOUNTER — Other Ambulatory Visit: Payer: Self-pay

## 2018-09-13 DIAGNOSIS — E669 Obesity, unspecified: Secondary | ICD-10-CM | POA: Diagnosis present

## 2018-09-13 DIAGNOSIS — Z4659 Encounter for fitting and adjustment of other gastrointestinal appliance and device: Secondary | ICD-10-CM

## 2018-09-13 DIAGNOSIS — R319 Hematuria, unspecified: Secondary | ICD-10-CM | POA: Diagnosis present

## 2018-09-13 DIAGNOSIS — I482 Chronic atrial fibrillation, unspecified: Secondary | ICD-10-CM | POA: Diagnosis present

## 2018-09-13 DIAGNOSIS — Z85828 Personal history of other malignant neoplasm of skin: Secondary | ICD-10-CM

## 2018-09-13 DIAGNOSIS — Z9049 Acquired absence of other specified parts of digestive tract: Secondary | ICD-10-CM | POA: Diagnosis not present

## 2018-09-13 DIAGNOSIS — N390 Urinary tract infection, site not specified: Secondary | ICD-10-CM | POA: Diagnosis present

## 2018-09-13 DIAGNOSIS — Z7901 Long term (current) use of anticoagulants: Secondary | ICD-10-CM | POA: Diagnosis not present

## 2018-09-13 DIAGNOSIS — K5651 Intestinal adhesions [bands], with partial obstruction: Principal | ICD-10-CM | POA: Diagnosis present

## 2018-09-13 DIAGNOSIS — K8020 Calculus of gallbladder without cholecystitis without obstruction: Secondary | ICD-10-CM | POA: Diagnosis present

## 2018-09-13 DIAGNOSIS — Z79899 Other long term (current) drug therapy: Secondary | ICD-10-CM | POA: Diagnosis not present

## 2018-09-13 DIAGNOSIS — Z6829 Body mass index (BMI) 29.0-29.9, adult: Secondary | ICD-10-CM | POA: Diagnosis not present

## 2018-09-13 DIAGNOSIS — I10 Essential (primary) hypertension: Secondary | ICD-10-CM | POA: Diagnosis present

## 2018-09-13 DIAGNOSIS — I739 Peripheral vascular disease, unspecified: Secondary | ICD-10-CM | POA: Diagnosis present

## 2018-09-13 DIAGNOSIS — R1084 Generalized abdominal pain: Secondary | ICD-10-CM | POA: Diagnosis present

## 2018-09-13 DIAGNOSIS — Z888 Allergy status to other drugs, medicaments and biological substances status: Secondary | ICD-10-CM

## 2018-09-13 DIAGNOSIS — Z8249 Family history of ischemic heart disease and other diseases of the circulatory system: Secondary | ICD-10-CM

## 2018-09-13 DIAGNOSIS — Z86010 Personal history of colonic polyps: Secondary | ICD-10-CM

## 2018-09-13 DIAGNOSIS — Z86718 Personal history of other venous thrombosis and embolism: Secondary | ICD-10-CM | POA: Diagnosis not present

## 2018-09-13 DIAGNOSIS — K56600 Partial intestinal obstruction, unspecified as to cause: Secondary | ICD-10-CM

## 2018-09-13 DIAGNOSIS — K56609 Unspecified intestinal obstruction, unspecified as to partial versus complete obstruction: Secondary | ICD-10-CM | POA: Diagnosis present

## 2018-09-13 HISTORY — DX: Peripheral vascular disease, unspecified: I73.9

## 2018-09-13 LAB — URINALYSIS, ROUTINE W REFLEX MICROSCOPIC
Bilirubin Urine: NEGATIVE
Glucose, UA: NEGATIVE mg/dL
Ketones, ur: NEGATIVE mg/dL
Nitrite: NEGATIVE
PROTEIN: NEGATIVE mg/dL
SPECIFIC GRAVITY, URINE: 1.01 (ref 1.005–1.030)
pH: 6 (ref 5.0–8.0)

## 2018-09-13 LAB — LIPASE, BLOOD: LIPASE: 30 U/L (ref 11–51)

## 2018-09-13 LAB — COMPREHENSIVE METABOLIC PANEL
ALK PHOS: 68 U/L (ref 38–126)
ALT: 30 U/L (ref 0–44)
ANION GAP: 10 (ref 5–15)
AST: 34 U/L (ref 15–41)
Albumin: 4.7 g/dL (ref 3.5–5.0)
BUN: 15 mg/dL (ref 8–23)
CHLORIDE: 99 mmol/L (ref 98–111)
CO2: 28 mmol/L (ref 22–32)
Calcium: 9.5 mg/dL (ref 8.9–10.3)
Creatinine, Ser: 0.86 mg/dL (ref 0.44–1.00)
GFR calc Af Amer: >60 mL/min (ref >60)
Glucose, Bld: 120 mg/dL — ABNORMAL HIGH (ref 70–99)
POTASSIUM: 4.1 mmol/L (ref 3.5–5.1)
Sodium: 137 mmol/L (ref 135–145)
Total Bilirubin: 1 mg/dL (ref 0.3–1.2)
Total Protein: 8 g/dL (ref 6.5–8.1)

## 2018-09-13 LAB — CBC WITH DIFFERENTIAL/PLATELET
Abs Immature Granulocytes: 0.06 10*3/uL (ref 0.00–0.07)
Basophils Absolute: 0.1 10*3/uL (ref 0.0–0.1)
Basophils Relative: 1 %
EOS ABS: 0.1 10*3/uL (ref 0.0–0.5)
EOS PCT: 1 %
HEMATOCRIT: 51.5 % — AB (ref 36.0–46.0)
HEMOGLOBIN: 17.9 g/dL — AB (ref 12.0–15.0)
Immature Granulocytes: 1 %
LYMPHS PCT: 10 %
Lymphs Abs: 1.1 10*3/uL (ref 0.7–4.0)
MCH: 33.5 pg (ref 26.0–34.0)
MCHC: 34.8 g/dL (ref 30.0–36.0)
MCV: 96.3 fL (ref 80.0–100.0)
MONO ABS: 0.5 10*3/uL (ref 0.1–1.0)
Monocytes Relative: 5 %
Neutro Abs: 8.3 10*3/uL — ABNORMAL HIGH (ref 1.7–7.7)
Neutrophils Relative %: 82 %
Platelets: 151 10*3/uL (ref 150–400)
RBC: 5.35 MIL/uL — AB (ref 3.87–5.11)
RDW: 13.2 % (ref 11.5–15.5)
WBC: 10.1 10*3/uL (ref 4.0–10.5)
nRBC: 0 % (ref 0.0–0.2)

## 2018-09-13 LAB — PROTIME-INR
INR: 2.3 — AB (ref 0.8–1.2)
INR: 2 — ABNORMAL HIGH (ref 0.8–1.2)
PROTHROMBIN TIME: 22.8 s — AB (ref 11.4–15.2)
Prothrombin Time: 24.9 seconds — ABNORMAL HIGH (ref 11.4–15.2)

## 2018-09-13 LAB — TROPONIN I: Troponin I: <0.03 ng/mL (ref <0.03)

## 2018-09-13 LAB — APTT
APTT: 44 s — AB (ref 24–36)
aPTT: 103 seconds — ABNORMAL HIGH (ref 24–36)

## 2018-09-13 LAB — LACTIC ACID, PLASMA
LACTIC ACID, VENOUS: 1.4 mmol/L (ref 0.5–1.9)
LACTIC ACID, VENOUS: 1.3 mmol/L (ref 0.5–1.9)

## 2018-09-13 LAB — HEPARIN LEVEL (UNFRACTIONATED): Heparin Unfractionated: >3.6 IU/mL — ABNORMAL HIGH (ref 0.30–0.70)

## 2018-09-13 MED ORDER — IOPAMIDOL (ISOVUE-300) INJECTION 61%: 100.0000 mL | Freq: Once | INTRAVENOUS | Status: DC | PRN

## 2018-09-13 MED ORDER — MORPHINE SULFATE (PF) 4 MG/ML IV SOLN
INTRAVENOUS | Status: AC
  Administered 2018-09-13: 4 mg via INTRAVENOUS
  Filled 2018-09-13: qty 1

## 2018-09-13 MED ORDER — ONDANSETRON HCL 4 MG/2ML IJ SOLN
4.0000 mg | Freq: Once | INTRAMUSCULAR | Status: AC
  Administered 2018-09-13: 4 mg via INTRAVENOUS

## 2018-09-13 MED ORDER — MORPHINE SULFATE (PF) 4 MG/ML IV SOLN
4.0000 mg | Freq: Once | INTRAVENOUS | Status: AC
  Administered 2018-09-13: 4 mg via INTRAVENOUS

## 2018-09-13 MED ORDER — ONDANSETRON 4 MG PO TBDP: 4.0000 mg | ORAL_TABLET | Freq: Four times a day (QID) | ORAL | Status: DC | PRN

## 2018-09-13 MED ORDER — SODIUM CHLORIDE 0.9 % IV BOLUS
500.0000 mL | Freq: Once | INTRAVENOUS | Status: AC
  Administered 2018-09-13: 500 mL via INTRAVENOUS

## 2018-09-13 MED ORDER — SODIUM CHLORIDE 0.9 % IV SOLN
1.0000 g | INTRAVENOUS | Status: AC
  Administered 2018-09-13: 1 g via INTRAVENOUS
  Filled 2018-09-13: qty 10

## 2018-09-13 MED ORDER — LACTATED RINGERS IV SOLN
INTRAVENOUS | Status: DC
  Administered 2018-09-13 – 2018-09-17 (×8): via INTRAVENOUS

## 2018-09-13 MED ORDER — PANTOPRAZOLE SODIUM 40 MG IV SOLR
40.0000 mg | Freq: Every day | INTRAVENOUS | Status: DC
  Administered 2018-09-13 – 2018-09-16 (×4): 40 mg via INTRAVENOUS
  Filled 2018-09-13 (×4): qty 40

## 2018-09-13 MED ORDER — ONDANSETRON HCL 4 MG/2ML IJ SOLN: 4.0000 mg | Freq: Four times a day (QID) | INTRAMUSCULAR | Status: DC | PRN

## 2018-09-13 MED ORDER — KETOROLAC TROMETHAMINE 15 MG/ML IJ SOLN
15.0000 mg | Freq: Four times a day (QID) | INTRAMUSCULAR | Status: DC
  Administered 2018-09-13 – 2018-09-14 (×4): 15 mg via INTRAVENOUS
  Filled 2018-09-13 (×5): qty 1

## 2018-09-13 MED ORDER — METOPROLOL TARTRATE 5 MG/5ML IV SOLN
5.0000 mg | INTRAVENOUS | Status: DC | PRN
  Administered 2018-09-14 – 2018-09-15 (×6): 5 mg via INTRAVENOUS
  Filled 2018-09-13 (×6): qty 5

## 2018-09-13 MED ORDER — ONDANSETRON HCL 4 MG/2ML IJ SOLN
INTRAMUSCULAR | Status: AC
  Administered 2018-09-13: 4 mg via INTRAVENOUS
  Filled 2018-09-13: qty 2

## 2018-09-13 MED ORDER — METOPROLOL TARTRATE 5 MG/5ML IV SOLN: 5.0000 mg | Freq: Four times a day (QID) | INTRAVENOUS | Status: DC | PRN

## 2018-09-13 MED ORDER — IOHEXOL 300 MG/ML  SOLN
100.0000 mL | Freq: Once | INTRAMUSCULAR | Status: AC | PRN
  Administered 2018-09-13: 100 mL via INTRAVENOUS

## 2018-09-13 MED ORDER — HEPARIN (PORCINE) 25000 UT/250ML-% IV SOLN
1000.0000 [IU]/h | INTRAVENOUS | Status: DC
  Administered 2018-09-13: 1050 [IU]/h via INTRAVENOUS
  Administered 2018-09-15 – 2018-09-16 (×2): 900 [IU]/h via INTRAVENOUS
  Filled 2018-09-13 (×3): qty 250

## 2018-09-13 MED ORDER — MORPHINE SULFATE (PF) 2 MG/ML IV SOLN: 2.0000 mg | INTRAVENOUS | Status: DC | PRN

## 2018-09-13 NOTE — ED Provider Notes (Signed)
[facility] Emergency Department Provider Note  ____________________________________________   First MD Initiated Contact with Patient 09/13/18 [phone]     (approximate)  I have reviewed the triage vital signs and the nursing notes.   HISTORY  Chief Complaint Abdominal Pain    HPI Tiffany Shaffer is a 85 y.o. female's medical history includes atrial fibrillation on Xarelto who presents by EMS for evaluation of abdominal pain.  She says she has abdominal pain sometimes but she had acute onset of abdominal pain last night at around 6:00 PM and it has persisted.  This is different than the abdominal pain she has had in the past although similar to some she had a couple of years ago where they did not find the reason.  She reports that it comes and goes every few minutes and that it is severe at times.  Nothing in particular makes it better or worse.  She denies fever/chills, sore throat, nasal congestion, cough, shortness of breath, nausea, vomiting, and diarrhea.  She has had multiple abdominal surgeries in the past including a bowel resection.  She has no sensation of distention.  She has not been traveling recently and has had no contact with anyone who has tested positive nor been at high risk of COVID-19.   No recent dysuria.        Past Medical History:  Diagnosis Date  . Atrial fibrillation ([place])   . Cancer ([place]) 08/29/2016   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  . Headache    behind right eye  . History of blood clots 07/10/2013   Left lower leg  . SVT (supraventricular tachycardia) ([place])   . Wears dentures    full upper, p[artial lower    Patient Active Problem List   Diagnosis Date Noted  . Essential hypertension, benign 06/23/2016  . Atrial fibrillation ([place]) 06/23/2016  . PVD (peripheral vascular disease) ([place]) 06/23/2016    Past Surgical History:  Procedure Laterality Date  . ABDOMINAL HYSTERECTOMY    . BREAST BIOPSY Left    neg  .  BREAST BIOPSY Right    neg  . BROW LIFT Bilateral 07/10/2017   Procedure: BLEPHAROPLASTY  UPPER EYELID W EXCESS SKIN;  Surgeon: Karle Starch, MD;  Location: [place];  Service: Ophthalmology;  Laterality: Bilateral;  . PTOSIS REPAIR Bilateral 07/10/2017   Procedure: PTOSIS REPAIR RESECT EX;  Surgeon: Karle Starch, MD;  Location: [place];  Service: Ophthalmology;  Laterality: Bilateral;    Prior to Admission medications   Medication Sig Start Date End Date Taking? Authorizing Provider  acetaminophen (TYLENOL) 500 MG tablet Take by mouth.    [provider]  chlorthalidone (HYGROTON) 25 MG tablet  06/12/16   [provider]  glucosamine-chondroitin 500-400 MG tablet Take 1 tablet by mouth daily.    [provider]  metoprolol tartrate (LOPRESSOR) 25 MG tablet Take 75 mg by mouth 2 (two) times daily.    [provider]  rivaroxaban (XARELTO) 20 MG TABS tablet Take 20 mg by mouth daily with supper.    [provider]    Allergies Pravastatin  Family History  Problem Relation Age of Onset  . Breast cancer Neg Hx     Social History Social History   Tobacco Use  . Smoking status: Never Smoker  . Smokeless tobacco: Never Used  Substance Use Topics  . Alcohol use: Yes    Alcohol/week: 14.0 standard drinks    Types: 14 Shots of liquor  per week  . Drug use: No    Review of Systems Constitutional: No fever/chills Eyes: No visual changes. ENT: No sore throat. Cardiovascular: Denies chest pain. Respiratory: Denies shortness of breath. Gastrointestinal: Abdominal pain as described above Genitourinary: Negative for dysuria. Musculoskeletal: Negative for neck pain.  Negative for back pain. Integumentary: Negative for rash. Neurological: Negative for headaches, focal weakness or numbness.   ____________________________________________   PHYSICAL EXAM:  VITAL SIGNS: ED Triage Vitals  Enc Vitals Group     BP  09/13/18 0415 (!) 202/98     Pulse Rate 09/13/18 0415 97     Resp 09/13/18 0430 15     Temp 09/13/18 0415 98.7 F (37.1 C)     Temp Source 09/13/18 0415 Oral     SpO2 09/13/18 0415 95 %     Weight 09/13/18 0418 81.6 kg (180 lb)     Height 09/13/18 0418 1.651 m (5\' 5" )     Head Circumference --      Peak Flow --      Pain Score 09/13/18 0418 8     Pain Loc --      Pain Edu? --      Excl. in [place]? --     Constitutional: Alert and oriented. Well appearing and in no acute distress. Eyes: Conjunctivae are normal.  Head: Atraumatic. Nose: No congestion/rhinnorhea. Mouth/Throat: Mucous membranes are moist. Neck: No stridor.  No meningeal signs.   Cardiovascular: Normal rate, regular rhythm. Good peripheral circulation. Grossly normal heart sounds. Respiratory: Normal respiratory effort.  No retractions. Lungs CTAB. Gastrointestinal: Soft and nondistended.  Tender to palpation all throughout the lower abdomen but most notable periumbilically but without guarding or rebound. Musculoskeletal: No lower extremity tenderness nor edema. No gross deformities of extremities. Neurologic:  Normal speech and language. No gross focal neurologic deficits are appreciated.  Skin:  Skin is warm, dry and intact. No rash noted. Psychiatric: Mood and affect are normal. Speech and behavior are normal.  ____________________________________________   LABS (all labs ordered are listed, but only abnormal results are displayed)  Labs Reviewed  CBC WITH DIFFERENTIAL/PLATELET - Abnormal; Notable for the following components:      Result Value   RBC 5.35 (*)    Hemoglobin 17.9 (*)    HCT 51.5 (*)    Neutro Abs 8.3 (*)    All other components within normal limits  COMPREHENSIVE METABOLIC PANEL - Abnormal; Notable for the following components:   Glucose, Bld 120 (*)    All other components within normal limits  PROTIME-INR - Abnormal; Notable for the following components:   Prothrombin Time 24.9 (*)    INR  2.3 (*)    All other components within normal limits  URINE CULTURE  TROPONIN I  LACTIC ACID, PLASMA  LACTIC ACID, PLASMA  LIPASE, BLOOD  URINALYSIS, ROUTINE W REFLEX MICROSCOPIC   ____________________________________________  EKG  ED ECG REPORT I, Hinda Kehr, the attending physician, personally viewed and interpreted this ECG.  Date: 09/13/2018 EKG Time: 4:17 AM Rate: 94 Rhythm: Atrial fibrillation QRS Axis: normal Intervals: Right bundle branch block ST/T Wave abnormalities: Non-specific ST segment / T-wave changes, but no clear evidence of acute ischemia. Narrative Interpretation: no definitive evidence of acute ischemia; does not meet STEMI criteria.   ____________________________________________  RADIOLOGY   ED MD interpretation: Early bowel obstruction  Official radiology report(s): Ct Abdomen Pelvis W Contrast  Result Date: 09/13/2018 CLINICAL DATA:  Abdominal pain with diverticulitis suspected EXAM: CT ABDOMEN AND PELVIS WITH CONTRAST  TECHNIQUE: Multidetector CT imaging of the abdomen and pelvis was performed using the standard protocol following bolus administration of intravenous contrast. CONTRAST:  166mL OMNIPAQUE IOHEXOL 300 MG/ML  SOLN COMPARISON:  12/23/2017 FINDINGS: Lower chest: Cardiomegaly, especially the atria. Atherosclerosis. No acute finding Hepatobiliary: No focal liver abnormality.Cholelithiasis. No evidence of biliary obstruction or inflammation Pancreas: Unremarkable. Spleen: Unremarkable. Adrenals/Urinary Tract: Stable bilateral adrenal nodules consistent with adenomas given stability. This is largest on the right at 2.7 cm. No hydronephrosis or stone. Unremarkable bladder. Stomach/Bowel: Mild small bowel dilatation just after a segment containing fecalized material there is transition to decompressed ileum. The colon is relatively empty of stool and gas. Mild mesenteric haziness around the maximally dilated loops. The collapsed loops are borderline  thickened. No pericecal inflammation. Duodenal diverticulum. Vascular/Lymphatic: No acute vascular abnormality. Atherosclerosis. No mass or adenopathy. Reproductive:Hysterectomy. Other: Trace reactive ascites. Musculoskeletal: Lumbar spine degeneration with L3-4 anterolisthesis. IMPRESSION: 1. Partial/early small bowel obstruction at the level of the ileum. 2. Cholelithiasis without cholecystitis. Electronically Signed   By: Monte Fantasia M.D.   On: 09/13/2018 06:47    ____________________________________________   PROCEDURES   Procedure(s) performed (including Critical Care):  Procedures   ____________________________________________   INITIAL IMPRESSION / MDM / [place] / ED COURSE  As part of my medical decision making, I reviewed the following data within the [place] History obtained from family, Nursing notes reviewed and incorporated, Labs reviewed , EKG interpreted , Old chart reviewed, Discussed with admitting physician (Dr. Rosana Hoes), A consult was requested and obtained from this/these consultant(s) Surgery and Notes from prior ED visits        Differential diagnosis includes, but is not limited to, diverticulitis, SBO or partial SBO, ileus, appendicitis, UTI/pyelonephritis, mesenteric ischemia.  The patient is having no urinary symptoms.  Her vital signs are reassuring except for persistent hypertension but that could be related to not taking her medication as well as to pain.  She has had no respiratory symptoms recently and is at low risk for COVID-19 exposure.  Given how high her blood pressure is it is unlikely she is having a low flow mesenteric ischemia.  ACS is very unlikely.  She has required bowel resection in the past which apparently is due to diverticulitis.  Be my main concern for tonight and once her blood work has returned I will evaluate with a CT scan of the abdomen and pelvis with IV contrast.  She is nauseated and received 4 mg of  Zofran IV.  She should be adequately anticoagulated on Xarelto and her EKG shows that she still is in chronic A. fib.  She is in no apparent distress at this time and wanted nausea medicine but no pain medication currently.  Clinical Course as of Sep 12 737  Fri Sep 13, 2018  0548 I just checked with the lab and they had not yet started running the blood because the blood was sent down before orders are ready so they were storing it.  They will start running the blood work now.   [CF]  [phone] Lactic acid is normal, patient is anticoagulated, comprehensive metabolic panel is within normal limits including kidney function, troponin is negative, CBC is notable for hemoconcentration with a hemoglobin of 17.9 and I will order 500 mL normal saline IV bolus.  Lipase is normal.   [CF]  4315 The faxed results of the urinalysis indicate a small amount of hemoglobin and moderate leukocyte esterase with a few WBCs.  I will  plan to treat empirically and send a urine culture.   [CF]  [phone] The patient is having a mild headache as well as persistent abdominal pain.  She is now getting morphine 4 mg IV.  CT scan is done and we are waiting for the results.   [CF]  0730 CT indicates probable early bowel obstruction.  I informed the patient and her daughter.  I have ordered a 500 mL normal saline IV bolus and asked her to remain n.p.o.  I ordered the NG tube.  I called and spoke by phone with Dr. Rosana Hoes with surgery who reviewed the CT scan and will consult and see the patient in the emergency department along with his 78 assistant and admit her for further management.   [CF]    Clinical Course User Index [CF] Hinda Kehr, MD    ____________________________________________  FINAL CLINICAL IMPRESSION(S) / ED DIAGNOSES  Final diagnoses:  Generalized abdominal pain  Urinary tract infection with hematuria, site unspecified  Partial small bowel obstruction (HCC)  Current use of long term anticoagulation   Chronic atrial fibrillation     MEDICATIONS GIVEN DURING THIS VISIT:  Medications  iopamidol (ISOVUE-300) 61 % injection 100 mL (has no administration in time range)  cefTRIAXone (ROCEPHIN) 1 g in sodium chloride 0.9 % 100 mL IVPB (1 g Intravenous New Bag/Given 09/13/18 0723)  sodium chloride 0.9 % bolus 500 mL (has no administration in time range)  ondansetron (ZOFRAN) injection 4 mg (4 mg Intravenous Given 09/13/18 0553)  iohexol (OMNIPAQUE) 300 MG/ML solution 100 mL (100 mLs Intravenous Contrast Given 09/13/18 0629)  morphine 4 MG/ML injection 4 mg (4 mg Intravenous Given 09/13/18 0272)     ED Discharge Orders    None       Note:  This document was prepared using Dragon voice recognition software and may include unintentional dictation errors.   Hinda Kehr, MD 09/13/18 [phone]

## 2018-09-13 NOTE — H&P (Signed)
[facility] SURGICAL HISTORY & PHYSICAL (cpt [phone])  HISTORY OF PRESENT ILLNESS (HPI):  85 y.o. female presented to [facility] ED today for abdominal pain. Patient reports the acute onset of sharp lower abdominal pain around 6 PM last night. She thought this was gas pain and tried to have a BM but was unsuccessful. The pain continued to worsen throughout the night which prompted her visit to the ED. She does endorse associated nausea and emesis with the pain. No complaints of fevers, chills, CP, SOB, or urinary changes. No history of similar pain episodes. She does have an abdominal surgical history positive for abdominal hysterectomy and apparent sigmoid colectomy in 2001 for polyps. Of note, she does have a history of atrial fibrillation on Xarelto. Work up in the ED was concerning for partial small bowel obstruction.   General surgery is consulted by emergency medicine physician Dr Hinda Kehr, MD for evaluation and management of a small bowel obstruction.    PAST MEDICAL HISTORY (PMH):  Past Medical History:  Diagnosis Date  . Atrial fibrillation ([place])   . Cancer ([place]) 08/29/2016   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  . Headache    behind right eye  . History of blood clots 07/10/2013   Left lower leg  . SVT (supraventricular tachycardia) ([place])   . Wears dentures    full upper, p[artial lower    Reviewed. Otherwise negative.   PAST SURGICAL HISTORY ([place]):  Past Surgical History:  Procedure Laterality Date  . ABDOMINAL HYSTERECTOMY    . BREAST BIOPSY Left    neg  . BREAST BIOPSY Right    neg  . BROW LIFT Bilateral 07/10/2017   Procedure: BLEPHAROPLASTY  UPPER EYELID W EXCESS SKIN;  Surgeon: Karle Starch, MD;  Location: [place];  Service: Ophthalmology;  Laterality: Bilateral;  . PTOSIS REPAIR Bilateral 07/10/2017   Procedure: PTOSIS REPAIR RESECT EX;  Surgeon: Karle Starch, MD;  Location: [place];  Service: Ophthalmology;  Laterality:  Bilateral;    Reviewed. Otherwise negative.   MEDICATIONS:  Prior to Admission medications   Medication Sig Start Date End Date Taking? Authorizing Provider  acetaminophen (TYLENOL) 500 MG tablet Take 500-1,000 mg by mouth every 6 (six) hours as needed for mild pain or fever.    Yes [provider]  chlorthalidone (HYGROTON) 50 MG tablet Take 25 mg by mouth daily as needed (fluid retention).    Yes [provider]  metoprolol tartrate (LOPRESSOR) 25 MG tablet Take 75 mg by mouth 2 (two) times daily.   Yes [provider]  rivaroxaban (XARELTO) 20 MG TABS tablet Take 20 mg by mouth daily with supper.   Yes [provider]  glucosamine-chondroitin 500-400 MG tablet Take 1 tablet by mouth daily.    [provider]     ALLERGIES:  Allergies  Allergen Reactions  . Pravastatin Other (See Comments)    Muscle aches     SOCIAL HISTORY:  Social History   Socioeconomic History  . Marital status: Widowed    Spouse name: Not on file  . Number of children: Not on file  . Years of education: Not on file  . Highest education level: Not on file  Occupational History  . Not on file  Social Needs  . Financial resource strain: Not on file  . Food insecurity:    Worry: Not on file    Inability: Not on file  . Transportation needs:    Medical: Not on file  Non-medical: Not on file  Tobacco Use  . Smoking status: Never Smoker  . Smokeless tobacco: Never Used  Substance and Sexual Activity  . Alcohol use: Yes    Alcohol/week: 14.0 standard drinks    Types: 14 Shots of liquor per week  . Drug use: No  . Sexual activity: Not on file  Lifestyle  . Physical activity:    Days per week: Not on file    Minutes per session: Not on file  . Stress: Not on file  Relationships  . Social connections:    Talks on phone: Not on file    Gets together: Not on file    Attends religious service: Not on file    Active member of club or organization: Not  on file    Attends meetings of clubs or organizations: Not on file    Relationship status: Not on file  . Intimate partner violence:    Fear of current or ex partner: Not on file    Emotionally abused: Not on file    Physically abused: Not on file    Forced sexual activity: Not on file  Other Topics Concern  . Not on file  Social History Narrative  . Not on file     FAMILY HISTORY:  Family History  Problem Relation Age of Onset  . Breast cancer Neg Hx     Otherwise negative.   REVIEW OF SYSTEMS:  Review of Systems  Constitutional: Negative for chills and fever.  Respiratory: Negative for cough and shortness of breath.   Cardiovascular: Negative for chest pain and palpitations.  Gastrointestinal: Positive for abdominal pain, nausea and vomiting. Negative for blood in stool, constipation and diarrhea.  Genitourinary: Negative for dysuria and hematuria.  Neurological: Negative for dizziness and headaches.  All other systems reviewed and are negative.   VITAL SIGNS:  Temp:  [98.7 F (37.1 C)] 98.7 F (37.1 C) (03/20 0415) Pulse Rate:  [72-131] 125 (03/20 0700) Resp:  [15-25] 20 (03/20 0700) BP: (167-202)/(93-108) 171/93 (03/20 0700) SpO2:  [91 %-95 %] 91 % (03/20 0700) Weight:  [81.6 kg] 81.6 kg (03/20 0418)     Height: 5\' 5"  (165.1 cm) Weight: 81.6 kg BMI (Calculated): 29.95   PHYSICAL EXAM:  Physical Exam Vitals signs and nursing note reviewed.  Constitutional:      General: She is not in acute distress.    Appearance: She is well-developed. She is obese. She is not ill-appearing.  HENT:     Head: Normocephalic and atraumatic.  Eyes:     General: No scleral icterus.    Extraocular Movements: Extraocular movements intact.  Cardiovascular:     Rate and Rhythm: Tachycardia present. Rhythm irregular.     Heart sounds: Normal heart sounds. No murmur. No friction rub. No gallop.      Comments: Irregularly irregular Pulmonary:     Effort: Pulmonary effort is normal.  No respiratory distress.     Breath sounds: Normal breath sounds. No wheezing or rhonchi.  Abdominal:     General: Abdomen is protuberant. A surgical scar is present. There is no distension.     Palpations: Abdomen is soft.     Tenderness: There is abdominal tenderness (Mild, improved from presentation) in the suprapubic area and left lower quadrant. There is no guarding or rebound.     Comments: Lower midline laparotomy scar  Genitourinary:    Comments: Deferred Skin:    General: Skin is warm and dry.     Coloration: Skin is  not jaundiced.  Neurological:     General: No focal deficit present.     Mental Status: She is alert and oriented to person, place, and time.  Psychiatric:        Mood and Affect: Mood normal.        Behavior: Behavior normal.     INTAKE/OUTPUT:  This shift: No intake/output data recorded.  Last 2 shifts: @IOLAST2SHIFTS @  Labs:  CBC Latest Ref Rng & Units 09/13/2018 12/23/2017 12/18/2015  WBC 4.0 - 10.5 K/uL 10.1 10.1 9.0  Hemoglobin 12.0 - 15.0 g/dL 17.9(H) 16.3(H) 15.5  Hematocrit 36.0 - 46.0 % 51.5(H) 46.5 44.0  Platelets 150 - 400 K/uL 151 164 134(L)   CMP Latest Ref Rng & Units 09/13/2018 12/23/2017 12/18/2015  Glucose 70 - 99 mg/dL 120(H) 204(H) 136(H)  BUN 8 - 23 mg/dL 15 15 11   Creatinine 0.44 - 1.00 mg/dL 0.86 0.91 0.80  Sodium 135 - 145 mmol/L 137 129(L) 134(L)  Potassium 3.5 - 5.1 mmol/L 4.1 3.2(L) 3.9  Chloride 98 - 111 mmol/L 99 88(L) 100(L)  CO2 22 - 32 mmol/L 28 28 26   Calcium 8.9 - 10.3 mg/dL 9.5 9.1 9.1  Total Protein 6.5 - 8.1 g/dL 8.0 7.9 6.8  Total Bilirubin 0.3 - 1.2 mg/dL 1.0 1.2 1.3(H)  Alkaline Phos 38 - 126 U/L 68 64 54  AST 15 - 41 U/L 34 24 22  ALT 0 - 44 U/L 30 21 18      Imaging studies:   CT Abdomen/Pelvis (09/13/2018) personally reviewed and radiologist:  IMPRESSION: 1. Partial/early small bowel obstruction at the level of the ileum. 2. Cholelithiasis without cholecystitis.  Assessment/Plan: (ICD-10's: K90.51) 85  y.o. female with partial small bowel obstruction, likely attributable to post-surgical adhesions following abdominal hysterectomy, complicated by pertinent comorbidities including atrial fibrillation on xarelto, multiple abdominal surgeries, obesity, and advanced age.  - NPO for now, IV fluids             - insert NG tube for nasogastric decompression             - monitor ongoing bowel function and abdominal exam              - anticipate symptomatic relief within 24 - 48 hours following NGT insertion, followed by "rumbling" the following day and flatus either the same day or the day following the "rumbling" with anticipated length of stay ~3 - 5 days with successful non-operative management for 8 of 10 patients with small bowel obstruction attributed to post-surgical adhesions  - surgical intervention if doesn't improve was also discussed             - medical management comorbidities as per medical team; appreciate their consult             - ambulation encouraged              - DVT prophylaxis  All of the above findings and recommendations were discussed with the patient and her family, and all of her and her family's questions were answered to their expressed satisfaction.  -- Edison Simon, PA-C [facility] 09/13/2018, 8:10 AM [phone] M-F: 7am - 4pm

## 2018-09-13 NOTE — Progress Notes (Signed)
ANTICOAGULATION CONSULT NOTE - Follow Up Consult  Pharmacy Consult for Heparin Drip Indication: atrial fibrillation  Allergies  Allergen Reactions  . Pravastatin Other (See Comments)    Muscle aches    Patient Measurements: Height: 5\' 5"  (165.1 cm) Weight: 180 lb (81.6 kg) IBW/kg (Calculated) : 57 Heparin Dosing Weight: 74.4 kg  Vital Signs: Temp: 98.6 F (37 C) (03/20 1506) Temp Source: Oral (03/20 1506) BP: 145/91 (03/20 1506) Pulse Rate: 101 (03/20 1506)  Labs: Recent Labs    09/13/18 0415 09/13/18 0947  HGB 17.9*  --   HCT 51.5*  --   PLT 151  --   APTT  --  44*  LABPROT 24.9* 22.8*  INR 2.3* 2.0*  HEPARINUNFRC  --  >3.60*  CREATININE 0.86  --   TROPONINI <0.03  --     Estimated Creatinine Clearance: 51.4 mL/min (by C-G formula based on SCr of 0.86 mg/dL).   Medications:  Scheduled:  . ketorolac  15 mg Intravenous Q6H  . pantoprazole (PROTONIX) IV  40 mg Intravenous QHS    Assessment: Patient has a PMH significant for Afib. Pharmacy was consulted for anticoagulant management while in the inpatient setting. Patient was taking a DOAC at home.  3/20 @ 0947 aPTT 44(baseline) 3/20 @ 1753 aPTT 103 supratherapeutic  3/20 @ 0947 HL > 3.60(baseline)  3/20 @ 0947 INR 2.0(baseline)  Goal of Therapy:  Heparin level 0.3-0.7 units/ml aPTT 66-102 seconds Monitor platelets by anticoagulation protocol: Yes   Plan:  APTT is currently slightly supratherapeutic.  Will decrease heparin infusion to 950 units/hr Will order repeat level on 3/21 @ 0100 and daily while on heparin Continue to monitor H&H and platelets  Taquilla Downum A Conley Delisle 09/13/2018,4:26 PM

## 2018-09-13 NOTE — ED Notes (Signed)
ED TO INPATIENT HANDOFF REPORT  ED Nurse Name and Phone #: Obdulia Steier 35  S Name/Age/Gender Tiffany Shaffer 85 y.o. female Room/Bed: ED10A/ED10A  Code Status   Code Status: Full Code  Home/SNF/Other Home Patient oriented to: self, place, time and situation Is this baseline? Yes   Triage Complete: Triage complete  Chief Complaint Abdominal Pain  Triage Note Patient arrived via [facility] from home. Reporting chronic abdominal pain that began 17 years ago. Has occasionally bothered her over the last few weeks. Last night around 6PM the abdominal pain returned and she did not get any relief and did not try any interventions. Pain is currently 8.5/10. According to EMS patient is in AFIB and takes home Xarelto for anticoagulation and Metoprolol for rate control.   Allergies Allergies  Allergen Reactions  . Pravastatin Other (See Comments)    Muscle aches    Level of Care/Admitting Diagnosis ED Disposition    ED Disposition Condition [facility] Area: [place] [[id number]]  Level of Care: Med-Surg [16]  Diagnosis: SBO (small bowel obstruction) [facility]) [[id number]]  Admitting Physician: Vickie Epley [[id number]]  Attending Physician: Vickie Epley [[id number]]  Estimated length of stay: past midnight tomorrow  Certification:: I certify this patient will need inpatient services for at least 2 midnights  PT Class (Do Not Modify): Inpatient [101]  PT Acc Code (Do Not Modify): Private [1]       B Medical/Surgery History Past Medical History:  Diagnosis Date  . Atrial fibrillation ([place])   . Cancer ([place]) 08/29/2016   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  . Headache    behind right eye  . History of blood clots 07/10/2013   Left lower leg  . SVT (supraventricular tachycardia) ([place])   . Wears dentures    full upper, p[artial lower   Past Surgical History:  Procedure Laterality Date  . ABDOMINAL HYSTERECTOMY    . BREAST BIOPSY  Left    neg  . BREAST BIOPSY Right    neg  . BROW LIFT Bilateral 07/10/2017   Procedure: BLEPHAROPLASTY  UPPER EYELID W EXCESS SKIN;  Surgeon: Karle Starch, MD;  Location: [place];  Service: Ophthalmology;  Laterality: Bilateral;  . PTOSIS REPAIR Bilateral 07/10/2017   Procedure: PTOSIS REPAIR RESECT EX;  Surgeon: Karle Starch, MD;  Location: [place];  Service: Ophthalmology;  Laterality: Bilateral;     A IV Location/Drains/Wounds Patient Lines/Drains/Airways Status   Active Line/Drains/Airways    Name:   Placement date:   Placement time:   Site:   Days:   Peripheral IV 09/13/18 Left Antecubital   09/13/18    0438    Antecubital   less than 1   NG/OG Tube Nasogastric 14 Fr. Right nare Xray;Aucultation;Confirmed by Surgical Manipulation   09/13/18    0739    Right nare   less than 1          Intake/Output Last 24 hours No intake or output data in the 24 hours ending 09/13/18 0819  Labs/Imaging Results for orders placed or performed during the hospital encounter of 09/13/18 (from the past 48 hour(s))  CBC with Differential/Platelet     Status: Abnormal   Collection Time: 09/13/18  4:15 AM  Result Value Ref Range   WBC 10.1 4.0 - 10.5 K/uL   RBC 5.35 (H) 3.87 - 5.11 MIL/uL   Hemoglobin 17.9 (H) 12.0 - 15.0 g/dL   HCT 51.5 (H) 36.0 - 46.0 %  MCV 96.3 80.0 - 100.0 fL   MCH 33.5 26.0 - 34.0 pg   MCHC 34.8 30.0 - 36.0 g/dL   RDW 13.2 11.5 - 15.5 %   Platelets 151 150 - 400 K/uL   nRBC 0.0 0.0 - 0.2 %   Neutrophils Relative % 82 %   Neutro Abs 8.3 (H) 1.7 - 7.7 K/uL   Lymphocytes Relative 10 %   Lymphs Abs 1.1 0.7 - 4.0 K/uL   Monocytes Relative 5 %   Monocytes Absolute 0.5 0.1 - 1.0 K/uL   Eosinophils Relative 1 %   Eosinophils Absolute 0.1 0.0 - 0.5 K/uL   Basophils Relative 1 %   Basophils Absolute 0.1 0.0 - 0.1 K/uL   Immature Granulocytes 1 %   Abs Immature Granulocytes 0.06 0.00 - 0.07 K/uL    Comment: Performed at [facility], [address] [place], [place] [address]  Troponin I - ONCE - STAT     Status: None   Collection Time: 09/13/18  4:15 AM  Result Value Ref Range   Troponin I <0.03 <0.03 ng/mL    Comment: Performed at [facility], [address] [place], [place] [address]  Comprehensive metabolic panel     Status: Abnormal   Collection Time: 09/13/18  4:15 AM  Result Value Ref Range   Sodium 137 135 - 145 mmol/L   Potassium 4.1 3.5 - 5.1 mmol/L   Chloride 99 98 - 111 mmol/L   CO2 28 22 - 32 mmol/L   Glucose, Bld 120 (H) 70 - 99 mg/dL   BUN 15 8 - 23 mg/dL   Creatinine, Ser 0.86 0.44 - 1.00 mg/dL   Calcium 9.5 8.9 - 10.3 mg/dL   Total Protein 8.0 6.5 - 8.1 g/dL   Albumin 4.7 3.5 - 5.0 g/dL   AST 34 15 - 41 U/L   ALT 30 0 - 44 U/L   Alkaline Phosphatase 68 38 - 126 U/L   Total Bilirubin 1.0 0.3 - 1.2 mg/dL   GFR calc non Af Amer >60 >60 mL/min   GFR calc Af Amer >60 >60 mL/min   Anion gap 10 5 - 15    Comment: Performed at [facility], [address] [place], [place] [address]  Lactic acid, plasma     Status: None   Collection Time: 09/13/18  4:15 AM  Result Value Ref Range   Lactic Acid, Venous 1.4 0.5 - 1.9 mmol/L    Comment: Performed at [facility], [address] [place], [place] [address]  Lipase, blood     Status: None   Collection Time: 09/13/18  4:15 AM  Result Value Ref Range   Lipase 30 11 - 51 U/L    Comment: Performed at [facility], [address] [place], [place] [address]  Protime-INR     Status: Abnormal   Collection Time: 09/13/18  4:15 AM  Result Value Ref Range   Prothrombin Time 24.9 (H) 11.4 - 15.2 seconds   INR 2.3 (H) 0.8 - 1.2    Comment: (NOTE) INR goal varies based on device and disease states. Performed at [facility], [address] [place], [place] [address]   Lactic acid, plasma     Status: None   Collection Time: 09/13/18  4:50 AM  Result Value Ref Range   Lactic Acid, Venous 1.3 0.5 - 1.9 mmol/L     Comment: Performed at [facility], [address], [place], [place] [address]  Ct Abdomen Pelvis W Contrast  Result Date: 09/13/2018 CLINICAL DATA:  Abdominal pain with diverticulitis suspected EXAM: CT ABDOMEN AND PELVIS WITH CONTRAST TECHNIQUE: Multidetector CT imaging of the abdomen and pelvis was performed using the standard protocol following bolus administration of intravenous contrast. CONTRAST:  120mL OMNIPAQUE IOHEXOL 300 MG/ML  SOLN COMPARISON:  12/23/2017 FINDINGS: Lower chest: Cardiomegaly, especially the atria. Atherosclerosis. No acute finding Hepatobiliary: No focal liver abnormality.Cholelithiasis. No evidence of biliary obstruction or inflammation Pancreas: Unremarkable. Spleen: Unremarkable. Adrenals/Urinary Tract: Stable bilateral adrenal nodules consistent with adenomas given stability. This is largest on the right at 2.7 cm. No hydronephrosis or stone. Unremarkable bladder. Stomach/Bowel: Mild small bowel dilatation just after a segment containing fecalized material there is transition to decompressed ileum. The colon is relatively empty of stool and gas. Mild mesenteric haziness around the maximally dilated loops. The collapsed loops are borderline thickened. No pericecal inflammation. Duodenal diverticulum. Vascular/Lymphatic: No acute vascular abnormality. Atherosclerosis. No mass or adenopathy. Reproductive:Hysterectomy. Other: Trace reactive ascites. Musculoskeletal: Lumbar spine degeneration with L3-4 anterolisthesis. IMPRESSION: 1. Partial/early small bowel obstruction at the level of the ileum. 2. Cholelithiasis without cholecystitis. Electronically Signed   By: Monte Fantasia M.D.   On: 09/13/2018 06:47   Dg Abd Portable 1 View  Result Date: 09/13/2018 CLINICAL DATA:  Nasogastric tube placement EXAM: PORTABLE ABDOMEN - 1 VIEW COMPARISON:  CT abdomen and pelvis September 13, 2018 FINDINGS: Nasogastric tube tip and side port are in the stomach. Visualized portions of  the bowel gas appear grossly normal. No free air. Lung bases are clear. IMPRESSION: Nasogastric tube tip and side port in stomach. Visualized bowel gas pattern nonspecific. Lung bases clear. Electronically Signed   By: Lowella Grip III M.D.   On: 09/13/2018 08:00    Pending Labs Unresulted Labs (From admission, onward)    Start     Ordered   09/14/18 3149  Basic metabolic panel  Tomorrow morning,   STAT     09/13/18 0810   09/14/18 0500  CBC  Tomorrow morning,   STAT     09/13/18 0810   09/13/18 0616  Urine Culture  Add-on,   AD    Question:  Patient immune status  Answer:  Normal   09/13/18 0615   09/13/18 0447  Urinalysis, Routine w reflex microscopic  ONCE - STAT,   STAT     09/13/18 0446          Vitals/Pain Today's Vitals   09/13/18 0650 09/13/18 0700 09/13/18 0717 09/13/18 0800  BP:  (!) 171/93  (!) 164/93  Pulse:  (!) 125  (!) 124  Resp:  20  16  Temp:      TempSrc:      SpO2:  91%  96%  Weight:      Height:      PainSc: 8   0-No pain     Isolation Precautions No active isolations  Medications Medications  iopamidol (ISOVUE-300) 61 % injection 100 mL (has no administration in time range)  lactated ringers infusion (has no administration in time range)  ketorolac (TORADOL) 15 MG/ML injection 15 mg (has no administration in time range)  morphine 2 MG/ML injection 2 mg (has no administration in time range)  ondansetron (ZOFRAN-ODT) disintegrating tablet 4 mg (has no administration in time range)    Or  ondansetron (ZOFRAN) injection 4 mg (has no administration in time range)  pantoprazole (PROTONIX) injection 40 mg (has no administration in time range)  metoprolol tartrate (LOPRESSOR) injection 5 mg (  has no administration in time range)  ondansetron (ZOFRAN) injection 4 mg (4 mg Intravenous Given 09/13/18 0553)  iohexol (OMNIPAQUE) 300 MG/ML solution 100 mL (100 mLs Intravenous Contrast Given 09/13/18 0629)  morphine 4 MG/ML injection 4 mg (4 mg Intravenous  Given 09/13/18 0652)  cefTRIAXone (ROCEPHIN) 1 g in sodium chloride 0.9 % 100 mL IVPB (0 g Intravenous Stopped 09/13/18 0755)  sodium chloride 0.9 % bolus 500 mL (500 mLs Intravenous New Bag/Given 09/13/18 0742)    Mobility walks Low fall risk   Focused Assessments here for abdominal pain with vomiting. abdomen soft on exam. pt feels distended/bloated compared to baseline   R Recommendations: See Admitting Provider Note  Report given to:   Additional Notes:  NGT is to [facility]IS.  Afib, with history of same.

## 2018-09-13 NOTE — H&P (Addendum)
ANTICOAGULATION CONSULT NOTE - Initial Consult  Pharmacy Consult for Heparin Initiation  Indication: atrial fibrillation    Allergies  Allergen Reactions  . Pravastatin Other (See Comments)    Muscle aches    Patient Measurements: Height: 5\' 5"  (165.1 cm) Weight: 180 lb (81.6 kg) IBW/kg (Calculated) : 57 Heparin Dosing Weight: 74.4 kg  Calculation 74.4 kg x 50 units/kg/hr   Vital Signs: Temp: 98 F (36.7 C) (03/20 0855) Temp Source: Oral (03/20 0855) BP: 174/140 (03/20 0855) Pulse Rate: 124 (03/20 0855)  Labs: Recent Labs    09/13/18 0415  HGB 17.9*  HCT 51.5*  PLT 151  LABPROT 24.9*  INR 2.3*  CREATININE 0.86  TROPONINI <0.03   aPTT 44  HL >3.60  INR (at 0947) 2.0    Estimated Creatinine Clearance: 51.4 mL/min (by C-G formula based on SCr of 0.86 mg/dL).   Medical History: Past Medical History:  Diagnosis Date  . Atrial fibrillation ([place])   . Cancer ([place]) 08/29/2016   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  . Headache    behind right eye  . History of blood clots 07/10/2013   Left lower leg  . SVT (supraventricular tachycardia) ([place])   . Wears dentures    full upper, p[artial lower    Medications:  Xarelto 20 mg QD (last dose 09/12/2018 @ 2000)   Assessment: Patient has a PMH significant for Afib. Pharmacy was consulted for anticoagulant management while in the inpatient setting. Patient recently received last dose of DOAC < 24 hrs ago and therefore a bolus dose is not warranted at this time.   Goal of Therapy:  Heparin level 0.3-0.7 units/ml aPTT 60-102s seconds Monitor platelets by anticoagulation protocol: Yes   Plan:  Obtain baseline INR, HL, and aPTT. Start heparin infusion at 1050 units/hr for 24 hours. Will recheck heparin daily (with AM labs) and CBC daily. Check aPTT level in 8 hours. Continue to monitor H&H and platelets heparin  Rowland Lathe, PharmD 09/13/2018,9:30 AM

## 2018-09-13 NOTE — ED Triage Notes (Addendum)
Patient arrived via [facility] from home. Reporting chronic abdominal pain that began 17 years ago. Has occasionally bothered her over the last few weeks. Last night around 6PM the abdominal pain returned and she did not get any relief and did not try any interventions. Pain is currently 8.5/10. According to EMS patient is in AFIB and takes home Xarelto for anticoagulation and Metoprolol for rate control.

## 2018-09-13 NOTE — ED Notes (Signed)
Surgery consult at bedside.

## 2018-09-13 NOTE — Progress Notes (Signed)
Patient ID: ISMAEL TREPTOW, female   DOB: [birth year], 85 y.o.   MRN: [id number]  ACP note  Patient and daughter present  Diagnosis: Atrial fibrillation with rapid ventricular response, accelerated hypertension, partial small bowel obstruction, peripheral vascular disease, DVT  CODE STATUS discussed and patient is a full code.  Patient coming in with abdominal pain nausea and vomiting.  She was found to have a partial small bowel obstruction on CT scan of the abdomen.  Because the patient has an NG tube in I am limited with oral medications.  I am giving IV metoprolol and IV heparin drip.  May need to add clonidine patch to control blood pressure if blood pressure still remains elevated.  The patient had a difficult time deciding on CODE STATUS.  Time spent on ACP discussion 17 minutes Dr. Loletha Grayer

## 2018-09-13 NOTE — Consult Note (Signed)
[place] [facility] [place] NAME: Tiffany Shaffer    MR#:  [id number]  DATE OF BIRTH:  [birth year]  DATE OF ADMISSION:  09/13/2018  PRIMARY CARE PHYSICIAN: Sallee Lange, NP   REQUESTING/REFERRING PHYSICIAN: Dr Tama High  CHIEF COMPLAINT:   Chief Complaint  Patient presents with  . Abdominal Pain    HISTORY OF PRESENT ILLNESS:  Tiffany Shaffer  is a 85 y.o. female with a known history of hypertension and atrial fibrillation and DVT.  She comes in with abdominal pain that is been bothersome for a while now.  It has been on and off usually lasting 5 to 10 minutes.  Last night around 6 PM developed abdominal pain that did not go away.  She could not go to sleep.  At 3 AM she could not take it anymore and she called 911.  Pain is in the mid abdomen was 9 out of 10 intensity when she came in.  Associated with nausea.  She vomited in the emergency room.  Last Sunday she did have diarrhea but last night she had a firm bowel movement.  After pain medications her abdominal pain went away.  She currently has an NG tube in.  PAST MEDICAL HISTORY:   Past Medical History:  Diagnosis Date  . Atrial fibrillation ([place])   . Cancer ([place]) 08/29/2016   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  . Headache    behind right eye  . History of blood clots 07/10/2013   Left lower leg  . PVD (peripheral vascular disease) ([place])   . SVT (supraventricular tachycardia) ([place])   . Wears dentures    full upper, p[artial lower    PAST SURGICAL HISTOIRY:   Past Surgical History:  Procedure Laterality Date  . ABDOMINAL HYSTERECTOMY    . angiogram legs    . BREAST BIOPSY Left    neg  . BREAST BIOPSY Right    neg  . BROW LIFT Bilateral 07/10/2017   Procedure: BLEPHAROPLASTY  UPPER EYELID W EXCESS SKIN;  Surgeon: Karle Starch, MD;  Location: [place];  Service: Ophthalmology;  Laterality: Bilateral;  . cataracts    . COLON RESECTION    . HERNIA REPAIR    .  PTOSIS REPAIR Bilateral 07/10/2017   Procedure: PTOSIS REPAIR RESECT EX;  Surgeon: Karle Starch, MD;  Location: [place];  Service: Ophthalmology;  Laterality: Bilateral;    SOCIAL HISTORY:   Social History   Tobacco Use  . Smoking status: Never Smoker  . Smokeless tobacco: Never Used  Substance Use Topics  . Alcohol use: Yes    Alcohol/week: 14.0 standard drinks    Types: 14 Shots of liquor per week    FAMILY HISTORY:   Family History  Problem Relation Age of Onset  . CVA Mother   . CAD Father   . Breast cancer Neg Hx     DRUG ALLERGIES:   Allergies  Allergen Reactions  . Pravastatin Other (See Comments)    Muscle aches    REVIEW OF SYSTEMS:  CONSTITUTIONAL: No fever, some sweats.  Positive for fatigue.  EYES: Wears glasses and has blurred vision EARS, NOSE, AND THROAT: No tinnitus or ear pain.  RESPIRATORY: Some cough, some shortness of breath.  No wheezing or hemoptysis.  CARDIOVASCULAR: Occasional chest pain.  Some edema.  GASTROINTESTINAL: Positive nausea, vomiting, and abdominal pain.  No blood in the bowel movements.  Normal bowel movement last night GENITOURINARY: No dysuria, hematuria.  ENDOCRINE: No polyuria, nocturia,  HEMATOLOGY: No anemia, easy bruising or bleeding SKIN: No rash or lesion. MUSCULOSKELETAL: Some arthritis pain in the shoulders NEUROLOGIC: No tingling, numbness, weakness.  PSYCHIATRY: No anxiety or depression.   MEDICATIONS AT HOME:   Prior to Admission medications   Medication Sig Start Date End Date Taking? Authorizing Provider  acetaminophen (TYLENOL) 500 MG tablet Take 500-1,000 mg by mouth every 6 (six) hours as needed for mild pain or fever.    Yes [provider]  chlorthalidone (HYGROTON) 50 MG tablet Take 25 mg by mouth daily as needed (fluid retention).    Yes [provider]  fluocinonide (LIDEX) 0.05 % external solution Apply 1 application topically 2 (two) times daily as needed (itching).     Yes [provider]  glucosamine-chondroitin 500-400 MG tablet Take 1 tablet by mouth daily.   Yes [provider]  metoprolol tartrate (LOPRESSOR) 25 MG tablet Take 75 mg by mouth 2 (two) times daily.   Yes [provider]  rivaroxaban (XARELTO) 20 MG TABS tablet Take 20 mg by mouth daily with supper.   Yes [provider]      VITAL SIGNS:  Blood pressure (!) 174/140, pulse (!) 124, temperature 98 F (36.7 C), temperature source Oral, resp. rate 18, height 5\' 5"  (1.651 m), weight 81.6 kg, SpO2 94 %.  PHYSICAL EXAMINATION:  GENERAL:  85 y.o.-year-old patient lying in the bed with no acute distress.  EYES: Pupils equal, round, reactive to light and accommodation. No scleral icterus. Extraocular muscles intact.  HEENT: Head atraumatic, normocephalic. Oropharynx and nasopharynx clear.  NECK:  Supple, no jugular venous distention. No thyroid enlargement, no tenderness.  LUNGS: Normal breath sounds bilaterally, no wheezing, rales,rhonchi or crepitation. No use of accessory muscles of respiration.  CARDIOVASCULAR: S1, S2 normal. No murmurs, rubs, or gallops.  ABDOMEN: Soft, generalized abdominal tenderness.  Decreased bowel sounds. No organomegaly or mass.  EXTREMITIES: 2+ pedal edema.no cyanosis, or clubbing.  NEUROLOGIC: Cranial nerves II through XII are intact. Muscle strength 5/5 in all extremities. Sensation intact. Gait not checked.  PSYCHIATRIC: The patient is alert and oriented x 3.  SKIN: No obvious rash, lesion, or ulcer.   LABORATORY PANEL:   CBC Recent Labs  Lab 09/13/18 0415  WBC 10.1  HGB 17.9*  HCT 51.5*  PLT 151   ------------------------------------------------------------------------------------------------------------------  Chemistries  Recent Labs  Lab 09/13/18 0415  NA 137  K 4.1  CL 99  CO2 28  GLUCOSE 120*  BUN 15  CREATININE 0.86  CALCIUM 9.5  AST 34  ALT 30  ALKPHOS 68  BILITOT 1.0    ------------------------------------------------------------------------------------------------------------------  Cardiac Enzymes Recent Labs  Lab 09/13/18 0415  TROPONINI <0.03   ------------------------------------------------------------------------------------------------------------------  RADIOLOGY:  Ct Abdomen Pelvis W Contrast  Result Date: 09/13/2018 CLINICAL DATA:  Abdominal pain with diverticulitis suspected EXAM: CT ABDOMEN AND PELVIS WITH CONTRAST TECHNIQUE: Multidetector CT imaging of the abdomen and pelvis was performed using the standard protocol following bolus administration of intravenous contrast. CONTRAST:  125mL OMNIPAQUE IOHEXOL 300 MG/ML  SOLN COMPARISON:  12/23/2017 FINDINGS: Lower chest: Cardiomegaly, especially the atria. Atherosclerosis. No acute finding Hepatobiliary: No focal liver abnormality.Cholelithiasis. No evidence of biliary obstruction or inflammation Pancreas: Unremarkable. Spleen: Unremarkable. Adrenals/Urinary Tract: Stable bilateral adrenal nodules consistent with adenomas given stability. This is largest on the right at 2.7 cm. No hydronephrosis or stone. Unremarkable bladder. Stomach/Bowel: Mild small bowel dilatation just after a segment containing fecalized material there is transition to decompressed ileum. The colon  is relatively empty of stool and gas. Mild mesenteric haziness around the maximally dilated loops. The collapsed loops are borderline thickened. No pericecal inflammation. Duodenal diverticulum. Vascular/Lymphatic: No acute vascular abnormality. Atherosclerosis. No mass or adenopathy. Reproductive:Hysterectomy. Other: Trace reactive ascites. Musculoskeletal: Lumbar spine degeneration with L3-4 anterolisthesis. IMPRESSION: 1. Partial/early small bowel obstruction at the level of the ileum. 2. Cholelithiasis without cholecystitis. Electronically Signed   By: Monte Fantasia M.D.   On: 09/13/2018 06:47   Dg Abd Portable 1 View  Result  Date: 09/13/2018 CLINICAL DATA:  Nasogastric tube placement EXAM: PORTABLE ABDOMEN - 1 VIEW COMPARISON:  CT abdomen and pelvis September 13, 2018 FINDINGS: Nasogastric tube tip and side port are in the stomach. Visualized portions of the bowel gas appear grossly normal. No free air. Lung bases are clear. IMPRESSION: Nasogastric tube tip and side port in stomach. Visualized bowel gas pattern nonspecific. Lung bases clear. Electronically Signed   By: Lowella Grip III M.D.   On: 09/13/2018 08:00    EKG:   Atrial fibrillation 94 bpm, right bundle branch block  IMPRESSION AND PLAN:   1.  Atrial fibrillation with rapid ventricular response on last vital sign.  Change metoprolol to 5 mg IV every hour as needed heart rate greater than 140.  Monitor on telemetry for now.  Holding Xarelto and on heparin drip. 2.  Accelerated hypertension when she had pain vomiting and NG tube placement.  Asked nurse to check vital signs more often and another vital sign after that previous 1.  Continue metoprolol for hypertension with parameters written.  If blood pressure still remains elevated I will order a clonidine patch. 3.  Partial small bowel obstruction.  NG tube placement.  Surgery following.  Pain control and nausea control. 4.  History of DVT on heparin drip 5.  Peripheral vascular disease.  Patient on heparin drip  All the records are reviewed and case discussed with Consulting provider. Management plans discussed with the patient, family and they are in agreement.  CODE STATUS: Full code  TOTAL TIME TAKING CARE OF THIS PATIENT: 50 minutes. Including acp time.    Loletha Grayer M.D on 09/13/2018 at 10:52 AM  Between 7am to 6pm - Pager - [phone]  After 6pm go to [email or web address] - password [email or web address] [facility]  Office  [phone]  CC: Primary care Physician: Sallee Lange, NP

## 2018-09-13 NOTE — Plan of Care (Signed)
  Problem: Urinary Elimination: Goal: Signs and symptoms of infection will decrease Outcome: Progressing   

## 2018-09-13 NOTE — ED Notes (Signed)
Pt is alert and oriented. NAD. Unlabored. Denies pain at this time. Waiting on recommendations from surgery consult.

## 2018-09-14 ENCOUNTER — Inpatient Hospital Stay: Payer: Medicare Other

## 2018-09-14 LAB — CBC
HCT: 39.8 % (ref 36.0–46.0)
Hemoglobin: 13.6 g/dL (ref 12.0–15.0)
MCH: 33.1 pg (ref 26.0–34.0)
MCHC: 34.2 g/dL (ref 30.0–36.0)
MCV: 96.8 fL (ref 80.0–100.0)
Platelets: 120 10*3/uL — ABNORMAL LOW (ref 150–400)
RBC: 4.11 MIL/uL (ref 3.87–5.11)
RDW: 13.2 % (ref 11.5–15.5)
WBC: 11.4 10*3/uL — ABNORMAL HIGH (ref 4.0–10.5)
nRBC: 0 % (ref 0.0–0.2)

## 2018-09-14 LAB — BASIC METABOLIC PANEL
Anion gap: 9 (ref 5–15)
BUN: 21 mg/dL (ref 8–23)
CHLORIDE: 101 mmol/L (ref 98–111)
CO2: 28 mmol/L (ref 22–32)
Calcium: 8 mg/dL — ABNORMAL LOW (ref 8.9–10.3)
Creatinine, Ser: 0.93 mg/dL (ref 0.44–1.00)
GFR calc Af Amer: >60 mL/min (ref >60)
GFR calc non Af Amer: 56 mL/min — ABNORMAL LOW (ref >60)
Glucose, Bld: 119 mg/dL — ABNORMAL HIGH (ref 70–99)
Potassium: 3.7 mmol/L (ref 3.5–5.1)
Sodium: 138 mmol/L (ref 135–145)

## 2018-09-14 LAB — APTT
aPTT: 101 seconds — ABNORMAL HIGH (ref 24–36)
aPTT: 80 seconds — ABNORMAL HIGH (ref 24–36)

## 2018-09-14 LAB — HEPARIN LEVEL (UNFRACTIONATED)
HEPARIN UNFRACTIONATED: 3.08 [IU]/mL — AB (ref 0.30–0.70)
Heparin Unfractionated: 3.46 IU/mL — ABNORMAL HIGH (ref 0.30–0.70)

## 2018-09-14 MED ORDER — PHENOL 1.4 % MT LIQD
1.0000 | OROMUCOSAL | Status: DC | PRN
  Filled 2018-09-14: qty 177

## 2018-09-14 NOTE — Progress Notes (Addendum)
SURGICAL PROGRESS NOTE (cpt [phone])  Hospital Day(s): 1.   Post op day(s):  Tiffany Shaffer   Interval History: Patient seen and examined, no acute events or new complaints overnight, though patient continues to report diffuse mild abdominal pain, worst over epigastrium, and mild nausea and abdominal pain last night despite NG tube. She otherwise reports a single episode of flatus, denies bleeding (on heparin infusion), fever/chills, CP, or SOB.  Review of Systems:  Constitutional: denies fever, chills  HEENT: denies cough or congestion  Respiratory: denies any shortness of breath  Cardiovascular: denies chest pain or palpitations  Gastrointestinal: abdominal pain, N/V, and bowel function as per interval history Genitourinary: denies burning with urination or urinary frequency Musculoskeletal: denies pain, decreased motor or sensation Integumentary: denies any other rashes or skin discolorations Neurological: denies HA or vision/hearing changes   Vital signs in last 24 hours: [min-max] current  Temp:  [98.2 F (36.8 C)-99.5 F (37.5 C)] 98.2 F (36.8 C) (03/21 0435) Pulse Rate:  [90-101] 91 (03/21 0435) Resp:  [20] 20 (03/21 0435) BP: (109-164)/(68-91) 109/68 (03/21 0435) SpO2:  [93 %-95 %] 95 % (03/21 0435)     Height: 5\' 5"  (165.1 cm) Weight: 81.6 kg BMI (Calculated): 29.95   Intake/Output this shift:  Total I/O In: 181.8 [I.V.:181.8] Out: -    Intake/Output last 2 shifts:  @IOLAST2SHIFTS @   Physical Exam:  Constitutional: alert, cooperative and no distress  HENT: normocephalic without obvious abnormality  Eyes: PERRL, EOM's grossly intact and symmetric  Respiratory: breathing non-labored at rest  Cardiovascular: regular rate and sinus rhythm  Gastrointestinal: soft and distended with epigastric > diffuse mild abdominal tenderness to palpation, fluid sitting in NG tubing (72F NG tube itself placed in ED) without any visible drainage to cannister, difficult to suction from NG tube  despite able to flush Musculoskeletal: UE and LE FROM, no edema or wounds, motor and sensation grossly intact, NT   Labs:  CBC Latest Ref Rng & Units 09/14/2018 09/13/2018 12/23/2017  WBC 4.0 - 10.5 K/uL 11.4(H) 10.1 10.1  Hemoglobin 12.0 - 15.0 g/dL 13.6 17.9(H) 16.3(H)  Hematocrit 36.0 - 46.0 % 39.8 51.5(H) 46.5  Platelets 150 - 400 K/uL 120(L) 151 164   CMP Latest Ref Rng & Units 09/14/2018 09/13/2018 12/23/2017  Glucose 70 - 99 mg/dL 119(H) 120(H) 204(H)  BUN 8 - 23 mg/dL 21 15 15   Creatinine 0.44 - 1.00 mg/dL 0.93 0.86 0.91  Sodium 135 - 145 mmol/L 138 137 129(L)  Potassium 3.5 - 5.1 mmol/L 3.7 4.1 3.2(L)  Chloride 98 - 111 mmol/L 101 99 88(L)  CO2 22 - 32 mmol/L 28 28 28   Calcium 8.9 - 10.3 mg/dL 8.0(L) 9.5 9.1  Total Protein 6.5 - 8.1 g/dL - 8.0 7.9  Total Bilirubin 0.3 - 1.2 mg/dL - 1.0 1.2  Alkaline Phos 38 - 126 U/L - 68 64  AST 15 - 41 U/L - 34 24  ALT 0 - 44 U/L - 30 21    Assessment/Plan: (ICD-10's: K28.52) 85 y.o. female with mildly increased leukocytosis and inadequately improved small bowel obstruction, likely attributable to post-surgical adhesions following prior partial colectomy for a polyp not amenable to endoscopic resection and abdominal hysterectomy, complicated by pertinent comorbidities including advanced chronological age, chronic therapeutic anticoagulation for atrial fibrillation with history of lower extremity arterial embolization, HTN, and DVT.  - NPO for now, IV fluids             - remove dysfunctional 72F NG tube, replace with 58F NG tube             -  continue to monitor ongoing abdominal exam and bowel function             - anticipate symptomatic relief within 24 - 48 hours following NGT insertion, followed by "rumbling" the following day and flatus either the same day or the day following the "rumbling" with anticipated length of stay ~3 - 5 days with successful non-operative management for 8 of 10 patients with small bowel obstruction  attributed to post-surgical adhesions             - medical management comorbidities, including heparin infusion, as per medical team  - surgical intervention if doesn't improve was also discussed  - will recheck WBC tomorrow morning             - ambulation encouraged  All of the above findings and recommendations were discussed with the patient and the medical team, and all of patient's questions were answered to her expressed satisfaction.  -- Marilynne Drivers Rosana Hoes, MD, [place]: [facility] - Partnering for exceptional care. Office: [phone]

## 2018-09-14 NOTE — Progress Notes (Addendum)
ANTICOAGULATION CONSULT NOTE - Follow Up Consult  Pharmacy Consult for Heparin Drip Indication: atrial fibrillation  Allergies  Allergen Reactions  . Pravastatin Other (See Comments)    Muscle aches    Patient Measurements: Height: 5\' 5"  (165.1 cm) Weight: 180 lb (81.6 kg) IBW/kg (Calculated) : 57 Heparin Dosing Weight: 74.4 kg  Vital Signs: Temp: 99.5 F (37.5 C) (03/20 2035) Temp Source: Oral (03/20 2035) BP: 148/81 (03/20 2035) Pulse Rate: 90 (03/20 2035)  Labs: Recent Labs    09/13/18 0415 09/13/18 0947 09/13/18 1753 09/14/18 0044  HGB 17.9*  --   --  13.6  HCT 51.5*  --   --  39.8  PLT 151  --   --  120*  APTT  --  44* 103* 101*  LABPROT 24.9* 22.8*  --   --   INR 2.3* 2.0*  --   --   HEPARINUNFRC  --  >3.60*  --   --   CREATININE 0.86  --   --  0.93  TROPONINI <0.03  --   --   --     Estimated Creatinine Clearance: 47.5 mL/min (by C-G formula based on SCr of 0.93 mg/dL).   Medications:  Scheduled:  . ketorolac  15 mg Intravenous Q6H  . pantoprazole (PROTONIX) IV  40 mg Intravenous QHS    Assessment: Patient has a PMH significant for Afib. Pharmacy was consulted for anticoagulant management while in the inpatient setting. Patient was taking a DOAC at home.  3/20 @ 0947 aPTT 44(baseline) 3/20 @ 1753 aPTT 103 supratherapeutic  3/20 @ 0947 HL > 3.60(baseline)  3/20 @ 0947 INR 2.0(baseline)  Goal of Therapy:  Heparin level 0.3-0.7 units/ml aPTT 66-102 seconds Monitor platelets by anticoagulation protocol: Yes   Plan:  3/21 @ 0100 APTT 101. Level is at the high end of therapeutic. Will decrease heparin infusion to 900 units/hr.   Recheck aPTT and HL  in 8 hours. CBC ordered daily with AM labs per protocol.   Continue to monitor H&H and platelets  Pernell Dupre, PharmD, BCPS Clinical Pharmacist 09/14/2018 1:44 AM

## 2018-09-14 NOTE — Progress Notes (Signed)
[facility] at [place] NAME: Tiffany Shaffer    MR#:  [id number]  DATE OF BIRTH:  04-Feb-1932  SUBJECTIVE:  CHIEF COMPLAINT: Patient is feeling much better.  Positive flatus.  No bowel movement yet.  Denies any chest pain or palpitations  REVIEW OF SYSTEMS:  CONSTITUTIONAL: No fever, fatigue or weakness.  EYES: No blurred or double vision.  EARS, NOSE, AND THROAT: No tinnitus or ear pain.  RESPIRATORY: No cough, shortness of breath, wheezing or hemoptysis.  CARDIOVASCULAR: No chest pain, orthopnea, edema.  GASTROINTESTINAL: No nausea, vomiting, diarrhea or abdominal pain. Positive flatus GENITOURINARY: No dysuria, hematuria.  ENDOCRINE: No polyuria, nocturia,  HEMATOLOGY: No anemia, easy bruising or bleeding SKIN: No rash or lesion. MUSCULOSKELETAL: No joint pain or arthritis.   NEUROLOGIC: No tingling, numbness, weakness.  PSYCHIATRY: No anxiety or depression.   DRUG ALLERGIES:   Allergies  Allergen Reactions  . Pravastatin Other (See Comments)    Muscle aches    VITALS:  Blood pressure 109/68, pulse 91, temperature 98.2 F (36.8 C), temperature source Oral, resp. rate 20, height 5\' 5"  (1.651 m), weight 81.6 kg, SpO2 95 %.  PHYSICAL EXAMINATION:  GENERAL:  85 y.o.-year-old patient lying in the bed with no acute distress.  EYES: Pupils equal, round, reactive to light and accommodation. No scleral icterus. Extraocular muscles intact.  HEENT: Head atraumatic, normocephalic. Oropharynx and nasopharynx clear.  NECK:  Supple, no jugular venous distention. No thyroid enlargement, no tenderness.  LUNGS: Normal breath sounds bilaterally, no wheezing, rales,rhonchi or crepitation. No use of accessory muscles of respiration.  CARDIOVASCULAR: Irregularly irregular no murmurs, rubs, or gallops.  ABDOMEN: Soft, nontender, nondistended.  Hypoactive bowel sounds present.  EXTREMITIES: No pedal edema, cyanosis, or clubbing.  NEUROLOGIC: Awake,  alert and oriented x3 sensation intact. Gait not checked.  PSYCHIATRIC: The patient is alert and oriented x 3.  SKIN: No obvious rash, lesion, or ulcer.    LABORATORY PANEL:   CBC Recent Labs  Lab 09/14/18 0044  WBC 11.4*  HGB 13.6  HCT 39.8  PLT 120*   ------------------------------------------------------------------------------------------------------------------  Chemistries  Recent Labs  Lab 09/13/18 0415 09/14/18 0044  NA 137 138  K 4.1 3.7  CL 99 101  CO2 28 28  GLUCOSE 120* 119*  BUN 15 21  CREATININE 0.86 0.93  CALCIUM 9.5 8.0*  AST 34  --   ALT 30  --   ALKPHOS 68  --   BILITOT 1.0  --    ------------------------------------------------------------------------------------------------------------------  Cardiac Enzymes Recent Labs  Lab 09/13/18 0415  TROPONINI <0.03   ------------------------------------------------------------------------------------------------------------------  RADIOLOGY:  Ct Abdomen Pelvis W Contrast  Result Date: 09/13/2018 CLINICAL DATA:  Abdominal pain with diverticulitis suspected EXAM: CT ABDOMEN AND PELVIS WITH CONTRAST TECHNIQUE: Multidetector CT imaging of the abdomen and pelvis was performed using the standard protocol following bolus administration of intravenous contrast. CONTRAST:  112mL OMNIPAQUE IOHEXOL 300 MG/ML  SOLN COMPARISON:  12/23/2017 FINDINGS: Lower chest: Cardiomegaly, especially the atria. Atherosclerosis. No acute finding Hepatobiliary: No focal liver abnormality.Cholelithiasis. No evidence of biliary obstruction or inflammation Pancreas: Unremarkable. Spleen: Unremarkable. Adrenals/Urinary Tract: Stable bilateral adrenal nodules consistent with adenomas given stability. This is largest on the right at 2.7 cm. No hydronephrosis or stone. Unremarkable bladder. Stomach/Bowel: Mild small bowel dilatation just after a segment containing fecalized material there is transition to decompressed ileum. The colon is  relatively empty of stool and gas. Mild mesenteric haziness around the maximally dilated loops. The collapsed loops are  borderline thickened. No pericecal inflammation. Duodenal diverticulum. Vascular/Lymphatic: No acute vascular abnormality. Atherosclerosis. No mass or adenopathy. Reproductive:Hysterectomy. Other: Trace reactive ascites. Musculoskeletal: Lumbar spine degeneration with L3-4 anterolisthesis. IMPRESSION: 1. Partial/early small bowel obstruction at the level of the ileum. 2. Cholelithiasis without cholecystitis. Electronically Signed   By: Monte Fantasia M.D.   On: 09/13/2018 06:47   Dg Abd Portable 1 View  Result Date: 09/13/2018 CLINICAL DATA:  Nasogastric tube placement EXAM: PORTABLE ABDOMEN - 1 VIEW COMPARISON:  CT abdomen and pelvis September 13, 2018 FINDINGS: Nasogastric tube tip and side port are in the stomach. Visualized portions of the bowel gas appear grossly normal. No free air. Lung bases are clear. IMPRESSION: Nasogastric tube tip and side port in stomach. Visualized bowel gas pattern nonspecific. Lung bases clear. Electronically Signed   By: Lowella Grip III M.D.   On: 09/13/2018 08:00    EKG:   Orders placed or performed during the hospital encounter of 09/13/18  . ED EKG  . ED EKG  . EKG 12-Lead  . EKG 12-Lead    ASSESSMENT AND PLAN:    1.  Atrial fibrillation with rapid ventricular response  Clinically doing fine asymptomatic and heart rate is well controlled.   metoprolol  5 mg IV every hour as needed heart rate greater than 140.   Monitor on telemetry for now.   Holding Xarelto and on heparin drip.  2.  Accelerated hypertension when she had pain vomiting and NG tube placement.  Significantly improved Metoprolol as needed  3.  Partial small bowel obstruction.  NG tube intact  Surgery following.  Pain control and nausea control.  4.  History of DVT on heparin drip  5.  Peripheral vascular disease.  Patient on heparin drip    All the records are  reviewed and case discussed with Care Management/Social Workerr. Management plans discussed with the patient, she is in agreement. Discussed with Dr. Rosana Hoes  CODE STATUS: Fc  TOTAL TIME TAKING CARE OF THIS PATIENT: 36  minutes.     Note: This dictation was prepared with Dragon dictation along with smaller phrase technology. Any transcriptional errors that result from this process are unintentional.   Nicholes Mango M.D on 09/14/2018 at 11:12 AM  Between 7am to 6pm - Pager - [phone] After 6pm go to [email or web address] - password [email or web address] [facility]  Office  [phone]  CC: Primary care physician; Sallee Lange, NP

## 2018-09-14 NOTE — Progress Notes (Signed)
ANTICOAGULATION CONSULT NOTE - Follow Up Consult  Pharmacy Consult for Heparin Drip Indication: atrial fibrillation  Allergies  Allergen Reactions  . Pravastatin Other (See Comments)    Muscle aches    Patient Measurements: Height: 5\' 5"  (165.1 cm) Weight: 180 lb (81.6 kg) IBW/kg (Calculated) : 57 Heparin Dosing Weight: 74.4 kg  Vital Signs: Temp: 98.2 F (36.8 C) (03/21 0435) Temp Source: Oral (03/21 0435) BP: 109/68 (03/21 0435) Pulse Rate: 91 (03/21 0435)  Labs: Recent Labs    09/13/18 0415  09/13/18 0947 09/13/18 1753 09/14/18 0044 09/14/18 1015  HGB 17.9*  --   --   --  13.6  --   HCT 51.5*  --   --   --  39.8  --   PLT 151  --   --   --  120*  --   APTT  --    < > 44* 103* 101* 80*  LABPROT 24.9*  --  22.8*  --   --   --   INR 2.3*  --  2.0*  --   --   --   HEPARINUNFRC  --   --  >3.60*  --  3.46*  --   CREATININE 0.86  --   --   --  0.93  --   TROPONINI <0.03  --   --   --   --   --    < > = values in this interval not displayed.    Estimated Creatinine Clearance: 47.5 mL/min (by C-G formula based on SCr of 0.93 mg/dL).   Medications:  Scheduled:  . pantoprazole (PROTONIX) IV  40 mg Intravenous QHS    Assessment: Patient has a PMH significant for Afib. Pharmacy was consulted for anticoagulant management while in the inpatient setting. Patient was taking a DOAC at home.  3/20 @ 0947 aPTT 44(baseline) 3/20 @ 1753 aPTT 103 supratherapeutic  3/20 @ 0947 HL > 3.60(baseline)  3/20 @ 0947 INR 2.0(baseline) 3/21 @ 0100 APTT 101.HL= 3.46.  Level is at the high end of therapeutic. Will decrease heparin infusion to 900 units/hr.   Goal of Therapy:  Heparin level 0.3-0.7 units/ml aPTT 66-102 seconds Monitor platelets by anticoagulation protocol: Yes   Plan:  3/21 @ 1015 aPTT= 80. Will continue current drip rate of 900 units/hr.  Recheck aPTT and HL with am labs. CBC ordered daily with AM labs per protocol.   Continue to monitor H&H and  platelets  Noralee Space, PharmD, BCPS Clinical Pharmacist 09/14/2018 11:19 AM

## 2018-09-15 LAB — BASIC METABOLIC PANEL
Anion gap: 9 (ref 5–15)
BUN: 19 mg/dL (ref 8–23)
CALCIUM: 8.4 mg/dL — AB (ref 8.9–10.3)
CO2: 27 mmol/L (ref 22–32)
Chloride: 102 mmol/L (ref 98–111)
Creatinine, Ser: 0.76 mg/dL (ref 0.44–1.00)
GFR calc Af Amer: >60 mL/min (ref >60)
GFR calc non Af Amer: >60 mL/min (ref >60)
Glucose, Bld: 98 mg/dL (ref 70–99)
Potassium: 3.8 mmol/L (ref 3.5–5.1)
Sodium: 138 mmol/L (ref 135–145)

## 2018-09-15 LAB — CBC
HCT: 40.8 % (ref 36.0–46.0)
Hemoglobin: 13.9 g/dL (ref 12.0–15.0)
MCH: 33.4 pg (ref 26.0–34.0)
MCHC: 34.1 g/dL (ref 30.0–36.0)
MCV: 98.1 fL (ref 80.0–100.0)
Platelets: 102 10*3/uL — ABNORMAL LOW (ref 150–400)
RBC: 4.16 MIL/uL (ref 3.87–5.11)
RDW: 13 % (ref 11.5–15.5)
WBC: 6.3 10*3/uL (ref 4.0–10.5)
nRBC: 0 % (ref 0.0–0.2)

## 2018-09-15 LAB — HEPARIN LEVEL (UNFRACTIONATED): Heparin Unfractionated: 1.37 IU/mL — ABNORMAL HIGH (ref 0.30–0.70)

## 2018-09-15 LAB — APTT
aPTT: 96 seconds — ABNORMAL HIGH (ref 24–36)
aPTT: 65 seconds — ABNORMAL HIGH (ref 24–36)

## 2018-09-15 MED ORDER — ACETAMINOPHEN 325 MG PO TABS: 650.0000 mg | ORAL_TABLET | Freq: Four times a day (QID) | ORAL | Status: DC | PRN

## 2018-09-15 MED ORDER — KETOROLAC TROMETHAMINE 15 MG/ML IJ SOLN
7.5000 mg | Freq: Four times a day (QID) | INTRAMUSCULAR | Status: DC | PRN
  Filled 2018-09-15: qty 1

## 2018-09-15 MED ORDER — ACETAMINOPHEN 650 MG RE SUPP: 650.0000 mg | Freq: Four times a day (QID) | RECTAL | Status: DC | PRN

## 2018-09-15 MED ORDER — LABETALOL HCL 5 MG/ML IV SOLN
10.0000 mg | INTRAVENOUS | Status: DC | PRN
  Administered 2018-09-15 – 2018-09-16 (×3): 10 mg via INTRAVENOUS
  Filled 2018-09-15 (×3): qty 4

## 2018-09-15 MED ORDER — NITROGLYCERIN 2 % TD OINT
0.5000 [in_us] | TOPICAL_OINTMENT | Freq: Four times a day (QID) | TRANSDERMAL | Status: DC
  Administered 2018-09-15 – 2018-09-16 (×4): 0.5 [in_us] via TOPICAL
  Filled 2018-09-15 (×4): qty 1

## 2018-09-15 NOTE — Progress Notes (Signed)
[facility] at [place] NAME: Tiffany Shaffer    MR#:  [id number]  DATE OF BIRTH:  01/19/1932  SUBJECTIVE:  CHIEF COMPLAINT: Patient is feeling ok, denies any headache.  Blood pressure is elevated positive flatus.  No bowel movement yet.  Denies any chest pain or palpitations  REVIEW OF SYSTEMS:  CONSTITUTIONAL: No fever, fatigue or weakness.  EYES: No blurred or double vision.  EARS, NOSE, AND THROAT: No tinnitus or ear pain.  RESPIRATORY: No cough, shortness of breath, wheezing or hemoptysis.  CARDIOVASCULAR: No chest pain, orthopnea, edema.  GASTROINTESTINAL: No nausea, vomiting, diarrhea or abdominal pain. Positive flatus GENITOURINARY: No dysuria, hematuria.  ENDOCRINE: No polyuria, nocturia,  HEMATOLOGY: No anemia, easy bruising or bleeding SKIN: No rash or lesion. MUSCULOSKELETAL: No joint pain or arthritis.   NEUROLOGIC: No tingling, numbness, weakness.  PSYCHIATRY: No anxiety or depression.   DRUG ALLERGIES:   Allergies  Allergen Reactions  . Pravastatin Other (See Comments)    Muscle aches    VITALS:  Blood pressure (!) 187/103, pulse 79, temperature 98.4 F (36.9 C), temperature source Oral, resp. rate 18, height 5\' 5"  (1.651 m), weight 81.6 kg, SpO2 96 %.  PHYSICAL EXAMINATION:  GENERAL:  85 y.o.-year-old patient lying in the bed with no acute distress.  EYES: Pupils equal, round, reactive to light and accommodation. No scleral icterus. Extraocular muscles intact.  HEENT: Head atraumatic, normocephalic. Oropharynx and nasopharynx clear.  NECK:  Supple, no jugular venous distention. No thyroid enlargement, no tenderness.  LUNGS: Normal breath sounds bilaterally, no wheezing, rales,rhonchi or crepitation. No use of accessory muscles of respiration.  CARDIOVASCULAR: Irregularly irregular no murmurs, rubs, or gallops.  ABDOMEN: Soft, nontender, nondistended.  Hypoactive bowel sounds present.  EXTREMITIES: No pedal edema,  cyanosis, or clubbing.  NEUROLOGIC: Awake, alert and oriented x3 sensation intact. Gait not checked.  PSYCHIATRIC: The patient is alert and oriented x 3.  SKIN: No obvious rash, lesion, or ulcer.    LABORATORY PANEL:   CBC Recent Labs  Lab 09/15/18 0455  WBC 6.3  HGB 13.9  HCT 40.8  PLT 102*   ------------------------------------------------------------------------------------------------------------------  Chemistries  Recent Labs  Lab 09/13/18 0415  09/15/18 0455  NA 137   < > 138  K 4.1   < > 3.8  CL 99   < > 102  CO2 28   < > 27  GLUCOSE 120*   < > 98  BUN 15   < > 19  CREATININE 0.86   < > 0.76  CALCIUM 9.5   < > 8.4*  AST 34  --   --   ALT 30  --   --   ALKPHOS 68  --   --   BILITOT 1.0  --   --    < > = values in this interval not displayed.   ------------------------------------------------------------------------------------------------------------------  Cardiac Enzymes Recent Labs  Lab 09/13/18 0415  TROPONINI <0.03   ------------------------------------------------------------------------------------------------------------------  RADIOLOGY:  Dg Abd Portable 1v  Result Date: 09/14/2018 CLINICAL DATA:  Status post NG tube placement. EXAM: PORTABLE ABDOMEN - 1 VIEW COMPARISON:  Single-view of the chest 09/13/2018. FINDINGS: NG tube is in place with both the tip and side-port in the stomach. IMPRESSION: As above. Electronically Signed   By: Inge Rise M.D.   On: 09/14/2018 13:58    EKG:   Orders placed or performed during the hospital encounter of 09/13/18  . ED EKG  . ED EKG  .  EKG 12-Lead  . EKG 12-Lead    ASSESSMENT AND PLAN:    1.  Atrial fibrillation with rapid ventricular response  Clinically doing fine asymptomatic and heart rate is well controlled.   metoprolol  5 mg IV every hour as needed heart rate greater than 140.   Monitor on telemetry for now.   Holding Xarelto and on heparin drip.  2.  Accelerated hypertension when  she had pain vomiting and NG tube placement.   Metoprolol as needed Nitroglycerin patch Check TSH  3.  Partial small bowel obstruction.  NG tube intact  Surgery following.  Pain control and nausea control.  4.  History of DVT on heparin drip  5.  Peripheral vascular disease.  Patient on heparin drip    All the records are reviewed and case discussed with Care Management/Social Workerr. Management plans discussed with the patient, she is in agreement. Discussed with Dr. Rosana Hoes  CODE STATUS: Fc  TOTAL TIME TAKING CARE OF THIS PATIENT: 36  minutes.     Note: This dictation was prepared with Dragon dictation along with smaller phrase technology. Any transcriptional errors that result from this process are unintentional.   Nicholes Mango M.D on 09/15/2018 at 1:27 PM  Between 7am to 6pm - Pager - [phone] After 6pm go to [email or web address] - password [email or web address] [facility]  Office  [phone]  CC: Primary care physician; Sallee Lange, NP

## 2018-09-15 NOTE — Progress Notes (Signed)
ANTICOAGULATION CONSULT NOTE  Pharmacy Consult for Heparin Drip Indication: atrial fibrillation  Patient Measurements: Height: 5\' 5"  (165.1 cm) Weight: 180 lb (81.6 kg) IBW/kg (Calculated) : 57 Heparin Dosing Weight: 74.4 kg  Vital Signs: Temp: 98.8 F (37.1 C) (03/22 1057) Temp Source: Oral (03/22 1057) BP: 168/101 (03/22 1057) Pulse Rate: 99 (03/22 1057)  Labs: Recent Labs    09/13/18 0415  09/13/18 0947  09/14/18 0044 09/14/18 1015 09/15/18 0455  HGB 17.9*  --   --   --  13.6  --  13.9  HCT 51.5*  --   --   --  39.8  --  40.8  PLT 151  --   --   --  120*  --  102*  APTT  --   --  44*   < > 101* 80* 96*  LABPROT 24.9*  --  22.8*  --   --   --   --   INR 2.3*  --  2.0*  --   --   --   --   HEPARINUNFRC  --    < > >3.60*  --  3.46* 3.08* 1.37*  CREATININE 0.86  --   --   --  0.93  --  0.76  TROPONINI <0.03  --   --   --   --   --   --    < > = values in this interval not displayed.    Estimated Creatinine Clearance: 55.2 mL/min (by C-G formula based on SCr of 0.76 mg/dL).   Medications:  Scheduled:  . pantoprazole (PROTONIX) IV  40 mg Intravenous QHS    Assessment: Patient has a PMH significant for Afib. Pharmacy was consulted for anticoagulant management while in the inpatient setting. Patient was taking Xarelto at home: aPTT slightly elevated at baseline, heparin level > 3.6.   Heparin Course 3/20 started at 1050 units/hr 3/20 1753 aPTT 103s: rate decreased to 950 units/hr 3/21 0100 aPTT 101s: rate decreased to 900 units/hr 3/21 1015 aPTT 80s 3/22 0455 aPTT 96s: rate decreased to 850 units/hr 3/22 1323 aPTT 65s  Goal of Therapy:  Heparin level 0.3-0.7 units/ml aPTT 66-102 seconds Monitor platelets by anticoagulation protocol: Yes   Plan:  --aPTT is currently borderline subtherapeutic  --increase heparin infusion to 900 units/hr --repeat aPTT 8 hours after rate change --Hgb, PLT trending down: continue to monitor --Continue to monitor H&H and  platelets  Dallie Piles, PharmD 09/15/2018,12:04 PM

## 2018-09-15 NOTE — Progress Notes (Signed)
ANTICOAGULATION CONSULT NOTE - Follow Up Consult  Pharmacy Consult for Heparin Drip Indication: atrial fibrillation  Allergies  Allergen Reactions  . Pravastatin Other (See Comments)    Muscle aches    Patient Measurements: Height: 5\' 5"  (165.1 cm) Weight: 180 lb (81.6 kg) IBW/kg (Calculated) : 57 Heparin Dosing Weight: 74.4 kg  Vital Signs: Temp: 98.5 F (36.9 C) (03/21 2045) Temp Source: Oral (03/21 2045) BP: 159/93 (03/22 0125) Pulse Rate: 101 (03/22 0125)  Labs: Recent Labs    09/13/18 0415  09/13/18 0947  09/14/18 0044 09/14/18 1015 09/15/18 0455  HGB 17.9*  --   --   --  13.6  --  13.9  HCT 51.5*  --   --   --  39.8  --  40.8  PLT 151  --   --   --  120*  --  102*  APTT  --   --  44*   < > 101* 80* 96*  LABPROT 24.9*  --  22.8*  --   --   --   --   INR 2.3*  --  2.0*  --   --   --   --   HEPARINUNFRC  --    < > >3.60*  --  3.46* 3.08* 1.37*  CREATININE 0.86  --   --   --  0.93  --  0.76  TROPONINI <0.03  --   --   --   --   --   --    < > = values in this interval not displayed.    Estimated Creatinine Clearance: 55.2 mL/min (by C-G formula based on SCr of 0.76 mg/dL).   Medications:  Scheduled:  . pantoprazole (PROTONIX) IV  40 mg Intravenous QHS    Assessment: Patient has a PMH significant for Afib. Pharmacy was consulted for anticoagulant management while in the inpatient setting. Patient was taking a DOAC at home.  3/20 @ 0947 aPTT 44(baseline) 3/20 @ 1753 aPTT 103 supratherapeutic  3/20 @ 0947 HL > 3.60(baseline)  3/20 @ 0947 INR 2.0(baseline) 3/21 @ 0100 APTT 101.HL= 3.46.  Level is at the high end of therapeutic. Will decrease heparin infusion to 900 units/hr.  3/21 @ 1015 aPTT 80. Drip continued @ 900 units/hr.   Goal of Therapy:  Heparin level 0.3-0.7 units/ml aPTT 66-102 seconds Monitor platelets by anticoagulation protocol: Yes   Plan:  3/22 HL 1.37 and aPTT: 96. Levels are still not correlating. Will continue to adjust heparin  infusion based on aPTTs.   Since aPTT at the higher end of therapeutic and there was increase in aPTT from 80 to 96, will reduce heparin infusion rate to 850 unit/hr.  Will recheck confirmatory aPTT level in 8 hours.    Platelets 102, down from 120. Hgb stable.   Recheck aPTT, HL and CBC  daily with AM labs per protocol.   Continue to monitor H&H and platelets  Pernell Dupre, PharmD, BCPS Clinical Pharmacist 09/15/2018 5:27 AM

## 2018-09-15 NOTE — Progress Notes (Signed)
SURGICAL PROGRESS NOTE (cpt [phone])  Hospital Day(s): 2.   Post op day(s):  Tiffany Shaffer   Interval History: Patient seen and examined, no acute events or new complaints overnight. Patient reports she feels better since 57F NG tube was replaced with standard 68F NG tube, passed a single episode of flatus overnight, denies abdominal pain, nausea, fever/chills, CP, or SOB.  Review of Systems:  Constitutional: denies fever, chills  HEENT: denies cough or congestion  Respiratory: denies any shortness of breath  Cardiovascular: denies chest pain or palpitations  Gastrointestinal: abdominal pain, N/V, and bowel function as per interval history Genitourinary: denies burning with urination or urinary frequency Musculoskeletal: denies pain, decreased motor or sensation Integumentary: denies any other rashes or skin discolorations Neurological: denies HA or vision/hearing changes   Vital signs in last 24 hours: [min-max] current  Temp:  [98.1 F (36.7 C)-98.8 F (37.1 C)] 98.8 F (37.1 C) (03/22 1000) Pulse Rate:  [68-137] 89 (03/22 1000) Resp:  [17-20] 20 (03/21 2045) BP: (144-187)/(82-127) 187/127 (03/22 1000) SpO2:  [92 %-97 %] 94 % (03/22 0629)     Height: 5\' 5"  (165.1 cm) Weight: 81.6 kg BMI (Calculated): 29.95   Intake/Output this shift:  Total I/O In: 252.6 [I.V.:252.6] Out: 400 [Urine:400]   Intake/Output last 2 shifts:  @IOLAST2SHIFTS @   Physical Exam:  Constitutional: alert, cooperative and no distress  HENT: normocephalic without obvious abnormality  Eyes: PERRL, EOM's grossly intact and symmetric  Respiratory: breathing non-labored at rest  Cardiovascular: regular rate and sinus rhythm  Gastrointestinal: soft, non-tender, and non-distended Musculoskeletal: UE and LE FROM, no edema or wounds, motor and sensation grossly intact, NT   Labs:  CBC Latest Ref Rng & Units 09/15/2018 09/14/2018 09/13/2018  WBC 4.0 - 10.5 K/uL 6.3 11.4(H) 10.1  Hemoglobin 12.0 - 15.0 g/dL 13.9 13.6  17.9(H)  Hematocrit 36.0 - 46.0 % 40.8 39.8 51.5(H)  Platelets 150 - 400 K/uL 102(L) 120(L) 151   CMP Latest Ref Rng & Units 09/15/2018 09/14/2018 09/13/2018  Glucose 70 - 99 mg/dL 98 119(H) 120(H)  BUN 8 - 23 mg/dL 19 21 15   Creatinine 0.44 - 1.00 mg/dL 0.76 0.93 0.86  Sodium 135 - 145 mmol/L 138 138 137  Potassium 3.5 - 5.1 mmol/L 3.8 3.7 4.1  Chloride 98 - 111 mmol/L 102 101 99  CO2 22 - 32 mmol/L 27 28 28   Calcium 8.9 - 10.3 mg/dL 8.4(L) 8.0(L) 9.5  Total Protein 6.5 - 8.1 g/dL - - 8.0  Total Bilirubin 0.3 - 1.2 mg/dL - - 1.0  Alkaline Phos 38 - 126 U/L - - 68  AST 15 - 41 U/L - - 34  ALT 0 - 44 U/L - - 30    Assessment/Plan: (ICD-10's: K56.52) 85 y.o. femalewith resolved leukocytosis and clinically improved small bowel obstruction, likely attributable to post-surgical adhesions following prior partial colectomy for a polyp not amenable to endoscopic resection and abdominal hysterectomy, complicated by pertinent comorbidities including advanced chronological age, chronic therapeutic anticoagulation for atrial fibrillation with history of lower extremity arterial embolization, HTN, and DVT.  - NPO for now, IV fluids - continue nasogastric decompression for now - monitor ongoing abdominal exam and bowel function - anticipate symptomatic relief within 24 - 48 hours following NGT insertion, followed by "rumbling" the following day and flatus either the same day or the day following the "rumbling" with anticipated length of stay ~3 - 5 days with successful non-operative management for 8 of 10 patients with small bowel obstruction attributed to post-surgical adhesions -  medical management comorbidities, including heparin infusion, as per medical team             - surgical intervention if doesn't improve was also discussed - ambulation encouraged  All of the above findings and recommendations were discussed with the  patient and the medical team, and all of patient's questions were answered to her expressed satisfaction.  -- Marilynne Drivers Rosana Hoes, MD, [place]: [facility] - Partnering for exceptional care. Office: [phone]

## 2018-09-16 LAB — URINE CULTURE
Culture: 30000 — AB
Special Requests: NORMAL

## 2018-09-16 LAB — APTT
aPTT: 67 seconds — ABNORMAL HIGH (ref 24–36)
aPTT: 71 s — ABNORMAL HIGH (ref 24–36)

## 2018-09-16 LAB — CBC
HCT: 38.9 % (ref 36.0–46.0)
Hemoglobin: 13.1 g/dL (ref 12.0–15.0)
MCH: 33 pg (ref 26.0–34.0)
MCHC: 33.7 g/dL (ref 30.0–36.0)
MCV: 98 fL (ref 80.0–100.0)
Platelets: 102 10*3/uL — ABNORMAL LOW (ref 150–400)
RBC: 3.97 MIL/uL (ref 3.87–5.11)
RDW: 12.8 % (ref 11.5–15.5)
WBC: 7.3 10*3/uL (ref 4.0–10.5)
nRBC: 0 % (ref 0.0–0.2)

## 2018-09-16 LAB — BASIC METABOLIC PANEL
Anion gap: 9 (ref 5–15)
BUN: 12 mg/dL (ref 8–23)
CO2: 28 mmol/L (ref 22–32)
Calcium: 8.5 mg/dL — ABNORMAL LOW (ref 8.9–10.3)
Chloride: 100 mmol/L (ref 98–111)
Creatinine, Ser: 0.74 mg/dL (ref 0.44–1.00)
GFR calc Af Amer: >60 mL/min (ref >60)
GFR calc non Af Amer: >60 mL/min (ref >60)
Glucose, Bld: 94 mg/dL (ref 70–99)
Potassium: 4 mmol/L (ref 3.5–5.1)
Sodium: 137 mmol/L (ref 135–145)

## 2018-09-16 LAB — HEPARIN LEVEL (UNFRACTIONATED): Heparin Unfractionated: 0.49 [IU]/mL (ref 0.30–0.70)

## 2018-09-16 LAB — TSH: TSH: 4.199 u[IU]/mL (ref 0.350–4.500)

## 2018-09-16 MED ORDER — METOPROLOL TARTRATE 50 MG PO TABS
75.0000 mg | ORAL_TABLET | Freq: Two times a day (BID) | ORAL | Status: DC
  Administered 2018-09-16 – 2018-09-17 (×4): 75 mg via ORAL
  Filled 2018-09-16 (×3): qty 1

## 2018-09-16 MED ORDER — LABETALOL HCL 5 MG/ML IV SOLN: 10.0000 mg | INTRAVENOUS | Status: DC | PRN

## 2018-09-16 NOTE — Progress Notes (Signed)
[place] at [place] NAME: Tiffany Shaffer    MR#:  [id number]  DATE OF BIRTH:  1932/06/22  SUBJECTIVE:  CHIEF COMPLAINT:  The patient has no complaints.  She said she passed gas.  On NGT.  Heart rate was 130s this morning. REVIEW OF SYSTEMS:  CONSTITUTIONAL: No fever, fatigue or weakness.  EYES: No blurred or double vision.  EARS, NOSE, AND THROAT: No tinnitus or ear pain.  RESPIRATORY: No cough, shortness of breath, wheezing or hemoptysis.  CARDIOVASCULAR: No chest pain, orthopnea, edema.  GASTROINTESTINAL: No nausea, vomiting, diarrhea or abdominal pain. Positive flatus GENITOURINARY: No dysuria, hematuria.  ENDOCRINE: No polyuria, nocturia,  HEMATOLOGY: No anemia, easy bruising or bleeding SKIN: No rash or lesion. MUSCULOSKELETAL: No joint pain or arthritis.   NEUROLOGIC: No tingling, numbness, weakness.  PSYCHIATRY: No anxiety or depression.   DRUG ALLERGIES:   Allergies  Allergen Reactions  . Pravastatin Other (See Comments)    Muscle aches    VITALS:  Blood pressure 140/76, pulse 84, temperature 99 F (37.2 C), temperature source Oral, resp. rate 18, height 5\' 5"  (1.651 m), weight 81.6 kg, SpO2 93 %.  PHYSICAL EXAMINATION:  GENERAL:  85 y.o.-year-old patient lying in the bed with no acute distress.  EYES: Pupils equal, round, reactive to light and accommodation. No scleral icterus. Extraocular muscles intact.  HEENT: Head atraumatic, normocephalic. Oropharynx and nasopharynx clear.  NECK:  Supple, no jugular venous distention. No thyroid enlargement, no tenderness.  LUNGS: Normal breath sounds bilaterally, no wheezing, rales,rhonchi or crepitation. No use of accessory muscles of respiration.  CARDIOVASCULAR: Irregularly irregular no murmurs, rubs, or gallops.  ABDOMEN: Soft, nontender, nondistended.  Active bowel sounds present.  EXTREMITIES: No pedal edema, cyanosis, or clubbing.  NEUROLOGIC: Awake, alert and  oriented x3 sensation intact. Gait not checked.  PSYCHIATRIC: The patient is alert and oriented x 3.  SKIN: No obvious rash, lesion, or ulcer.  LABORATORY PANEL:   CBC Recent Labs  Lab 09/16/18 0022  WBC 7.3  HGB 13.1  HCT 38.9  PLT 102*   ------------------------------------------------------------------------------------------------------------------  Chemistries  Recent Labs  Lab 09/13/18 0415  09/16/18 0022  NA 137   < > 137  K 4.1   < > 4.0  CL 99   < > 100  CO2 28   < > 28  GLUCOSE 120*   < > 94  BUN 15   < > 12  CREATININE 0.86   < > 0.74  CALCIUM 9.5   < > 8.5*  AST 34  --   --   ALT 30  --   --   ALKPHOS 68  --   --   BILITOT 1.0  --   --    < > = values in this interval not displayed.   ------------------------------------------------------------------------------------------------------------------  Cardiac Enzymes Recent Labs  Lab 09/13/18 0415  TROPONINI <0.03   ------------------------------------------------------------------------------------------------------------------  RADIOLOGY:  No results found.  EKG:   Orders placed or performed during the hospital encounter of 09/13/18  . ED EKG  . ED EKG  . EKG 12-Lead  . EKG 12-Lead    ASSESSMENT AND PLAN:    1.  Atrial fibrillation with rapid ventricular response  Clinically doing fine asymptomatic and heart rate is well controlled. Continue labetalol IV as needed and heparin drip.  Resume p.o. Lopressor once on diet. Holding Xarelto.  2.  Accelerated hypertension when she had pain vomiting and NG tube placement.   Labetalol  as needed. Nitroglycerin patch  3.  Partial small bowel obstruction.   Remove NGT per surgeon.  Start clear liquid diet.  4.  History of DVT on heparin drip  5.  Peripheral vascular disease.  Patient on heparin drip  I discussed with surgical PA. All the records are reviewed and case discussed with 83 Care Management/Social Workerr. Management plans discussed  with the patient, she is in agreement. Discussed with Dr. Rosana Hoes  CODE STATUS: Fc  TOTAL TIME TAKING CARE OF THIS PATIENT: 32  minutes.  Note: This dictation was prepared with Dragon dictation along with smaller phrase technology. Any transcriptional errors that result from this process are unintentional.   Demetrios Loll M.D on 09/16/2018 at 1:50 PM  Between 7am to 6pm - Pager - [phone] After 6pm go to [email or web address] - password [email or web address] [facility]  Office  [phone]  CC: Primary care physician; Sallee Lange, NP

## 2018-09-16 NOTE — Care Management Important Message (Signed)
Important Message  Patient Details  Name: Tiffany Shaffer MRN: [id number] Date of Birth: 08-May-1932   Medicare Important Message Given:  Yes    Dannette Barbara 09/16/2018, 10:51 AM

## 2018-09-16 NOTE — Progress Notes (Signed)
HR more controlled.  Afib in low 100's.  Per Dr. Rosana Hoes, if patient has output less than 275ml over next 1.5hrs, it is ok to pull NG tube, start patient on clear diets and resume home medications.

## 2018-09-16 NOTE — Progress Notes (Signed)
ANTICOAGULATION CONSULT NOTE  Pharmacy Consult for Heparin Drip Indication: atrial fibrillation  Patient Measurements: Height: 5\' 5"  (165.1 cm) Weight: 180 lb (81.6 kg) IBW/kg (Calculated) : 57 Heparin Dosing Weight: 74.4 kg  Vital Signs: Temp: 99 F (37.2 C) (03/22 1941) Temp Source: Oral (03/22 1941) BP: 135/77 (03/22 2346) Pulse Rate: 104 (03/22 2346)  Labs: Recent Labs    09/13/18 0415  09/13/18 0947  09/14/18 0044 09/14/18 1015 09/15/18 0455 09/15/18 1323 09/16/18 0022  HGB 17.9*  --   --   --  13.6  --  13.9  --  13.1  HCT 51.5*  --   --   --  39.8  --  40.8  --  38.9  PLT 151  --   --   --  120*  --  102*  --  102*  APTT  --   --  44*   < > 101* 80* 96* 65* 67*  LABPROT 24.9*  --  22.8*  --   --   --   --   --   --   INR 2.3*  --  2.0*  --   --   --   --   --   --   HEPARINUNFRC  --    < > >3.60*  --  3.46* 3.08* 1.37*  --   --   CREATININE 0.86  --   --   --  0.93  --  0.76  --   --   TROPONINI <0.03  --   --   --   --   --   --   --   --    < > = values in this interval not displayed.    Estimated Creatinine Clearance: 55.2 mL/min (by C-G formula based on SCr of 0.76 mg/dL).   Medications:  Scheduled:  . nitroGLYCERIN  0.5 inch Topical Q6H  . pantoprazole (PROTONIX) IV  40 mg Intravenous QHS    Assessment: Patient has a PMH significant for Afib. Pharmacy was consulted for anticoagulant management while in the inpatient setting. Patient was taking Xarelto at home: aPTT slightly elevated at baseline, heparin level > 3.6.   Heparin Course 3/20 started at 1050 units/hr 3/20 1753 aPTT 103s: rate decreased to 950 units/hr 3/21 0100 aPTT 101s: rate decreased to 900 units/hr 3/21 1015 aPTT 80s 3/22 0455 aPTT 96s: rate decreased to 850 units/hr 3/22 1323 aPTT 65s Rate increased to 900 units/hr 3/23 0022 aPTT 67s  Goal of Therapy:  Heparin level 0.3-0.7 units/ml aPTT 66-102 seconds Monitor platelets by anticoagulation protocol: Yes   Plan:  3/23 @  0022 aPTT 67. Level is just therapeutic. Will continue current infusion rate of 900 units/hr and recheck confirmatory level in 8 hours.   --Continue to monitor Hgb, HCT, and PLT daily  Pernell Dupre, PharmD 09/16/2018,1:01 AM

## 2018-09-16 NOTE — Progress Notes (Addendum)
Patients HR is afib RVR sustaining in 120-130s.  10mg  labetalol given.  Will continue to monitor.    Addendum:  Dr. Bridgett Larsson aware.  He states when cleared by surgery, we will restart patients home medications.  She takes 75mg  metoprolol BID.

## 2018-09-16 NOTE — Progress Notes (Signed)
ANTICOAGULATION CONSULT NOTE  Pharmacy Consult for Heparin Drip Indication: atrial fibrillation  Patient Measurements: Height: 5\' 5"  (165.1 cm) Weight: 180 lb (81.6 kg) IBW/kg (Calculated) : 57 Heparin Dosing Weight: 74.4 kg  Vital Signs: Temp: 97.7 F (36.5 C) (03/23 0524) Temp Source: Oral (03/23 0524) BP: 145/68 (03/23 0653) Pulse Rate: 83 (03/23 0653)  Labs: Recent Labs    09/13/18 0947  09/14/18 0044 09/14/18 1015 09/15/18 0455 09/15/18 1323 09/16/18 0022  HGB  --    < > 13.6  --  13.9  --  13.1  HCT  --   --  39.8  --  40.8  --  38.9  PLT  --   --  120*  --  102*  --  102*  APTT 44*   < > 101* 80* 96* 65* 67*  LABPROT 22.8*  --   --   --   --   --   --   INR 2.0*  --   --   --   --   --   --   HEPARINUNFRC >3.60*  --  3.46* 3.08* 1.37*  --   --   CREATININE  --   --  0.93  --  0.76  --  0.74   < > = values in this interval not displayed.    Estimated Creatinine Clearance: 55.2 mL/min (by C-G formula based on SCr of 0.74 mg/dL).   Medications:  Scheduled:  . nitroGLYCERIN  0.5 inch Topical Q6H  . pantoprazole (PROTONIX) IV  40 mg Intravenous QHS    Assessment: Patient has a PMH significant for Afib. Pharmacy was consulted for anticoagulant management while in the inpatient setting. Patient was taking Xarelto at home: aPTT slightly elevated at baseline, heparin level > 3.6.   Heparin Course 3/20 started at 1050 units/hr 3/20 1753 aPTT 103s: rate decreased to 950 units/hr 3/21 0100 aPTT 101s: rate decreased to 900 units/hr 3/21 1015 aPTT 80s 3/22 0455 aPTT 96s: rate decreased to 850 units/hr 3/22 1323 aPTT 65s: rate increased to 900 units/hr 3/23 0022 aPTT 67s 3/23 0834 aPTT 71s, HL 0.49  Goal of Therapy:  Heparin level 0.3-0.7 units/ml aPTT 66-102 seconds Monitor platelets by anticoagulation protocol: Yes   Plan:  --aPTT and HL are currently therapeutic and correlating --maintain heparin infusion at 900 units/hr --Next HL tomorrow am with CBC  after rate change --aPTT levels will be discontinued since correlation is complete --Hgb, PLT trending down but seems to have stabilized: continue to monitor  Dallie Piles, PharmD 09/16/2018,7:16 AM

## 2018-09-16 NOTE — Progress Notes (Signed)
[facility] SURGICAL PROGRESS NOTE (cpt [phone])  Hospital Day(s): 3.   Post op day(s):  Tiffany Shaffer   Interval History: Patient seen and examined, no acute events or new complaints overnight. Patient reports continued improvement in her abdominal pain without complaints of fever, chills, nausea, or emesis. She does report passing flatus 1-2 times every 4-5 hours. No reports of BM. NGT has slowed to about 100 ccs in 3-4 hours which appears to be saliva. No other complaints. Mobilizing well.   Review of Systems:  Constitutional: denies fever, chills  Respiratory: denies any shortness of breath  Cardiovascular: denies chest pain or palpitations  Gastrointestinal: denies abdominal pain, N/V, or diarrhea/and bowel function as per interval history Genitourinary: denies burning with urination or urinary frequency   Vital signs in last 24 hours: [min-max] current  Temp:  [97.7 F (36.5 C)-99.2 F (37.3 C)] 99.2 F (37.3 C) (03/23 0739) Pulse Rate:  [68-112] 77 (03/23 0739) Resp:  [18-20] 20 (03/23 0739) BP: (134-191)/(68-138) 134/93 (03/23 0739) SpO2:  [90 %-96 %] 91 % (03/23 0739)     Height: 5\' 5"  (165.1 cm) Weight: 81.6 kg BMI (Calculated): 29.95   Intake/Output this shift:  Total I/O In: -  Out: 300 [Urine:300]     Physical Exam:  Constitutional: alert, cooperative and no distress  HENT: normocephalic without obvious abnormality, NGT in place  Eyes: PERRL, EOM's grossly intact and symmetric  Respiratory: breathing non-labored at rest  Cardiovascular: regular rate and sinus rhythm  Gastrointestinal: soft, non-tender, and non-distended Musculoskeletal: no edema or wounds, motor and sensation grossly intact, NT    Labs:  CBC Latest Ref Rng & Units 09/16/2018 09/15/2018 09/14/2018  WBC 4.0 - 10.5 K/uL 7.3 6.3 11.4(H)  Hemoglobin 12.0 - 15.0 g/dL 13.1 13.9 13.6  Hematocrit 36.0 - 46.0 % 38.9 40.8 39.8  Platelets 150 - 400 K/uL 102(L) 102(L) 120(L)   CMP Latest Ref Rng &  Units 09/16/2018 09/15/2018 09/14/2018  Glucose 70 - 99 mg/dL 94 98 119(H)  BUN 8 - 23 mg/dL 12 19 21   Creatinine 0.44 - 1.00 mg/dL 0.74 0.76 0.93  Sodium 135 - 145 mmol/L 137 138 138  Potassium 3.5 - 5.1 mmol/L 4.0 3.8 3.7  Chloride 98 - 111 mmol/L 100 102 101  CO2 22 - 32 mmol/L 28 27 28   Calcium 8.9 - 10.3 mg/dL 8.5(L) 8.4(L) 8.0(L)  Total Protein 6.5 - 8.1 g/dL - - -  Total Bilirubin 0.3 - 1.2 mg/dL - - -  Alkaline Phos 38 - 126 U/L - - -  AST 15 - 41 U/L - - -  ALT 0 - 44 U/L - - -     Imaging studies: No new pertinent imaging studies   Assessment/Plan: (ICD-10's: K56.52) 86 y.o.femalewithclinically improvedsmall bowel obstruction, likely attributable to post-surgical adhesions followingprior partial colectomy for a polyp not amenable to endoscopic resection and abdominal hysterectomy, complicated by pertinent comorbidities includingadvanced chronological age, chronic therapeutic anticoagulation for atrial fibrillation with history of lower extremity arterial embolization, HTN, and DVT.   - NPO for now, IV fluids   - If NGT output less than 200 cc at 1200 today will remove NGT and start clear liquids   - monitor ongoingabdominal exam andbowel function   - pain control prn, antiemetics prn  - medical management comorbidities, including heparin infusion,as per medical team; okay to restart home medications when NGT out - surgical intervention if doesn't improve was also discussed - ambulation encouraged   All of the above findings and  recommendations were discussed with the patient and the medical team, and all of patient's questions were answered to her expressed satisfaction.  -- Edison Simon, PA-C [facility] 09/16/2018, 11:08 AM [phone] M-F: 7am - 4pm

## 2018-09-17 LAB — CBC
HCT: 37.3 % (ref 36.0–46.0)
Hemoglobin: 12.8 g/dL (ref 12.0–15.0)
MCH: 33.1 pg (ref 26.0–34.0)
MCHC: 34.3 g/dL (ref 30.0–36.0)
MCV: 96.4 fL (ref 80.0–100.0)
PLATELETS: 105 10*3/uL — AB (ref 150–400)
RBC: 3.87 MIL/uL (ref 3.87–5.11)
RDW: 12.9 % (ref 11.5–15.5)
WBC: 6 10*3/uL (ref 4.0–10.5)
nRBC: 0 % (ref 0.0–0.2)

## 2018-09-17 LAB — HEPARIN LEVEL (UNFRACTIONATED): Heparin Unfractionated: 0.19 IU/mL — ABNORMAL LOW (ref 0.30–0.70)

## 2018-09-17 MED ORDER — PANTOPRAZOLE SODIUM 40 MG PO TBEC
40.0000 mg | DELAYED_RELEASE_TABLET | Freq: Every day | ORAL | Status: DC
  Administered 2018-09-17: 40 mg via ORAL
  Filled 2018-09-17: qty 1

## 2018-09-17 MED ORDER — METOPROLOL TARTRATE 50 MG PO TABS: 75.0000 mg | ORAL_TABLET | Freq: Two times a day (BID) | ORAL | Status: DC

## 2018-09-17 MED ORDER — RIVAROXABAN 20 MG PO TABS
20.0000 mg | ORAL_TABLET | Freq: Every day | ORAL | Status: DC
  Administered 2018-09-17: 20 mg via ORAL
  Filled 2018-09-17 (×2): qty 1

## 2018-09-17 MED ORDER — HEPARIN BOLUS VIA INFUSION
2400.0000 [IU] | Freq: Once | INTRAVENOUS | Status: AC
  Administered 2018-09-17: 2400 [IU] via INTRAVENOUS
  Filled 2018-09-17: qty 2400

## 2018-09-17 MED ORDER — CHLORTHALIDONE 25 MG PO TABS
25.0000 mg | ORAL_TABLET | Freq: Every day | ORAL | Status: DC | PRN
  Filled 2018-09-17: qty 1

## 2018-09-17 NOTE — Progress Notes (Signed)
[place] at [place] NAME: Tiffany Shaffer    MR#:  [id number]  DATE OF BIRTH:  1931/11/16  SUBJECTIVE:  CHIEF COMPLAINT:  The patient has no complaints. She tolerated clear liquid diet, no bowel movement so far. REVIEW OF SYSTEMS:  CONSTITUTIONAL: No fever, fatigue or weakness.  EYES: No blurred or double vision.  EARS, NOSE, AND THROAT: No tinnitus or ear pain.  RESPIRATORY: No cough, shortness of breath, wheezing or hemoptysis.  CARDIOVASCULAR: No chest pain, orthopnea, edema.  GASTROINTESTINAL: No nausea, vomiting, diarrhea or abdominal pain. GENITOURINARY: No dysuria, hematuria.  ENDOCRINE: No polyuria, nocturia,  HEMATOLOGY: No anemia, easy bruising or bleeding SKIN: No rash or lesion. MUSCULOSKELETAL: No joint pain or arthritis.   NEUROLOGIC: No tingling, numbness, weakness.  PSYCHIATRY: No anxiety or depression.   DRUG ALLERGIES:   Allergies  Allergen Reactions  . Pravastatin Other (See Comments)    Muscle aches    VITALS:  Blood pressure (!) 150/78, pulse 60, temperature 98.8 F (37.1 C), temperature source Oral, resp. rate 17, height 5\' 5"  (1.651 m), weight 81.6 kg, SpO2 90 %.  PHYSICAL EXAMINATION:  GENERAL:  85 y.o.-year-old patient lying in the bed with no acute distress.  EYES: Pupils equal, round, reactive to light and accommodation. No scleral icterus. Extraocular muscles intact.  HEENT: Head atraumatic, normocephalic. Oropharynx and nasopharynx clear.  NECK:  Supple, no jugular venous distention. No thyroid enlargement, no tenderness.  LUNGS: Normal breath sounds bilaterally, no wheezing, rales,rhonchi or crepitation. No use of accessory muscles of respiration.  CARDIOVASCULAR: Irregularly irregular no murmurs, rubs, or gallops.  ABDOMEN: Soft, nontender, nondistended.  Active bowel sounds present.  EXTREMITIES: No pedal edema, cyanosis, or clubbing.  NEUROLOGIC: Awake, alert and oriented x3 sensation intact.  Gait not checked.  PSYCHIATRIC: The patient is alert and oriented x 3.  SKIN: No obvious rash, lesion, or ulcer.  LABORATORY PANEL:   CBC Recent Labs  Lab 09/17/18 0428  WBC 6.0  HGB 12.8  HCT 37.3  PLT 105*   ------------------------------------------------------------------------------------------------------------------  Chemistries  Recent Labs  Lab 09/13/18 0415  09/16/18 0022  NA 137   < > 137  K 4.1   < > 4.0  CL 99   < > 100  CO2 28   < > 28  GLUCOSE 120*   < > 94  BUN 15   < > 12  CREATININE 0.86   < > 0.74  CALCIUM 9.5   < > 8.5*  AST 34  --   --   ALT 30  --   --   ALKPHOS 68  --   --   BILITOT 1.0  --   --    < > = values in this interval not displayed.   ------------------------------------------------------------------------------------------------------------------  Cardiac Enzymes Recent Labs  Lab 09/13/18 0415  TROPONINI <0.03   ------------------------------------------------------------------------------------------------------------------  RADIOLOGY:  No results found.  EKG:   Orders placed or performed during the hospital encounter of 09/13/18  . ED EKG  . ED EKG  . EKG 12-Lead  . EKG 12-Lead    ASSESSMENT AND PLAN:    1.  Atrial fibrillation with rapid ventricular response  Clinically doing fine asymptomatic and heart rate is well controlled. Continue labetalol IV as needed and off heparin drip.  Started diet. Resumed Lopressor and  xarelto.  2.  Accelerated hypertension when she had pain vomiting and NG tube placement.   Labetalol as needed.  Lopressor p.o. twice daily.  Nitroglycerin patch is discontinued.  3.  Partial small bowel obstruction.   Remove NGT per surgeon.  Start clear liquid diet.  4.  History of DVT, Resumed xarelto.  5.  Peripheral vascular disease.   Resumed xarelto.  All the records are reviewed and case discussed with Care Management/Social Workerr. Management plans discussed with the patient, she  is in agreement.  CODE STATUS: Fc  TOTAL TIME TAKING CARE OF THIS PATIENT: 26  minutes.  Note: This dictation was prepared with Dragon dictation along with smaller phrase technology. Any transcriptional errors that result from this process are unintentional.   Demetrios Loll M.D on 09/17/2018 at 11:22 AM  Between 7am to 6pm - Pager - [phone] After 6pm go to [email or web address] - password [email or web address] [facility]  Office  [phone]  CC: Primary care physician; Sallee Lange, NP

## 2018-09-17 NOTE — Progress Notes (Signed)
[facility] SURGICAL PROGRESS NOTE (cpt [phone])  Hospital Day(s): 4.   Post op day(s):  Marland Kitchen   Interval History: Patient seen and examined, NGT removed yesterday and started on CLD. Patient reports no abdominal pain, nausea, or emesis. She continues to report passing flatus but no BM yet. She has tolerated a diet without issue. Mobilizing well. No other complaints.   Review of Systems:  Constitutional: denies fever, chills  Respiratory: denies any shortness of breath  Cardiovascular: denies chest pain or palpitations  Gastrointestinal: denies abdominal pain, N/V, or diarrhea/and bowel function as per interval history Genitourinary: denies burning with urination or urinary frequency   Vital signs in last 24 hours: [min-max] current  Temp:  [98.5 F (36.9 C)-99.7 F (37.6 C)] 98.8 F (37.1 C) (03/24 0509) Pulse Rate:  [60-109] 60 (03/24 0509) Resp:  [17-20] 17 (03/24 0509) BP: (134-183)/(76-117) 150/78 (03/24 0509) SpO2:  [90 %-97 %] 90 % (03/24 0509)     Height: 5\' 5"  (165.1 cm) Weight: 81.6 kg BMI (Calculated): 29.95   Intake/Output this shift:  No intake/output data recorded.    Physical Exam:  Constitutional: alert, cooperative and no distress  HENT: normocephalic without obvious abnormality  Eyes: PERRL, EOM's grossly intact and symmetric  Respiratory: breathing non-labored at rest  Cardiovascular: regular rate and sinus rhythm  Gastrointestinal: soft, non-tender, and non-distended Musculoskeletal: no edema or wounds, motor and sensation grossly intact, NT    Labs:  CBC Latest Ref Rng & Units 09/17/2018 09/16/2018 09/15/2018  WBC 4.0 - 10.5 K/uL 6.0 7.3 6.3  Hemoglobin 12.0 - 15.0 g/dL 12.8 13.1 13.9  Hematocrit 36.0 - 46.0 % 37.3 38.9 40.8  Platelets 150 - 400 K/uL 105(L) 102(L) 102(L)   CMP Latest Ref Rng & Units 09/16/2018 09/15/2018 09/14/2018  Glucose 70 - 99 mg/dL 94 98 119(H)  BUN 8 - 23 mg/dL 12 19 21   Creatinine 0.44 - 1.00 mg/dL 0.74 0.76 0.93   Sodium 135 - 145 mmol/L 137 138 138  Potassium 3.5 - 5.1 mmol/L 4.0 3.8 3.7  Chloride 98 - 111 mmol/L 100 102 101  CO2 22 - 32 mmol/L 28 27 28   Calcium 8.9 - 10.3 mg/dL 8.5(L) 8.4(L) 8.0(L)  Total Protein 6.5 - 8.1 g/dL - - -  Total Bilirubin 0.3 - 1.2 mg/dL - - -  Alkaline Phos 38 - 126 U/L - - -  AST 15 - 41 U/L - - -  ALT 0 - 44 U/L - - -     Imaging studies: No new pertinent imaging studies   Assessment/Plan: (ICD-10's: K68.52) 85 y.o. female with clinically improved/resolved small bowel obstruction, likely attributable to post-surgical adhesions followingprior partial colectomy for a polyp not amenable to endoscopic resection and abdominal hysterectomy, complicated by pertinent comorbidities includingadvanced chronological age, chronic therapeutic anticoagulation for atrial fibrillation with history of lower extremity arterial embolization, HTN, and DVT.   - Full liquids (ADAT; softs for dinner), discontinue IVF  - monitor ongoingabdominal exam andbowel function              - pain control prn, antiemetics prn             - medical management comorbidities; home medicine restarted yesterday; will discontinue heparin & restart xarelto today.  - surgical intervention if doesn't improve was also discussed - ambulation encouraged   - discharge planning: Likely home tomorrow  All of the above findings and recommendations were discussed with the patient, the patient's daughter via telephone, and the medical team,  and all of patient's questions were answered to her expressed satisfaction.   -- Edison Simon, PA-C [facility] 09/17/2018, 7:14 AM [phone] M-F: 7am - 4pm

## 2018-09-17 NOTE — Progress Notes (Signed)
ANTICOAGULATION CONSULT NOTE  Pharmacy Consult for Heparin Drip Indication: atrial fibrillation  Patient Measurements: Height: 5\' 5"  (165.1 cm) Weight: 180 lb (81.6 kg) IBW/kg (Calculated) : 57 Heparin Dosing Weight: 74.4 kg  Vital Signs: Temp: 98.5 F (36.9 C) (03/23 1946) Temp Source: Oral (03/23 1946) BP: 163/88 (03/23 2252) Pulse Rate: 109 (03/23 2252)  Labs: Recent Labs    09/15/18 0455 09/15/18 1323 09/16/18 0022 09/16/18 0809 09/17/18 0428  HGB 13.9  --  13.1  --  12.8  HCT 40.8  --  38.9  --  37.3  PLT 102*  --  102*  --  105*  APTT 96* 65* 67* 71*  --   HEPARINUNFRC 1.37*  --   --  0.49 0.19*  CREATININE 0.76  --  0.74  --   --     Estimated Creatinine Clearance: 55.2 mL/min (by C-G formula based on SCr of 0.74 mg/dL).   Medications:  Scheduled:  . heparin  2,400 Units Intravenous Once  . metoprolol tartrate  75 mg Oral BID  . pantoprazole (PROTONIX) IV  40 mg Intravenous QHS    Assessment: Patient has a PMH significant for Afib. Pharmacy was consulted for anticoagulant management while in the inpatient setting. Patient was taking Xarelto at home: aPTT slightly elevated at baseline, heparin level > 3.6.   Heparin Course 3/20 started at 1050 units/hr 3/20 1753 aPTT 103s: rate decreased to 950 units/hr 3/21 0100 aPTT 101s: rate decreased to 900 units/hr 3/21 1015 aPTT 80s 3/22 0455 aPTT 96s: rate decreased to 850 units/hr 3/22 1323 aPTT 65s: rate increased to 900 units/hr 3/23 0022 aPTT 67s 3/23 0834 aPTT 71s, HL 0.49  Goal of Therapy:  Heparin level 0.3-0.7 units/ml aPTT 66-102 seconds Monitor platelets by anticoagulation protocol: Yes   Plan:  03/24 @ 0430 HL 0.19 subtherapeutic. Will bolus w/ heparin 2400 units IV x 1 and increase rate 1000 units/hr and will recheck HL @ 1300, CBC stable will continue to monitor.  Tobie Lords, PharmD, BCPS Clinical Pharmacist 09/17/2018

## 2018-09-18 NOTE — Progress Notes (Signed)
Discharge instructions reviewed with patient with verbalized understanding. Iv's d'cd. Patient stable. Discharged via private vehicle

## 2018-09-18 NOTE — Discharge Summary (Signed)
[facility] SURGICAL DISCHARGE SUMMARY (cpt: [phone])  Patient ID: Tiffany Shaffer MRN: [id number] DOB/AGE: 27-Jun-1931 85 y.o.  Admit date: 09/13/2018 Discharge date: 09/18/2018  Discharge Diagnoses Patient Active Problem List   Diagnosis Date Noted  . SBO (small bowel obstruction) ([place]) 09/13/2018  . Essential hypertension, benign 06/23/2016  . Atrial fibrillation ([place]) 06/23/2016  . PVD (peripheral vascular disease) ([place]) 06/23/2016    Consultants Medicine  Procedures None  HPI: 85 y.o. female presented to [facility] ED today for abdominal pain. Patient reports the acute onset of sharp lower abdominal pain around 6 PM last night. She thought this was gas pain and tried to have a BM but was unsuccessful. The pain continued to worsen throughout the night which prompted her visit to the ED. She does endorse associated nausea and emesis with the pain. No complaints of fevers, chills, CP, SOB, or urinary changes. No history of similar pain episodes. She does have an abdominal surgical history positive for abdominal hysterectomy and apparent sigmoid colectomy in 2001 for polyps. Of note, she does have a history of atrial fibrillation on Xarelto. Work up in the ED was concerning for partial small bowel obstruction.   Hospital Course: Patient was admitted to general surgery with medicine consult for management of atrial fibrillation. She had NGT placed to LIS and was monitored for 3 days. NGT was removed on HD3 and she was started on CLD. Advancement of patient's diet and ambulation were well-tolerated. The remainder of patient's hospital course was essentially unremarkable, and discharge planning was initiated accordingly with patient safely able to be discharged home with appropriate discharge instructions, pain control, and outpatient follow-up after all of her questions were answered to her expressed satisfaction.  Discharge Condition: Good   Physical Examination:  Constitutional:  alert, cooperative and no distress  HENT: normocephalic without obvious abnormality  Eyes: PERRL, EOM's grossly intact and symmetric  Respiratory: breathing non-labored at rest  Cardiovascular: regular rate and sinus rhythm  Gastrointestinal: soft, non-tender, and non-distended Musculoskeletal: no edema or wounds, motor and sensation grossly intact, NT    Allergies as of 09/18/2018      Reactions   Pravastatin Other (See Comments)   Muscle aches      Medication List    TAKE these medications   acetaminophen 500 MG tablet Commonly known as:  TYLENOL Take 500-1,000 mg by mouth every 6 (six) hours as needed for mild pain or fever.   chlorthalidone 50 MG tablet Commonly known as:  HYGROTON Take 25 mg by mouth daily as needed (fluid retention).   fluocinonide 0.05 % external solution Commonly known as:  LIDEX Apply 1 application topically 2 (two) times daily as needed (itching).   glucosamine-chondroitin 500-400 MG tablet Take 1 tablet by mouth daily.   metoprolol tartrate 25 MG tablet Commonly known as:  LOPRESSOR Take 75 mg by mouth 2 (two) times daily.   rivaroxaban 20 MG Tabs tablet Commonly known as:  XARELTO Take 20 mg by mouth daily with supper.        Follow-up Information    [facility] Follow up.   Specialty:  General Surgery Why:  Patient DOES NOT need a follow up. This is to provide the patient with our office's contact information in case issue were to arise in the future.  Contact information: [address]           -- Edison Simon , PA-C [facility]  09/18/2018, 9:33 AM  [phone] M-F: 7am - 4pm

## 2018-09-18 NOTE — Progress Notes (Signed)
[place] [facility] [place] NAME: Tiffany Shaffer    MR#:  [id number]  DATE OF BIRTH:  Jun 16, 1932  SUBJECTIVE:  CHIEF COMPLAINT:  The patient has no complaints. She tolerated soft diet. REVIEW OF SYSTEMS:  CONSTITUTIONAL: No fever, fatigue or weakness.  EYES: No blurred or double vision.  EARS, NOSE, AND THROAT: No tinnitus or ear pain.  RESPIRATORY: No cough, shortness of breath, wheezing or hemoptysis.  CARDIOVASCULAR: No chest pain, orthopnea, edema.  GASTROINTESTINAL: No nausea, vomiting, diarrhea or abdominal pain. GENITOURINARY: No dysuria, hematuria.  ENDOCRINE: No polyuria, nocturia,  HEMATOLOGY: No anemia, easy bruising or bleeding SKIN: No rash or lesion. MUSCULOSKELETAL: No joint pain or arthritis.   NEUROLOGIC: No tingling, numbness, weakness.  PSYCHIATRY: No anxiety or depression.   DRUG ALLERGIES:   Allergies  Allergen Reactions  . Pravastatin Other (See Comments)    Muscle aches    VITALS:  Blood pressure (!) 155/72, pulse 81, temperature 98 F (36.7 C), temperature source Oral, resp. rate 16, height 5\' 5"  (1.651 m), weight 81.6 kg, SpO2 91 %.  PHYSICAL EXAMINATION:  GENERAL:  85 y.o.-year-old patient lying in the bed with no acute distress.  EYES: Pupils equal, round, reactive to light and accommodation. No scleral icterus. Extraocular muscles intact.  HEENT: Head atraumatic, normocephalic. Oropharynx and nasopharynx clear.  NECK:  Supple, no jugular venous distention. No thyroid enlargement, no tenderness.  LUNGS: Normal breath sounds bilaterally, no wheezing, rales,rhonchi or crepitation. No use of accessory muscles of respiration.  CARDIOVASCULAR: Irregularly irregular no murmurs, rubs, or gallops.  ABDOMEN: Soft, nontender, nondistended.  Active bowel sounds present.  EXTREMITIES: No pedal edema, cyanosis, or clubbing.  NEUROLOGIC: Awake, alert and oriented x3 sensation intact. Gait not checked.  PSYCHIATRIC:  The patient is alert and oriented x 3.  SKIN: No obvious rash, lesion, or ulcer.  LABORATORY PANEL:   CBC Recent Labs  Lab 09/17/18 0428  WBC 6.0  HGB 12.8  HCT 37.3  PLT 105*   ------------------------------------------------------------------------------------------------------------------  Chemistries  Recent Labs  Lab 09/13/18 0415  09/16/18 0022  NA 137   < > 137  K 4.1   < > 4.0  CL 99   < > 100  CO2 28   < > 28  GLUCOSE 120*   < > 94  BUN 15   < > 12  CREATININE 0.86   < > 0.74  CALCIUM 9.5   < > 8.5*  AST 34  --   --   ALT 30  --   --   ALKPHOS 68  --   --   BILITOT 1.0  --   --    < > = values in this interval not displayed.   ------------------------------------------------------------------------------------------------------------------  Cardiac Enzymes Recent Labs  Lab 09/13/18 0415  TROPONINI <0.03   ------------------------------------------------------------------------------------------------------------------  RADIOLOGY:  No results found.  EKG:   Orders placed or performed during the hospital encounter of 09/13/18  . ED EKG  . ED EKG  . EKG 12-Lead  . EKG 12-Lead    ASSESSMENT AND PLAN:    1.  Atrial fibrillation with rapid ventricular response  Clinically doing fine asymptomatic and heart rate is well controlled. Continue labetalol IV as needed and off heparin drip.  She tolerated soft diet. Continue Lopressor and  xarelto.  2.  Accelerated hypertension when she had pain vomiting and NG tube placement.   Labetalol as needed.  Lopressor p.o. twice daily. Nitroglycerin patch is discontinued.  3.  Partial small bowel obstruction.   Removed NGT per surgeon.  She tolerated diet.  4.  History of DVT, Resumed xarelto.  5.  Peripheral vascular disease.   Resumed xarelto.  Medically stable. Discussed with surgical PA. All the records are reviewed and case discussed with Care Management/Social Workerr. Management plans discussed  with the patient, she is in agreement.  CODE STATUS: Fc  TOTAL TIME TAKING CARE OF THIS PATIENT: 26  minutes.  Note: This dictation was prepared with Dragon dictation along with smaller phrase technology. Any transcriptional errors that result from this process are unintentional.   Demetrios Loll M.D on 09/18/2018 at 8:59 AM  Between 7am to 6pm - Pager - [phone] After 6pm go to [email or web address] - password [email or web address] [facility]  Office  [phone]  CC: Primary care physician; Sallee Lange, NP

## 2018-10-01 DIAGNOSIS — I119 Hypertensive heart disease without heart failure: Secondary | ICD-10-CM | POA: Insufficient documentation

## 2018-11-19 ENCOUNTER — Other Ambulatory Visit: Payer: Self-pay | Admitting: Nurse Practitioner

## 2018-11-19 DIAGNOSIS — Z1231 Encounter for screening mammogram for malignant neoplasm of breast: Secondary | ICD-10-CM

## 2018-12-16 ENCOUNTER — Encounter (INDEPENDENT_AMBULATORY_CARE_PROVIDER_SITE_OTHER): Payer: Self-pay

## 2018-12-16 ENCOUNTER — Ambulatory Visit
Admission: RE | Admit: 2018-12-16 | Discharge: 2018-12-16 | Disposition: A | Discharge status: Home / Self Care | Payer: Medicare Other | Source: Ambulatory Visit | Attending: Nurse Practitioner | Admitting: Nurse Practitioner

## 2018-12-16 ENCOUNTER — Other Ambulatory Visit: Payer: Self-pay

## 2018-12-16 DIAGNOSIS — Z1231 Encounter for screening mammogram for malignant neoplasm of breast: Secondary | ICD-10-CM | POA: Diagnosis present

## 2018-12-19 ENCOUNTER — Other Ambulatory Visit: Payer: Self-pay | Admitting: Nurse Practitioner

## 2018-12-19 DIAGNOSIS — N6320 Unspecified lump in the left breast, unspecified quadrant: Secondary | ICD-10-CM

## 2018-12-19 DIAGNOSIS — R928 Other abnormal and inconclusive findings on diagnostic imaging of breast: Secondary | ICD-10-CM

## 2018-12-23 ENCOUNTER — Ambulatory Visit
Admission: RE | Admit: 2018-12-23 | Discharge: 2018-12-23 | Disposition: A | Discharge status: Home / Self Care | Payer: Medicare Other | Source: Ambulatory Visit | Attending: Nurse Practitioner | Admitting: Nurse Practitioner

## 2018-12-23 ENCOUNTER — Other Ambulatory Visit: Payer: Self-pay

## 2018-12-23 DIAGNOSIS — N6320 Unspecified lump in the left breast, unspecified quadrant: Secondary | ICD-10-CM

## 2018-12-23 DIAGNOSIS — R928 Other abnormal and inconclusive findings on diagnostic imaging of breast: Secondary | ICD-10-CM | POA: Diagnosis not present

## 2019-01-10 ENCOUNTER — Emergency Department: Payer: Medicare Other

## 2019-01-10 ENCOUNTER — Other Ambulatory Visit: Payer: Self-pay

## 2019-01-10 ENCOUNTER — Emergency Department
Admission: EM | Admit: 2019-01-10 | Discharge: 2019-01-10 | Disposition: A | Discharge status: Home / Self Care | Payer: Medicare Other | Attending: Emergency Medicine | Admitting: Emergency Medicine

## 2019-01-10 ENCOUNTER — Encounter: Payer: Self-pay | Admitting: Emergency Medicine

## 2019-01-10 DIAGNOSIS — I10 Essential (primary) hypertension: Secondary | ICD-10-CM | POA: Diagnosis not present

## 2019-01-10 DIAGNOSIS — R1010 Upper abdominal pain, unspecified: Secondary | ICD-10-CM | POA: Diagnosis present

## 2019-01-10 DIAGNOSIS — Z79899 Other long term (current) drug therapy: Secondary | ICD-10-CM | POA: Insufficient documentation

## 2019-01-10 DIAGNOSIS — Z7901 Long term (current) use of anticoagulants: Secondary | ICD-10-CM | POA: Diagnosis not present

## 2019-01-10 DIAGNOSIS — K8020 Calculus of gallbladder without cholecystitis without obstruction: Secondary | ICD-10-CM | POA: Diagnosis not present

## 2019-01-10 LAB — COMPREHENSIVE METABOLIC PANEL
ALT: 17 U/L (ref 0–44)
AST: 19 U/L (ref 15–41)
Albumin: 4.5 g/dL (ref 3.5–5.0)
Alkaline Phosphatase: 69 U/L (ref 38–126)
Anion gap: 13 (ref 5–15)
BUN: 19 mg/dL (ref 8–23)
CO2: 25 mmol/L (ref 22–32)
Calcium: 9.5 mg/dL (ref 8.9–10.3)
Chloride: 97 mmol/L — ABNORMAL LOW (ref 98–111)
Creatinine, Ser: 0.96 mg/dL (ref 0.44–1.00)
GFR calc Af Amer: >60 mL/min (ref >60)
GFR calc non Af Amer: 54 mL/min — ABNORMAL LOW (ref >60)
Glucose, Bld: 156 mg/dL — ABNORMAL HIGH (ref 70–99)
Potassium: 4.8 mmol/L (ref 3.5–5.1)
Sodium: 135 mmol/L (ref 135–145)
Total Bilirubin: 1.4 mg/dL — ABNORMAL HIGH (ref 0.3–1.2)
Total Protein: 7.6 g/dL (ref 6.5–8.1)

## 2019-01-10 LAB — CBC WITH DIFFERENTIAL/PLATELET
Abs Immature Granulocytes: 0.05 10*3/uL (ref 0.00–0.07)
Basophils Absolute: 0.1 10*3/uL (ref 0.0–0.1)
Basophils Relative: 1 %
Eosinophils Absolute: 0.2 10*3/uL (ref 0.0–0.5)
Eosinophils Relative: 2 %
HCT: 48.5 % — ABNORMAL HIGH (ref 36.0–46.0)
Hemoglobin: 16.4 g/dL — ABNORMAL HIGH (ref 12.0–15.0)
Immature Granulocytes: 1 %
Lymphocytes Relative: 11 %
Lymphs Abs: 1.2 10*3/uL (ref 0.7–4.0)
MCH: 32.3 pg (ref 26.0–34.0)
MCHC: 33.8 g/dL (ref 30.0–36.0)
MCV: 95.7 fL (ref 80.0–100.0)
Monocytes Absolute: 0.4 10*3/uL (ref 0.1–1.0)
Monocytes Relative: 4 %
Neutro Abs: 8.6 10*3/uL — ABNORMAL HIGH (ref 1.7–7.7)
Neutrophils Relative %: 81 %
Platelets: 151 10*3/uL (ref 150–400)
RBC: 5.07 MIL/uL (ref 3.87–5.11)
RDW: 13.7 % (ref 11.5–15.5)
WBC: 10.5 10*3/uL (ref 4.0–10.5)
nRBC: 0 % (ref 0.0–0.2)

## 2019-01-10 LAB — LIPASE, BLOOD: Lipase: 30 U/L (ref 11–51)

## 2019-01-10 MED ORDER — IOHEXOL 300 MG/ML  SOLN
100.0000 mL | Freq: Once | INTRAMUSCULAR | Status: AC | PRN
  Administered 2019-01-10: 100 mL via INTRAVENOUS

## 2019-01-10 NOTE — ED Provider Notes (Signed)
[facility] Emergency Department Provider Note    First MD Initiated Contact with Patient 01/10/19 [phone]     (approximate)  I have reviewed the triage vital signs and the nursing notes.   HISTORY  Chief Complaint Abdominal Pain    HPI Tiffany Shaffer is a 85 y.o. female with below list of previous medical conditions including recent bowel obstruction and diagnosis of cholelithiasis March of this year.  Patient was unaware of the cholelithiasis diagnosis.  Patient presents to the emergency department tonight secondary to upper abdominal discomfort which patient states started before she went to bed and persisted while she was trying to go to sleep to the point that she could not sleep.  Patient states that her current pain score is 5 out of 10.  Patient denies any aggravating or alleviating factors.  Patient denies any fever however she does admit to nausea.  Patient denies any vomiting.  Patient denies any urinary symptoms no diarrhea or constipation.        Past Medical History:  Diagnosis Date  . Atrial fibrillation ([place])   . Cancer ([place]) 08/29/2016   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  . Headache    behind right eye  . History of blood clots 07/10/2013   Left lower leg  . PVD (peripheral vascular disease) ([place])   . SVT (supraventricular tachycardia) ([place])   . Wears dentures    full upper, p[artial lower    Patient Active Problem List   Diagnosis Date Noted  . SBO (small bowel obstruction) ([place]) 09/13/2018  . Essential hypertension, benign 06/23/2016  . Atrial fibrillation ([place]) 06/23/2016  . PVD (peripheral vascular disease) ([place]) 06/23/2016    Past Surgical History:  Procedure Laterality Date  . ABDOMINAL HYSTERECTOMY    . angiogram legs    . BREAST BIOPSY Left    neg  . BREAST BIOPSY Right    neg  . BROW LIFT Bilateral 07/10/2017   Procedure: BLEPHAROPLASTY  UPPER EYELID W EXCESS SKIN;  Surgeon: Karle Starch, MD;  Location: [facility];  Service: Ophthalmology;  Laterality: Bilateral;  . cataracts    . COLON RESECTION    . HERNIA REPAIR    . PTOSIS REPAIR Bilateral 07/10/2017   Procedure: PTOSIS REPAIR RESECT EX;  Surgeon: Karle Starch, MD;  Location: [place];  Service: Ophthalmology;  Laterality: Bilateral;    Prior to Admission medications   Medication Sig Start Date End Date Taking? Authorizing Provider  acetaminophen (TYLENOL) 500 MG tablet Take 500-1,000 mg by mouth every 6 (six) hours as needed for mild pain or fever.     [provider]  chlorthalidone (HYGROTON) 50 MG tablet Take 25 mg by mouth daily as needed (fluid retention).     [provider]  fluocinonide (LIDEX) 0.05 % external solution Apply 1 application topically 2 (two) times daily as needed (itching).     [provider]  glucosamine-chondroitin 500-400 MG tablet Take 1 tablet by mouth daily.    [provider]  metoprolol tartrate (LOPRESSOR) 25 MG tablet Take 75 mg by mouth 2 (two) times daily.    [provider]  rivaroxaban (XARELTO) 20 MG TABS tablet Take 20 mg by mouth daily with supper.    [provider]    Allergies Pravastatin  Family History  Problem Relation Age of Onset  . CVA Mother   . CAD Father   . Breast cancer Neg Hx     Social  History Social History   Tobacco Use  . Smoking status: Never Smoker  . Smokeless tobacco: Never Used  Substance Use Topics  . Alcohol use: Yes    Alcohol/week: 14.0 standard drinks    Types: 14 Shots of liquor per week  . Drug use: No    Review of Systems Constitutional: No fever/chills Eyes: No visual changes. ENT: No sore throat. Cardiovascular: Denies chest pain. Respiratory: Denies shortness of breath. Gastrointestinal: Positive for abdominal pain and nausea Genitourinary: Negative for dysuria. Musculoskeletal: Negative for neck pain.  Negative for back pain. Integumentary: Negative for  rash. Neurological: Negative for headaches, focal weakness or numbness.  ____________________________________________   PHYSICAL EXAM:  VITAL SIGNS: ED Triage Vitals  Enc Vitals Group     BP 01/10/19 0106 (!) 191/100     Pulse Rate 01/10/19 0106 (!) 102     Resp 01/10/19 0106 18     Temp 01/10/19 0106 98.5 F (36.9 C)     Temp Source 01/10/19 0106 Oral     SpO2 01/10/19 0106 97 %     Weight --      Height --      Head Circumference --      Peak Flow --      Pain Score 01/10/19 0104 5     Pain Loc --      Pain Edu? --      Excl. in [place]? --     Constitutional: Alert and oriented. Well appearing and in no acute distress. Eyes: Conjunctivae are normal.  Mouth/Throat: Mucous membranes are moist.  Oropharynx non-erythematous. Neck: No stridor.   Cardiovascular: Normal rate, regular rhythm. Good peripheral circulation. Grossly normal heart sounds. Respiratory: Normal respiratory effort.  No retractions. No audible wheezing. Gastrointestinal: Right upper quadrant tenderness to palpation.. No distention.  Musculoskeletal: No lower extremity tenderness nor edema. No gross deformities of extremities. Neurologic:  Normal speech and language. No gross focal neurologic deficits are appreciated.  Skin:  Skin is warm, dry and intact. No rash noted. Psychiatric: Mood and affect are normal. Speech and behavior are normal.    RADIOLOGY I, [place], personally viewed and evaluated these images (plain radiographs) as part of my medical decision making, as well as reviewing the written report by the radiologist.  ED MD interpretation: CT abdomen revealed no acute findings cholelithiasis without cholecystitis noted per radiologist  Official radiology report(s): Ct Abdomen Pelvis W Contrast  Result Date: 01/10/2019 CLINICAL DATA:  Nausea and abdominal pain EXAM: CT ABDOMEN AND PELVIS WITH CONTRAST TECHNIQUE: Multidetector CT imaging of the abdomen and pelvis was performed using the  standard protocol following bolus administration of intravenous contrast. CONTRAST:  187mL OMNIPAQUE IOHEXOL 300 MG/ML  SOLN COMPARISON:  09/13/2018 FINDINGS: Lower chest: Cardiomegaly and atherosclerosis. Hepatobiliary: No focal liver abnormality.Cholelithiasis. No evidence of cholecystitis. Pancreas: Unremarkable. Spleen: Long-standing, benign low-density at the hilum measuring 13 mm. Adrenals/Urinary Tract: Bilateral adrenal nodule measuring up to 2.5 cm on the right, adenoma based on multi year stability. No hydronephrosis or stone. Unremarkable bladder. Stomach/Bowel: No bowel obstruction or inflammatory changes. Postoperative transverse colon Vascular/Lymphatic: No acute vascular abnormality. Diffuse atherosclerotic calcification. Stenosis at the celiac and SMA origins without change. Major vessels are enhancing. No mass or adenopathy. Reproductive:Hysterectomy. Other: Trace pelvic fluid that is nonspecific in isolation. Musculoskeletal: No acute abnormalities. Degenerative disease with scoliosis and L3-4 anterolisthesis. High-grade spinal stenosis at L3-4. IMPRESSION: 1. No acute finding. 2. Cholelithiasis without cholecystitis. 3. Atherosclerosis with chronic visceral stenoses. Electronically Signed  By: Monte Fantasia M.D.   On: 01/10/2019 04:47     Procedures   ____________________________________________   INITIAL IMPRESSION / MDM / ASSESSMENT AND PLAN / ED COURSE  As part of my medical decision making, I reviewed the following data within the [facility] NUMBER   85 year old female presenting with above-stated history and physical exam secondary to abdominal pain and nausea.  Concern for possible recurrence of bowel obstruction versus cholelithiasis/cholecystitis.  Laboratory data unremarkable CT scan of the abdomen pelvis consistent with cholelithiasis without any evidence of cholecystitis.  Patient pain-free at present.  Patient will be discharged home with outpatient follow-up  with Dr. Lysle Pearl.  I spoke with the patient at length regarding warning signs of cholelithiasis cholecystitis and ascending cholangitis.  Patient advised to return to emergency department immediately if any of those symptoms were to occur.     ____________________________________________  FINAL CLINICAL IMPRESSION(S) / ED DIAGNOSES  Final diagnoses:  Calculus of gallbladder without cholecystitis without obstruction     MEDICATIONS GIVEN DURING THIS VISIT:  Medications  iohexol (OMNIPAQUE) 300 MG/ML solution 100 mL (100 mLs Intravenous Contrast Given 01/10/19 0423)     ED Discharge Orders    None      *Please note:  Tiffany Shaffer was evaluated in Emergency Department on 01/10/2019 for the symptoms described in the history of present illness. She was evaluated in the context of the global COVID-19 pandemic, which necessitated consideration that the patient might be at risk for infection with the SARS-CoV-2 virus that causes COVID-19. Institutional protocols and algorithms that pertain to the evaluation of patients at risk for COVID-19 are in a state of rapid change based on information released by regulatory bodies including the CDC and federal and state organizations. These policies and algorithms were followed during the patient's care in the ED.  Some ED evaluations and interventions may be delayed as a result of limited staffing during the pandemic.*  Note:  This document was prepared using Dragon voice recognition software and may include unintentional dictation errors.   Gregor Hams, MD 01/10/19 [phone]

## 2019-01-10 NOTE — ED Triage Notes (Signed)
Patient ambulatory to triage with steady gait, without difficulty or distress noted, mask in place; pt reports umbilical pain radiating to left lower abd accomp by nausea; st hx obstruction but has had BM

## 2019-05-26 ENCOUNTER — Other Ambulatory Visit: Payer: Self-pay | Admitting: Nurse Practitioner

## 2019-05-26 DIAGNOSIS — N6320 Unspecified lump in the left breast, unspecified quadrant: Secondary | ICD-10-CM

## 2019-06-24 ENCOUNTER — Other Ambulatory Visit: Payer: Self-pay

## 2019-06-24 ENCOUNTER — Ambulatory Visit (INDEPENDENT_AMBULATORY_CARE_PROVIDER_SITE_OTHER): Payer: Medicare Other | Admitting: Nurse Practitioner

## 2019-06-24 ENCOUNTER — Ambulatory Visit (INDEPENDENT_AMBULATORY_CARE_PROVIDER_SITE_OTHER): Payer: Medicare Other

## 2019-06-24 ENCOUNTER — Encounter (INDEPENDENT_AMBULATORY_CARE_PROVIDER_SITE_OTHER): Payer: Self-pay | Admitting: Nurse Practitioner

## 2019-06-24 VITALS — BP 156/82 | HR 78 | Resp 16 | Wt 187.8 lb

## 2019-06-24 DIAGNOSIS — I10 Essential (primary) hypertension: Secondary | ICD-10-CM

## 2019-06-24 DIAGNOSIS — I739 Peripheral vascular disease, unspecified: Secondary | ICD-10-CM | POA: Diagnosis not present

## 2019-06-24 DIAGNOSIS — I4891 Unspecified atrial fibrillation: Secondary | ICD-10-CM | POA: Diagnosis not present

## 2019-06-25 ENCOUNTER — Ambulatory Visit
Admission: RE | Admit: 2019-06-25 | Discharge: 2019-06-25 | Disposition: A | Discharge status: Home / Self Care | Payer: Medicare Other | Source: Ambulatory Visit | Attending: Nurse Practitioner | Admitting: Nurse Practitioner

## 2019-06-25 DIAGNOSIS — N6320 Unspecified lump in the left breast, unspecified quadrant: Secondary | ICD-10-CM | POA: Insufficient documentation

## 2019-06-28 ENCOUNTER — Encounter (INDEPENDENT_AMBULATORY_CARE_PROVIDER_SITE_OTHER): Payer: Self-pay | Admitting: Nurse Practitioner

## 2019-06-28 NOTE — Progress Notes (Signed)
SUBJECTIVE:  Patient ID: Tiffany Shaffer, female    DOB: September 30, 1931, 86 y.o.   MRN: [id number] Chief Complaint  Patient presents with  . Follow-up    u/s follow up    HPI  Tiffany Shaffer is a 86 y.o. female The patient returns to the office for followup and review of the noninvasive studies. There have been no interval changes in lower extremity symptoms. No interval shortening of the patient's claudication distance or development of rest pain symptoms. No new ulcers or wounds have occurred since the last visit.  There have been no significant changes to the patient's overall health care.  The patient denies amaurosis fugax or recent TIA symptoms. There are no recent neurological changes noted. The patient denies history of DVT, PE or superficial thrombophlebitis. The patient denies recent episodes of angina or shortness of breath.   ABI Rt=[place] and Lt=1.03  (previous ABI's Rt=1.34 and Lt=1.01) Duplex ultrasound of the right lower extremity shows strong monophasic tibial artery waveforms and the left lower extremity has biphasic waveforms in the anterior/peroneal tibial and an absent posterior tibial artery.  Strong toe waveforms bilaterally.   Past Medical History:  Diagnosis Date  . Atrial fibrillation ([place])   . Cancer ([place]) 08/29/2016   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  . Headache    behind right eye  . History of blood clots 07/10/2013   Left lower leg  . PVD (peripheral vascular disease) ([place])   . SVT (supraventricular tachycardia) ([place])   . Wears dentures    full upper, p[artial lower    Past Surgical History:  Procedure Laterality Date  . ABDOMINAL HYSTERECTOMY    . angiogram legs    . BREAST BIOPSY Left    neg  . BREAST BIOPSY Right    neg  . BROW LIFT Bilateral 07/10/2017   Procedure: BLEPHAROPLASTY  UPPER EYELID W EXCESS SKIN;  Surgeon: Karle Starch, MD;  Location: [facility];  Service: Ophthalmology;  Laterality: Bilateral;  . cataracts    .  COLON RESECTION    . HERNIA REPAIR    . PTOSIS REPAIR Bilateral 07/10/2017   Procedure: PTOSIS REPAIR RESECT EX;  Surgeon: Karle Starch, MD;  Location: [facility];  Service: Ophthalmology;  Laterality: Bilateral;    Social History   Socioeconomic History  . Marital status: Widowed    Spouse name: Not on file  . Number of children: Not on file  . Years of education: Not on file  . Highest education level: Not on file  Occupational History  . Not on file  Tobacco Use  . Smoking status: Never Smoker  . Smokeless tobacco: Never Used  Substance and Sexual Activity  . Alcohol use: Yes    Alcohol/week: 14.0 standard drinks    Types: 14 Shots of liquor per week  . Drug use: No  . Sexual activity: Not on file  Other Topics Concern  . Not on file  Social History Narrative  . Not on file   Social Determinants of Health   Financial Resource Strain:   . Difficulty of Paying Living Expenses: Not on file  Food Insecurity:   . Worried About Charity fundraiser in the Last Year: Not on file  . Ran Out of Food in the Last Year: Not on file  Transportation Needs:   . Lack of Transportation (Medical): Not on file  . Lack of Transportation (Non-Medical): Not on file  Physical Activity:   . Days of  Exercise per Week: Not on file  . Minutes of Exercise per Session: Not on file  Stress:   . Feeling of Stress : Not on file  Social Connections:   . Frequency of Communication with Friends and Family: Not on file  . Frequency of Social Gatherings with Friends and Family: Not on file  . Attends Religious Services: Not on file  . Active Member of Clubs or Organizations: Not on file  . Attends Archivist Meetings: Not on file  . Marital Status: Not on file  Intimate Partner Violence:   . Fear of Current or Ex-Partner: Not on file  . Emotionally Abused: Not on file  . Physically Abused: Not on file  . Sexually Abused: Not on file    Family History  Problem Relation Age  of Onset  . CVA Mother   . CAD Father   . Breast cancer Neg Hx     Allergies  Allergen Reactions  . Pravastatin Other (See Comments)    Muscle aches     Review of Systems   Review of Systems: Negative Unless Checked Constitutional: [] Weight loss  [] Fever  [] Chills Cardiac: [] Chest pain   []  Atrial Fibrillation  [] Palpitations   [] Shortness of breath when laying flat   [] Shortness of breath with exertion. [] Shortness of breath at rest Vascular:  [] Pain in legs with walking   [] Pain in legs with standing [] Pain in legs when laying flat   [] Claudication    [] Pain in feet when laying flat    [] History of DVT   [] Phlebitis   [x] Swelling in legs   [x] Varicose veins   [] Non-healing ulcers Pulmonary:   [] Uses home oxygen   [] Productive cough   [] Hemoptysis   [] Wheeze  [] COPD   [] Asthma Neurologic:  [] Dizziness   [] Seizures  [] Blackouts [] History of stroke   [] History of TIA  [] Aphasia   [] Temporary Blindness   [] Weakness or numbness in arm   [] Weakness or numbness in leg Musculoskeletal:   [] Joint swelling   [] Joint pain   [] Low back pain  []  History of Knee Replacement [x] Arthritis [] back Surgeries  []  Spinal Stenosis    Hematologic:  [] Easy bruising  [] Easy bleeding   [] Hypercoagulable state   [] Anemic Gastrointestinal:  [] Diarrhea   [] Vomiting  [] Gastroesophageal reflux/heartburn   [] Difficulty swallowing. [] Abdominal pain Genitourinary:  [] Chronic kidney disease   [] Difficult urination  [] Anuric   [] Blood in urine [] Frequent urination  [] Burning with urination   [] Hematuria Skin:  [] Rashes   [] Ulcers [] Wounds Psychological:  [] History of anxiety   []  History of major depression  []  Memory Difficulties      OBJECTIVE:   Physical Exam  BP (!) 156/82 (BP Location: Right Arm)   Pulse 78   Resp 16   Wt 187 lb 12.8 oz (85.2 kg)   BMI 31.25 kg/m   Gen: WD/WN, NAD Head: [place]/AT, No temporalis wasting.  Ear/Nose/Throat: Hearing grossly intact, nares w/o erythema or drainage Eyes: PER,  EOMI, sclera nonicteric.  Neck: Supple, no masses.  No JVD.  Pulmonary:  Good air movement, no use of accessory muscles.  Cardiac: RRR Vascular: minimal edema bilaterally Vessel Right Left  Radial Palpable Palpable  Dorsalis Pedis Palpable Palpable  Posterior Tibial Palpable Palpable   Gastrointestinal: soft, non-distended. No guarding/no peritoneal signs.  Musculoskeletal: M/S 5/5 throughout.  No deformity or atrophy.  Neurologic: Pain and light touch intact in extremities.  Symmetrical.  Speech is fluent. Motor exam as listed above. Psychiatric: Judgment intact, Mood & affect  appropriate for pt's clinical situation. Dermatologic: No Venous rashes. No Ulcers Noted.  No changes consistent with cellulitis. Lymph : No Cervical lymphadenopathy, no lichenification or skin changes of chronic lymphedema.       ASSESSMENT AND PLAN:  1. PVD (peripheral vascular disease) ([place])  Recommend:  The patient has evidence of atherosclerosis of the lower extremities with claudication.  The patient does not voice lifestyle limiting changes at this point in time.  Noninvasive studies do not suggest clinically significant change.  No invasive studies, angiography or surgery at this time The patient should continue walking and begin a more formal exercise program.  The patient should continue antiplatelet therapy and aggressive treatment of the lipid abnormalities  No changes in the patient's medications at this time  The patient should continue wearing graduated compression socks 10-15 mmHg strength to control the mild edema.   - VAS [place] ABI WITH/WO TBI; Future  2. Essential hypertension, benign Continue antihypertensive medications as already ordered, these medications have been reviewed and there are no changes at this time.   3. Atrial fibrillation, unspecified type ([place]) Continue antiarrhythmia medications as already ordered, these medications have been reviewed and there are no changes at  this time.RRR today.   Continue anticoagulation as ordered by Cardiology Service    Current Outpatient Medications on File Prior to Visit  Medication Sig Dispense Refill  . acetaminophen (TYLENOL) 500 MG tablet Take 500-1,000 mg by mouth every 6 (six) hours as needed for mild pain or fever.     . cyanocobalamin (,VITAMIN B-12,) 1000 MCG/ML injection Inject into the muscle.    . furosemide (LASIX) 20 MG tablet Take by mouth.    Marland Kitchen glucosamine-chondroitin 500-400 MG tablet Take 1 tablet by mouth daily.    . metoprolol tartrate (LOPRESSOR) 25 MG tablet Take 75 mg by mouth 2 (two) times daily.    . rivaroxaban (XARELTO) 20 MG TABS tablet Take 20 mg by mouth daily with supper.    Marland Kitchen spironolactone (ALDACTONE) 25 MG tablet Take by mouth.    . telmisartan (MICARDIS) 80 MG tablet Take by mouth.    . chlorthalidone (HYGROTON) 50 MG tablet Take 25 mg by mouth daily as needed (fluid retention).     . fluocinonide (LIDEX) 0.05 % external solution Apply 1 application topically 2 (two) times daily as needed (itching).      No current facility-administered medications on file prior to visit.    There are no Patient Instructions on file for this visit. No follow-ups on file.   Kris Hartmann, NP  This note was completed with Sales executive.  Any errors are purely unintentional.

## 2019-06-30 ENCOUNTER — Other Ambulatory Visit: Payer: Self-pay | Admitting: Nurse Practitioner

## 2019-06-30 DIAGNOSIS — R928 Other abnormal and inconclusive findings on diagnostic imaging of breast: Secondary | ICD-10-CM

## 2019-06-30 DIAGNOSIS — N6320 Unspecified lump in the left breast, unspecified quadrant: Secondary | ICD-10-CM

## 2019-07-02 ENCOUNTER — Ambulatory Visit
Admission: RE | Admit: 2019-07-02 | Discharge: 2019-07-02 | Disposition: A | Discharge status: Home / Self Care | Payer: Medicare Other | Source: Ambulatory Visit | Attending: Nurse Practitioner | Admitting: Nurse Practitioner

## 2019-07-02 DIAGNOSIS — R928 Other abnormal and inconclusive findings on diagnostic imaging of breast: Secondary | ICD-10-CM | POA: Diagnosis present

## 2019-07-02 DIAGNOSIS — N6320 Unspecified lump in the left breast, unspecified quadrant: Secondary | ICD-10-CM | POA: Diagnosis present

## 2019-07-03 LAB — SURGICAL PATHOLOGY

## 2019-07-17 ENCOUNTER — Other Ambulatory Visit: Payer: Self-pay

## 2019-07-17 ENCOUNTER — Observation Stay
Admission: EM | Admit: 2019-07-17 | Discharge: 2019-07-18 | Disposition: A | Discharge status: Home / Self Care | Payer: Medicare Other | Attending: Internal Medicine | Admitting: Internal Medicine

## 2019-07-17 ENCOUNTER — Emergency Department: Payer: Medicare Other

## 2019-07-17 ENCOUNTER — Encounter: Payer: Self-pay | Admitting: Emergency Medicine

## 2019-07-17 DIAGNOSIS — Z20822 Contact with and (suspected) exposure to covid-19: Secondary | ICD-10-CM | POA: Insufficient documentation

## 2019-07-17 DIAGNOSIS — R3915 Urgency of urination: Secondary | ICD-10-CM | POA: Diagnosis not present

## 2019-07-17 DIAGNOSIS — I4891 Unspecified atrial fibrillation: Secondary | ICD-10-CM | POA: Insufficient documentation

## 2019-07-17 DIAGNOSIS — K5730 Diverticulosis of large intestine without perforation or abscess without bleeding: Secondary | ICD-10-CM | POA: Diagnosis not present

## 2019-07-17 DIAGNOSIS — I119 Hypertensive heart disease without heart failure: Secondary | ICD-10-CM | POA: Diagnosis not present

## 2019-07-17 DIAGNOSIS — Z888 Allergy status to other drugs, medicaments and biological substances status: Secondary | ICD-10-CM | POA: Diagnosis not present

## 2019-07-17 DIAGNOSIS — Z8249 Family history of ischemic heart disease and other diseases of the circulatory system: Secondary | ICD-10-CM | POA: Diagnosis not present

## 2019-07-17 DIAGNOSIS — Z7901 Long term (current) use of anticoagulants: Secondary | ICD-10-CM | POA: Insufficient documentation

## 2019-07-17 DIAGNOSIS — Z79899 Other long term (current) drug therapy: Secondary | ICD-10-CM | POA: Diagnosis not present

## 2019-07-17 DIAGNOSIS — Z86718 Personal history of other venous thrombosis and embolism: Secondary | ICD-10-CM | POA: Diagnosis not present

## 2019-07-17 DIAGNOSIS — I739 Peripheral vascular disease, unspecified: Secondary | ICD-10-CM | POA: Insufficient documentation

## 2019-07-17 DIAGNOSIS — K56609 Unspecified intestinal obstruction, unspecified as to partial versus complete obstruction: Secondary | ICD-10-CM | POA: Diagnosis present

## 2019-07-17 DIAGNOSIS — Z9049 Acquired absence of other specified parts of digestive tract: Secondary | ICD-10-CM | POA: Diagnosis not present

## 2019-07-17 LAB — COMPREHENSIVE METABOLIC PANEL
ALT: 14 U/L (ref 0–44)
AST: 17 U/L (ref 15–41)
Albumin: 4.3 g/dL (ref 3.5–5.0)
Alkaline Phosphatase: 77 U/L (ref 38–126)
Anion gap: 10 (ref 5–15)
BUN: 21 mg/dL (ref 8–23)
CO2: 27 mmol/L (ref 22–32)
Calcium: 9.4 mg/dL (ref 8.9–10.3)
Chloride: 98 mmol/L (ref 98–111)
Creatinine, Ser: 0.99 mg/dL (ref 0.44–1.00)
GFR calc Af Amer: >60 mL/min (ref >60)
GFR calc non Af Amer: 52 mL/min — ABNORMAL LOW (ref >60)
Glucose, Bld: 161 mg/dL — ABNORMAL HIGH (ref 70–99)
Potassium: 4.9 mmol/L (ref 3.5–5.1)
Sodium: 135 mmol/L (ref 135–145)
Total Bilirubin: 2 mg/dL — ABNORMAL HIGH (ref 0.3–1.2)
Total Protein: 7.9 g/dL (ref 6.5–8.1)

## 2019-07-17 LAB — CBC
HCT: 49.9 % — ABNORMAL HIGH (ref 36.0–46.0)
Hemoglobin: 17.2 g/dL — ABNORMAL HIGH (ref 12.0–15.0)
MCH: 33.3 pg (ref 26.0–34.0)
MCHC: 34.5 g/dL (ref 30.0–36.0)
MCV: 96.7 fL (ref 80.0–100.0)
Platelets: 137 10*3/uL — ABNORMAL LOW (ref 150–400)
RBC: 5.16 MIL/uL — ABNORMAL HIGH (ref 3.87–5.11)
RDW: 13.1 % (ref 11.5–15.5)
WBC: 8.8 10*3/uL (ref 4.0–10.5)
nRBC: 0 % (ref 0.0–0.2)

## 2019-07-17 LAB — SARS CORONAVIRUS 2 (TAT 6-24 HRS): SARS Coronavirus 2: NEGATIVE

## 2019-07-17 LAB — URINALYSIS, COMPLETE (UACMP) WITH MICROSCOPIC
Bacteria, UA: NONE SEEN
Bilirubin Urine: NEGATIVE
Glucose, UA: 50 mg/dL — AB
Ketones, ur: 5 mg/dL — AB
Nitrite: NEGATIVE
Protein, ur: 100 mg/dL — AB
Specific Gravity, Urine: 1.018 (ref 1.005–1.030)
pH: 6 (ref 5.0–8.0)

## 2019-07-17 LAB — LIPASE, BLOOD: Lipase: 23 U/L (ref 11–51)

## 2019-07-17 LAB — LACTIC ACID, PLASMA: Lactic Acid, Venous: 1.8 mmol/L (ref 0.5–1.9)

## 2019-07-17 MED ORDER — SODIUM CHLORIDE 0.9 % IV SOLN: INTRAVENOUS | Status: DC

## 2019-07-17 MED ORDER — ONDANSETRON HCL 4 MG/2ML IJ SOLN
4.0000 mg | Freq: Once | INTRAMUSCULAR | Status: AC
  Administered 2019-07-17: 4 mg via INTRAVENOUS
  Filled 2019-07-17: qty 2

## 2019-07-17 MED ORDER — ONDANSETRON HCL 4 MG/2ML IJ SOLN
4.0000 mg | Freq: Four times a day (QID) | INTRAMUSCULAR | Status: DC | PRN
  Administered 2019-07-17: 4 mg via INTRAVENOUS
  Filled 2019-07-17: qty 2

## 2019-07-17 MED ORDER — ACETAMINOPHEN 325 MG PO TABS: 650.0000 mg | ORAL_TABLET | Freq: Four times a day (QID) | ORAL | Status: DC | PRN

## 2019-07-17 MED ORDER — SODIUM CHLORIDE 0.9 % IV BOLUS
500.0000 mL | Freq: Once | INTRAVENOUS | Status: AC
  Administered 2019-07-17: 12:00:00 500 mL via INTRAVENOUS

## 2019-07-17 MED ORDER — MORPHINE SULFATE (PF) 2 MG/ML IV SOLN
2.0000 mg | INTRAVENOUS | Status: DC | PRN
  Administered 2019-07-17: 2 mg via INTRAVENOUS
  Filled 2019-07-17: qty 1

## 2019-07-17 MED ORDER — MORPHINE SULFATE (PF) 4 MG/ML IV SOLN
4.0000 mg | Freq: Once | INTRAVENOUS | Status: AC
  Administered 2019-07-17: 4 mg via INTRAVENOUS
  Filled 2019-07-17: qty 1

## 2019-07-17 MED ORDER — METOPROLOL TARTRATE 50 MG PO TABS
75.0000 mg | ORAL_TABLET | Freq: Two times a day (BID) | ORAL | Status: DC
  Administered 2019-07-17 – 2019-07-18 (×2): 75 mg via ORAL
  Filled 2019-07-17 (×2): qty 1

## 2019-07-17 MED ORDER — SODIUM CHLORIDE 0.9 % IV BOLUS
500.0000 mL | Freq: Once | INTRAVENOUS | Status: AC
  Administered 2019-07-17: 500 mL via INTRAVENOUS

## 2019-07-17 MED ORDER — IOHEXOL 9 MG/ML PO SOLN
500.0000 mL | Freq: Two times a day (BID) | ORAL | Status: DC | PRN
  Administered 2019-07-17: 500 mL via ORAL
  Filled 2019-07-17: qty 500

## 2019-07-17 MED ORDER — IRBESARTAN 150 MG PO TABS
300.0000 mg | ORAL_TABLET | Freq: Every day | ORAL | Status: DC
  Administered 2019-07-18: 300 mg via ORAL
  Filled 2019-07-17 (×2): qty 2

## 2019-07-17 MED ORDER — ACETAMINOPHEN 650 MG RE SUPP: 650.0000 mg | Freq: Four times a day (QID) | RECTAL | Status: DC | PRN

## 2019-07-17 MED ORDER — SODIUM CHLORIDE 0.9% FLUSH
3.0000 mL | Freq: Two times a day (BID) | INTRAVENOUS | Status: DC
  Administered 2019-07-17 – 2019-07-18 (×2): 3 mL via INTRAVENOUS

## 2019-07-17 MED ORDER — ONDANSETRON 4 MG PO TBDP: 4.0000 mg | ORAL_TABLET | Freq: Once | ORAL | Status: DC | PRN

## 2019-07-17 MED ORDER — ONDANSETRON HCL 4 MG PO TABS: 4.0000 mg | ORAL_TABLET | Freq: Four times a day (QID) | ORAL | Status: DC | PRN

## 2019-07-17 MED ORDER — IOHEXOL 300 MG/ML  SOLN
100.0000 mL | Freq: Once | INTRAMUSCULAR | Status: AC | PRN
  Administered 2019-07-17: 100 mL via INTRAVENOUS

## 2019-07-17 NOTE — ED Notes (Signed)
Dr. Dahlia Byes at bedside at this time.

## 2019-07-17 NOTE — ED Notes (Signed)
Pt provided with another warm blanket, medications administered for pain per MD order. CT tech at bedside to administer PO contrast. Call bell within reach at this time.

## 2019-07-17 NOTE — H&P (Signed)
History and Physical    PHILADELPHIA RANSFORD [id number] DOB: [birth year] DOA: 07/17/2019  PCP: Sallee Lange, NP   Patient coming from: Home  I have personally briefly reviewed patient's old medical records in [place]  Chief Complaint: Nausea, vomiting and abdominal pain.  HPI: Tiffany Shaffer is a 86 y.o. female with medical history significant of A. fib and prior SBO came to ED with complaint of nausea, vomiting and abdominal pain since last night.  Denies any fever or chills.  Denies any chest pain, shortness of breath.  She is unable to tolerate any p.o. intake.  According to patient she do get these types of episodic abdominal pain lasting for couple of hours but this time it continues throughout the night.  She did had multiple episodes of watery stools overnight.  No bowel movement since this morning. Patient has multiple abdominal surgeries including partial colectomy, hysterectomy and hernia repair. Her abdominal pain resolved with pain medications in ED. She do have some increased urinary urgency, no dysuria or hematuria. Surgery was consulted-who decided for conservative management.  ED Course: Patient was hemodynamically stable, afebrile.  Labs positive for hemoglobin of 17.2 and platelet of 137 which appears chronic, CMP positive for T bili of 2.  Lipase negative, CT abdomen with possible small bowel obstruction.  Review of Systems: As per HPI otherwise 10 point review of systems negative.   Past Medical History:  Diagnosis Date  . Atrial fibrillation ([place])   . Cancer ([place]) 08/29/2016   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  . Headache    behind right eye  . History of blood clots 07/10/2013   Left lower leg  . PVD (peripheral vascular disease) ([place])   . SVT (supraventricular tachycardia) ([place])   . Wears dentures    full upper, p[artial lower    Past Surgical History:  Procedure Laterality Date  . ABDOMINAL HYSTERECTOMY    . angiogram legs    . BREAST  BIOPSY Left    neg  . BREAST BIOPSY Right    neg  . BREAST BIOPSY Left 07/01/2018   ribbon clip, [place] Bx, pending path   . BROW LIFT Bilateral 07/10/2017   Procedure: BLEPHAROPLASTY  UPPER EYELID W EXCESS SKIN;  Surgeon: Karle Starch, MD;  Location: [facility];  Service: Ophthalmology;  Laterality: Bilateral;  . cataracts    . COLON RESECTION    . HERNIA REPAIR    . PTOSIS REPAIR Bilateral 07/10/2017   Procedure: PTOSIS REPAIR RESECT EX;  Surgeon: Karle Starch, MD;  Location: [facility];  Service: Ophthalmology;  Laterality: Bilateral;     reports that she has never smoked. She has never used smokeless tobacco. She reports current alcohol use of about 14.0 standard drinks of alcohol per week. She reports that she does not use drugs.  Allergies  Allergen Reactions  . Pravastatin Other (See Comments)    Muscle aches    Family History  Problem Relation Age of Onset  . CVA Mother   . CAD Father   . Breast cancer Neg Hx     Prior to Admission medications   Medication Sig Start Date End Date Taking? Authorizing Provider  acetaminophen (TYLENOL) 500 MG tablet Take 500-1,000 mg by mouth every 6 (six) hours as needed for mild pain or fever.     [provider]  chlorthalidone (HYGROTON) 50 MG tablet Take 25 mg by mouth daily as needed (fluid retention).     [provider]  cyanocobalamin (,VITAMIN B-12,) 1000 MCG/ML injection Inject into the muscle. 11/26/18   [provider]  fluocinonide (LIDEX) 0.05 % external solution Apply 1 application topically 2 (two) times daily as needed (itching).     [provider]  furosemide (LASIX) 20 MG tablet Take by mouth. 12/24/18 12/24/19  [provider]  glucosamine-chondroitin 500-400 MG tablet Take 1 tablet by mouth daily.    [provider]  metoprolol tartrate (LOPRESSOR) 25 MG tablet Take 75 mg by mouth 2 (two) times daily.    [provider]  rivaroxaban (XARELTO) 20  MG TABS tablet Take 20 mg by mouth daily with supper.    [provider]  spironolactone (ALDACTONE) 25 MG tablet Take by mouth. 10/28/18 10/28/19  [provider]  telmisartan (MICARDIS) 80 MG tablet Take by mouth. 04/10/19 04/09/20  [provider]    Physical Exam: Vitals:   07/17/19 1230 07/17/19 1300 07/17/19 1330 07/17/19 1430  BP: (!) 165/88 (!) 165/91 91/63 (!) 147/84  Pulse: 94  78 97  Resp: (!) 24 (!) 21 (!) 29 17  Temp:      TempSrc:      SpO2: 94%  93% 96%  Weight:      Height:        General: Vital signs reviewed.  Patient is well-developed and well-nourished, in no acute distress and cooperative with exam.  Head: Normocephalic and atraumatic. Eyes: EOMI, conjunctivae normal, no scleral icterus.  ENMT: Mucous membranes are moist.  Neck: Supple, trachea midline, normal ROM, no JVD, masses, thyromegaly, or carotid bruit present.  Cardiovascular: Irregularly irregular, no murmurs, gallops, or rubs. Pulmonary/Chest: Clear to auscultation bilaterally, no wheezes, rales, or rhonchi. Abdominal: Soft, mild diffuse tenderness, non-distended, BS +, no masses, organomegaly, or guarding present.  Extremities: No lower extremity edema bilaterally,  pulses symmetric and intact bilaterally. No cyanosis or clubbing. Neurological: A&O x3, Strength is normal and symmetric bilaterally, cranial nerve II-XII are grossly intact, no focal motor deficit, sensory intact to light touch bilaterally.  Skin: Warm, dry and intact. No rashes or erythema. Psychiatric: Normal mood and affect. speech and behavior is normal. Cognition and memory are normal.   Labs on Admission: I have personally reviewed following labs and imaging studies  CBC: Recent Labs  Lab 07/17/19 1029  WBC 8.8  HGB 17.2*  HCT 49.9*  MCV 96.7  PLT 0000000*   Basic Metabolic Panel: Recent Labs  Lab 07/17/19 1029  NA 135  K 4.9  CL 98  CO2 27  GLUCOSE 161*  BUN 21  CREATININE 0.99  CALCIUM 9.4    GFR: Estimated Creatinine Clearance: 44.5 mL/min (by C-G formula based on SCr of 0.99 mg/dL). Liver Function Tests: Recent Labs  Lab 07/17/19 1029  AST 17  ALT 14  ALKPHOS 77  BILITOT 2.0*  PROT 7.9  ALBUMIN 4.3   Recent Labs  Lab 07/17/19 1029  LIPASE 23   No results for input(s): AMMONIA in the last 168 hours. Coagulation Profile: No results for input(s): INR, PROTIME in the last 168 hours. Cardiac Enzymes: No results for input(s): CKTOTAL, CKMB, CKMBINDEX, TROPONINI in the last 168 hours. BNP (last 3 results) No results for input(s): PROBNP in the last 8760 hours. HbA1C: No results for input(s): HGBA1C in the last 72 hours. CBG: No results for input(s): GLUCAP in the last 168 hours. Lipid Profile: No results for input(s): CHOL, HDL, LDLCALC, TRIG, CHOLHDL, LDLDIRECT in the last 72 hours. Thyroid Function Tests: No results for  input(s): TSH, T4TOTAL, FREET4, T3FREE, THYROIDAB in the last 72 hours. Anemia Panel: No results for input(s): VITAMINB12, FOLATE, FERRITIN, TIBC, IRON, RETICCTPCT in the last 72 hours. Urine analysis:    Component Value Date/Time   COLORURINE YELLOW (A) 07/17/2019 1029   APPEARANCEUR CLEAR (A) 07/17/2019 1029   APPEARANCEUR Hazy 10/20/2011 0720   LABSPEC 1.018 07/17/2019 1029   LABSPEC 1.008 10/20/2011 0720   PHURINE 6.0 07/17/2019 1029   GLUCOSEU 50 (A) 07/17/2019 1029   GLUCOSEU Negative 10/20/2011 0720   HGBUR SMALL (A) 07/17/2019 1029   BILIRUBINUR NEGATIVE 07/17/2019 1029   BILIRUBINUR Negative 10/20/2011 0720   KETONESUR 5 (A) 07/17/2019 1029   PROTEINUR 100 (A) 07/17/2019 1029   NITRITE NEGATIVE 07/17/2019 1029   LEUKOCYTESUR MODERATE (A) 07/17/2019 1029   LEUKOCYTESUR 3+ 10/20/2011 0720    Radiological Exams on Admission: CT ABDOMEN PELVIS W CONTRAST  Result Date: 07/17/2019 CLINICAL DATA:  Vomiting and diarrhea, abdominal pain EXAM: CT ABDOMEN AND PELVIS WITH CONTRAST TECHNIQUE: Multidetector CT imaging of the abdomen  and pelvis was performed using the standard protocol following bolus administration of intravenous contrast. CONTRAST:  134mL OMNIPAQUE IOHEXOL 300 MG/ML  SOLN COMPARISON:  01/10/2019 FINDINGS: Lower chest: No acute abnormality.  Cardiomegaly. Hepatobiliary: No focal liver abnormality is seen. Cholelithiasis. No gallbladder wall thickening or biliary dilatation. Pancreas: Unremarkable. Spleen: Unremarkable. Adrenals/Urinary Tract: Bilateral adrenal nodules likely reflecting adenomas. The kidneys enhance normally. There is no hydronephrosis or calculus identified. Bladder is unremarkable. Stomach/Bowel: Stomach is unremarkable. There is a duodenal diverticulum. There is mild distension of distal small bowel with some fecalization. A transition point to relatively decompressed terminal ileum is present on series 5, image 41. The colon is decompressed with a few scattered diverticula. Fat deposition within the ascending colon wall may reflect chronic inflammation. Anastomoses are evident along the transverse colon. Vascular/Lymphatic: Aortic atherosclerosis. No enlarged abdominal or pelvic lymph nodes. Reproductive: Uterus and bilateral adnexa are unremarkable. Other: No abdominal wall hernia or abnormality. No abdominopelvic ascites. Musculoskeletal: Degenerative changes of the spine. No acute finding. IMPRESSION: Mild distension of distal small bowel with some fecalization of contents at the transition point to relatively decompressed terminal ileum. This likely reflects slow transit or low-grade obstruction. Additional stable findings detailed above. Electronically Signed   By: Macy Mis M.D.   On: 07/17/2019 14:06    EKG: Independently reviewed.  Atrial fibrillation with heart rate of 106, right bundle branch block with T wave inversion in inferolateral leads.  Assessment/Plan Active Problems:   SBO (small bowel obstruction) ([facility])   Small bowel obstruction.  Surgery was consulted from ED-they will try  conservative management. Patient wants to avoid NG tube if possible, currently being managed with Zofran if persistent vomiting or wrenching we will place NG tube. -Normal saline at 100 mL/h. -N.p.o.  A. Fib.  We will continue home dose of metoprolol. -Hold Xarelto till cleared from SBO for a possible intervention if needed.  Hypertension.  Continue home dose of irbesartan.  DVT prophylaxis: SCDs Code Status: Full code Family Communication: No family at bedside Disposition Plan: Pending improvement Consults called: Surgery Admission status: Observation   Lorella Nimrod MD [facility] Pager [phone]  If 7PM-7AM, please contact night-coverage [email or web address] Password [facility]  07/17/2019, 5:17 PM   This record has been created using Dragon voice recognition software. Errors have been sought and corrected,but may not always be located. Such creation errors do not reflect on the standard of care.

## 2019-07-17 NOTE — ED Notes (Signed)
Cardiac rhythm strip printed and reviewed by ed md Archie Balboa compared to earlier EKG prior to transport. Pt denies any pain at this time.

## 2019-07-17 NOTE — ED Triage Notes (Signed)
Patient presents to the ED with vomiting, diarrhea and abdominal pain that started today.  Patient's symptoms began today and are similar to when she has had intestinal blockages in the past.  Patient appears uncomfortable at this time.

## 2019-07-17 NOTE — ED Provider Notes (Signed)
[facility] Emergency Department Provider Note ____________________________________________   First MD Initiated Contact with Patient 07/17/19 1158     (approximate)  I have reviewed the triage vital signs and the nursing notes.   HISTORY  Chief Complaint Abdominal Pain, Emesis, and Diarrhea    HPI Tiffany Shaffer is a 86 y.o. female with PMH as noted below including history of SBO in the past who presents with abdominal pain, mainly in the upper abdomen and periumbilical area, acute onset last night, and associated with vomiting since early this morning and loose watery stool.  The pain has been constant.  She states it feels a bit different than when she had an SBO last year.  She denies any fever, chest pain, or urinary symptoms.  Past Medical History:  Diagnosis Date  . Atrial fibrillation ([place])   . Cancer ([place]) 08/29/2016   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  . Headache    behind right eye  . History of blood clots 07/10/2013   Left lower leg  . PVD (peripheral vascular disease) ([place])   . SVT (supraventricular tachycardia) ([place])   . Wears dentures    full upper, p[artial lower    Patient Active Problem List   Diagnosis Date Noted  . SBO (small bowel obstruction) ([place]) 09/13/2018  . Essential hypertension, benign 06/23/2016  . Atrial fibrillation ([place]) 06/23/2016  . PVD (peripheral vascular disease) ([place]) 06/23/2016    Past Surgical History:  Procedure Laterality Date  . ABDOMINAL HYSTERECTOMY    . angiogram legs    . BREAST BIOPSY Left    neg  . BREAST BIOPSY Right    neg  . BREAST BIOPSY Left 07/01/2018   ribbon clip, [place] Bx, pending path   . BROW LIFT Bilateral 07/10/2017   Procedure: BLEPHAROPLASTY  UPPER EYELID W EXCESS SKIN;  Surgeon: Karle Starch, MD;  Location: [place];  Service: Ophthalmology;  Laterality: Bilateral;  . cataracts    . COLON RESECTION    . HERNIA REPAIR    . PTOSIS REPAIR Bilateral 07/10/2017   Procedure: PTOSIS REPAIR RESECT EX;  Surgeon: Karle Starch, MD;  Location: [place];  Service: Ophthalmology;  Laterality: Bilateral;    Prior to Admission medications   Medication Sig Start Date End Date Taking? Authorizing Provider  acetaminophen (TYLENOL) 500 MG tablet Take 500-1,000 mg by mouth every 6 (six) hours as needed for mild pain or fever.     [provider]  chlorthalidone (HYGROTON) 50 MG tablet Take 25 mg by mouth daily as needed (fluid retention).     [provider]  cyanocobalamin (,VITAMIN B-12,) 1000 MCG/ML injection Inject into the muscle. 11/26/18   [provider]  fluocinonide (LIDEX) 0.05 % external solution Apply 1 application topically 2 (two) times daily as needed (itching).     [provider]  furosemide (LASIX) 20 MG tablet Take by mouth. 12/24/18 12/24/19  [provider]  glucosamine-chondroitin 500-400 MG tablet Take 1 tablet by mouth daily.    [provider]  metoprolol tartrate (LOPRESSOR) 25 MG tablet Take 75 mg by mouth 2 (two) times daily.    [provider]  rivaroxaban (XARELTO) 20 MG TABS tablet Take 20 mg by mouth daily with supper.    [provider]  spironolactone (ALDACTONE) 25 MG tablet Take by mouth. 10/28/18 10/28/19  [provider]  telmisartan (MICARDIS) 80 MG tablet Take by mouth. 04/10/19 04/09/20  [provider]  Allergies Pravastatin  Family History  Problem Relation Age of Onset  . CVA Mother   . CAD Father   . Breast cancer Neg Hx     Social History Social History   Tobacco Use  . Smoking status: Never Smoker  . Smokeless tobacco: Never Used  Substance Use Topics  . Alcohol use: Yes    Alcohol/week: 14.0 standard drinks    Types: 14 Shots of liquor per week  . Drug use: No    Review of Systems  Constitutional: No fever. Eyes: No redness. ENT: No sore throat. Cardiovascular: Denies chest pain. Respiratory: Denies  shortness of breath. Gastrointestinal: Positive for vomiting and diarrhea. Genitourinary: Negative for dysuria.  Musculoskeletal: Negative for back pain. Skin: Negative for rash. Neurological: Negative for headache.   ____________________________________________   PHYSICAL EXAM:  VITAL SIGNS: ED Triage Vitals  Enc Vitals Group     BP 07/17/19 1027 (!) 186/112     Pulse Rate 07/17/19 1027 (!) 107     Resp 07/17/19 1027 16     Temp 07/17/19 1027 98.6 F (37 C)     Temp Source 07/17/19 1027 Oral     SpO2 07/17/19 1027 93 %     Weight 07/17/19 1030 185 lb (83.9 kg)     Height 07/17/19 1030 5\' 5"  (1.651 m)     Head Circumference --      Peak Flow --      Pain Score 07/17/19 1213 8     Pain Loc --      Pain Edu? --      Excl. in [place]? --     Constitutional: Alert and oriented.  Uncomfortable appearing but in no acute distress. Eyes: Conjunctivae are normal.  No scleral icterus. Head: Atraumatic. Nose: No congestion/rhinnorhea. Mouth/Throat: Mucous membranes are slightly dry.   Neck: Normal range of motion.  Cardiovascular: Normal rate, regular rhythm.  Good peripheral circulation. Respiratory: Normal respiratory effort.  No retractions.  Gastrointestinal: Soft with moderate epigastric tenderness.  No significant distention.  Genitourinary: No flank tenderness. Musculoskeletal:  Extremities warm and well perfused.  Neurologic:  Normal speech and language. No gross focal neurologic deficits are appreciated.  Skin:  Skin is warm and dry. No rash noted. Psychiatric: Mood and affect are normal. Speech and behavior are normal.  ____________________________________________   LABS (all labs ordered are listed, but only abnormal results are displayed)  Labs Reviewed  COMPREHENSIVE METABOLIC PANEL - Abnormal; Notable for the following components:      Result Value   Glucose, Bld 161 (*)    Total Bilirubin 2.0 (*)    GFR calc non Af Amer 52 (*)    All other components within  normal limits  CBC - Abnormal; Notable for the following components:   RBC 5.16 (*)    Hemoglobin 17.2 (*)    HCT 49.9 (*)    Platelets 137 (*)    All other components within normal limits  URINALYSIS, COMPLETE (UACMP) WITH MICROSCOPIC - Abnormal; Notable for the following components:   Color, Urine YELLOW (*)    APPearance CLEAR (*)    Glucose, UA 50 (*)    Hgb urine dipstick SMALL (*)    Ketones, ur 5 (*)    Protein, ur 100 (*)    Leukocytes,Ua MODERATE (*)    All other components within normal limits  SARS CORONAVIRUS 2 (TAT 6-24 HRS)  URINE CULTURE  LIPASE, BLOOD  LACTIC ACID, PLASMA  LACTIC ACID, PLASMA   ____________________________________________  EKG  ED ECG REPORT I, Arta Silence, the attending physician, personally viewed and interpreted this ECG.  Date: 07/17/2019 EKG Time: 1036 Rate: 106 Rhythm: Atrial fibrillation QRS Axis: normal Intervals: normal ST/T Wave abnormalities: Nonspecific inferolateral ST abnormalities Narrative Interpretation: Nonspecific abnormalities with no evidence of acute ischemia  ____________________________________________  RADIOLOGY  CT abdomen: Low-grade small bowel obstruction  ____________________________________________   PROCEDURES  Procedure(s) performed: No  Procedures  Critical Care performed: No ____________________________________________   INITIAL IMPRESSION / ASSESSMENT AND PLAN / ED COURSE  Pertinent labs & imaging results that were available during my care of the patient were reviewed by me and considered in my medical decision making (see chart for details).  86 year old female with PMH as noted above presents with upper abdominal pain since last night associated with vomiting since this morning, as well as some loose stools.  I reviewed the past medical records in epic.  The patient was admitted in March of last year with an SBO that was treated nonoperatively.  On exam, the patient is  uncomfortable but not acutely ill-appearing.  Her vital signs are normal except for hypertension and borderline tachycardia.  The abdomen is soft with some epigastric tenderness but no peritoneal signs.  The remainder of the exam is as described above.  Overall presentation is concerning for SBO, differential also includes volvulus, gastroenteritis, colitis, diverticulitis, or less likely hepatobiliary cause.  We will obtain a lab work-up, CT abdomen, and reassess.  ----------------------------------------- 2:47 PM on 07/17/2019 -----------------------------------------  CT shows findings suggestive of a low-grade small bowel obstruction.  On reassessment, the patient appears much more comfortable and has had no further vomiting after receiving Zofran and morphine earlier.  Given the mostly resolved symptoms, I will hold off on an NG tube for now.  If the patient has recurrent vomiting or other significant recurrent symptoms, we will place the NG.  I consulted Dr. Dahlia Byes from general surgery who will come to see the patient.  I discussed the case with the hospitalist for admission.  __________________________  Tiffany Shaffer was evaluated in Emergency Department on 07/17/2019 for the symptoms described in the history of present illness. She was evaluated in the context of the global COVID-19 pandemic, which necessitated consideration that the patient might be at risk for infection with the SARS-CoV-2 virus that causes COVID-19. Institutional protocols and algorithms that pertain to the evaluation of patients at risk for COVID-19 are in a state of rapid change based on information released by regulatory bodies including the CDC and federal and state organizations. These policies and algorithms were followed during the patient's care in the ED. ____________________________________________   FINAL CLINICAL IMPRESSION(S) / ED DIAGNOSES  Final diagnoses:  Small bowel obstruction ([place])      NEW  MEDICATIONS STARTED DURING THIS VISIT:  New Prescriptions   No medications on file     Note:  This document was prepared using Dragon voice recognition software and may include unintentional dictation errors.    Arta Silence, MD 07/17/19 [phone]

## 2019-07-17 NOTE — Consult Note (Signed)
Patient ID: Tiffany Shaffer, female   DOB: 20-Jun-1932, 86 y.o.   MRN: [id number]  HPI Tiffany Shaffer is a 86 y.o. female w hx of a fib on xarelto prior SBO a year ago that resolved w medical rx. Now comes w 1 day hx of N/V and diarrhea as well as abd pain. Pain colicky type moderate and intermittent. Diffuse in nature. No specific alleviating or aggravating factors. CT scan pers. Reviewed showing dilated sb w decompressed distal TI. No free air or pneumatosis. Prior hx sigmoid colectomy. She is able to perform more than 4 METS w/o angina or dyspnea. Plays golf , lives alone, still drives and is very independent. Her mind is pristine.hb 17 PL 137 wbc 8.8. Rest cmp is nml except TB 2.0   HPI  Past Medical History:  Diagnosis Date  . Atrial fibrillation ([place])   . Cancer ([place]) 08/29/2016   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  . Headache    behind right eye  . History of blood clots 07/10/2013   Left lower leg  . PVD (peripheral vascular disease) ([place])   . SVT (supraventricular tachycardia) ([place])   . Wears dentures    full upper, p[artial lower    Past Surgical History:  Procedure Laterality Date  . ABDOMINAL HYSTERECTOMY    . angiogram legs    . BREAST BIOPSY Left    neg  . BREAST BIOPSY Right    neg  . BREAST BIOPSY Left 07/01/2018   ribbon clip, [place] Bx, pending path   . BROW LIFT Bilateral 07/10/2017   Procedure: BLEPHAROPLASTY  UPPER EYELID W EXCESS SKIN;  Surgeon: Karle Starch, MD;  Location: [place];  Service: Ophthalmology;  Laterality: Bilateral;  . cataracts    . COLON RESECTION    . HERNIA REPAIR    . PTOSIS REPAIR Bilateral 07/10/2017   Procedure: PTOSIS REPAIR RESECT EX;  Surgeon: Karle Starch, MD;  Location: [place];  Service: Ophthalmology;  Laterality: Bilateral;    Family History  Problem Relation Age of Onset  . CVA Mother   . CAD Father   . Breast cancer Neg Hx     Social History Social History   Tobacco Use  . Smoking status:  Never Smoker  . Smokeless tobacco: Never Used  Substance Use Topics  . Alcohol use: Yes    Alcohol/week: 14.0 standard drinks    Types: 14 Shots of liquor per week  . Drug use: No    Allergies  Allergen Reactions  . Pravastatin Other (See Comments)    Muscle aches    Current Facility-Administered Medications  Medication Dose Route Frequency Provider Last Rate Last Admin  . iohexol (OMNIPAQUE) 9 MG/ML oral solution 500 mL  500 mL Oral BID PRN Arta Silence, MD   500 mL at 07/17/19 1216  . ondansetron (ZOFRAN-ODT) disintegrating tablet 4 mg  4 mg Oral Once PRN Arta Silence, MD       Current Outpatient Medications  Medication Sig Dispense Refill  . acetaminophen (TYLENOL) 500 MG tablet Take 500-1,000 mg by mouth every 6 (six) hours as needed for mild pain or fever.     . chlorthalidone (HYGROTON) 50 MG tablet Take 25 mg by mouth daily as needed (fluid retention).     . cyanocobalamin (,VITAMIN B-12,) 1000 MCG/ML injection Inject into the muscle.    . fluocinonide (LIDEX) 0.05 % external solution Apply 1 application topically 2 (two) times daily as needed (itching).     Marland Kitchen  furosemide (LASIX) 20 MG tablet Take by mouth.    Marland Kitchen glucosamine-chondroitin 500-400 MG tablet Take 1 tablet by mouth daily.    . metoprolol tartrate (LOPRESSOR) 25 MG tablet Take 75 mg by mouth 2 (two) times daily.    . rivaroxaban (XARELTO) 20 MG TABS tablet Take 20 mg by mouth daily with supper.    Marland Kitchen spironolactone (ALDACTONE) 25 MG tablet Take by mouth.    . telmisartan (MICARDIS) 80 MG tablet Take by mouth.       Review of Systems Full ROS  was asked and was negative except for the information on the HPI  Physical Exam Blood pressure (!) 147/84, pulse 97, temperature 98.6 F (37 C), temperature source Oral, resp. rate 17, height 5\' 5"  (1.651 m), weight 83.9 kg, SpO2 96 %. CONSTITUTIONAL:NAD EYES: Pupils are equal, round, and reactive to light, Sclera are non-icteric. EARS, NOSE, MOUTH AND  THROAT: wearing a mask  Hearing is intact to voice. LYMPH NODES:  Lymph nodes in the neck are normal. RESPIRATORY:  Lungs are clear. There is normal respiratory effort, with equal breath sounds bilaterally, and without pathologic use of accessory muscles. CARDIOVASCULAR: Heart is regular without murmurs, gallops, or rubs. GI: The abdomen is  soft, nontender, and nondistended. There are no palpable masses. There is no hepatosplenomegaly. There are normal bowel sounds in all quadrants. GU: Rectal deferred.   MUSCULOSKELETAL: Normal muscle strength and tone. No cyanosis or edema.   SKIN: Turgor is good and there are no pathologic skin lesions or ulcers. NEUROLOGIC: Motor and sensation is grossly normal. Cranial nerves are grossly intact. PSYCH:  Oriented to person, place and time. Affect is normal.  Data Reviewed  I have personally reviewed the patient's imaging, laboratory findings and medical records.    Assessment/Plan 86 yo female w recurrent partial SBO vs enteritis. Benign abdomen and no need for emergent surgical intervention. I will give her an extra bolus of crystalloid, serial abd exams and repeat kub in am. Rest of medical issues per Hospitalist. May need NGT if she starts vomiting.    Caroleen Hamman, MD FACS General Surgeon 07/17/2019, 3:24 PM

## 2019-07-18 ENCOUNTER — Observation Stay: Payer: Medicare Other

## 2019-07-18 DIAGNOSIS — K56609 Unspecified intestinal obstruction, unspecified as to partial versus complete obstruction: Secondary | ICD-10-CM | POA: Diagnosis not present

## 2019-07-18 LAB — COMPREHENSIVE METABOLIC PANEL
ALT: 10 U/L (ref 0–44)
AST: 14 U/L — ABNORMAL LOW (ref 15–41)
Albumin: 3.1 g/dL — ABNORMAL LOW (ref 3.5–5.0)
Alkaline Phosphatase: 50 U/L (ref 38–126)
Anion gap: 7 (ref 5–15)
BUN: 19 mg/dL (ref 8–23)
CO2: 27 mmol/L (ref 22–32)
Calcium: 8.1 mg/dL — ABNORMAL LOW (ref 8.9–10.3)
Chloride: 102 mmol/L (ref 98–111)
Creatinine, Ser: 0.93 mg/dL (ref 0.44–1.00)
GFR calc Af Amer: >60 mL/min (ref >60)
GFR calc non Af Amer: 56 mL/min — ABNORMAL LOW (ref >60)
Glucose, Bld: 103 mg/dL — ABNORMAL HIGH (ref 70–99)
Potassium: 4.2 mmol/L (ref 3.5–5.1)
Sodium: 136 mmol/L (ref 135–145)
Total Bilirubin: 1.9 mg/dL — ABNORMAL HIGH (ref 0.3–1.2)
Total Protein: 5.7 g/dL — ABNORMAL LOW (ref 6.5–8.1)

## 2019-07-18 LAB — CBC
HCT: 38.1 % (ref 36.0–46.0)
Hemoglobin: 13.1 g/dL (ref 12.0–15.0)
MCH: 33.7 pg (ref 26.0–34.0)
MCHC: 34.4 g/dL (ref 30.0–36.0)
MCV: 97.9 fL (ref 80.0–100.0)
Platelets: 115 10*3/uL — ABNORMAL LOW (ref 150–400)
RBC: 3.89 MIL/uL (ref 3.87–5.11)
RDW: 13.4 % (ref 11.5–15.5)
WBC: 9.2 10*3/uL (ref 4.0–10.5)
nRBC: 0 % (ref 0.0–0.2)

## 2019-07-18 MED ORDER — ONDANSETRON HCL 4 MG PO TABS: 4.0000 mg | ORAL_TABLET | Freq: Four times a day (QID) | ORAL | 0 refills | Status: DC | PRN

## 2019-07-18 NOTE — Discharge Summary (Signed)
Physician Discharge Summary  Tiffany Shaffer IOM:[id number] DOB: 02/18/1932 DOA: 07/17/2019  PCP: Sallee Lange, NP  Admit date: 07/17/2019 Discharge date: 07/18/2019  Admitted From: Home Disposition: Home  Recommendations for Outpatient Follow-up:  1. Follow up with PCP in 1-2 weeks 2. Please obtain BMP/CBC in one week 3. Please follow up on the following pending results: None  Home Health: No Equipment/Devices: None Discharge Condition: Stable CODE STATUS: Full Diet recommendation: Heart Healthy    Brief/Interim Summary: [name] is a 86 y.o. female with medical history significant of A. fib and prior SBO came to ED with complaint of nausea, vomiting and abdominal pain since last night.  Denies any fever or chills.  Denies any chest pain, shortness of breath.  She is unable to tolerate any p.o. intake.  According to patient she do get these types of episodic abdominal pain lasting for couple of hours but this time it continues throughout the night.  She did had multiple episodes of watery stools overnight. CT abdomen with possible small bowel obstruction. Surgery was consulted and they recommend conservative management. Her nausea and abdominal pain responded well to Zofran and morphine. She never required any NG tube. Her symptoms resolved by next morning. She was able to advancing diet well. Gastrografin studies done in the morning shows contrast in colon and no obstruction.  She will continue rest of her home medications and follow-up with primary care physician.  Discharge Diagnoses:  Active Problems:   SBO (small bowel obstruction) Tiffany Shaffer)  Discharge Instructions  Discharge Instructions    Diet - low sodium heart healthy   Complete by: As directed    Discharge instructions   Complete by: As directed    It was pleasure taking care of you. I'm glad you're doing well, please advance your diet slowly. I'm giving you some pills for nausea in case you needed.    Increase activity slowly   Complete by: As directed      Allergies as of 07/18/2019      Reactions   Pravastatin Other (See Comments)   Muscle aches      Medication List    TAKE these medications   acetaminophen 500 MG tablet Commonly known as: TYLENOL Take 500-1,000 mg by mouth every 6 (six) hours as needed for mild pain or fever.   chlorthalidone 50 MG tablet Commonly known as: HYGROTON Take 25 mg by mouth daily as needed (fluid retention).   cyanocobalamin 1000 MCG/ML injection Commonly known as: (VITAMIN B-12) Inject into the muscle.   fluocinonide 0.05 % external solution Commonly known as: LIDEX Apply 1 application topically 2 (two) times daily as needed (itching).   furosemide 20 MG tablet Commonly known as: LASIX Take 20 mg by mouth daily.   glucosamine-chondroitin 500-400 MG tablet Take 1 tablet by mouth daily.   metoprolol tartrate 25 MG tablet Commonly known as: LOPRESSOR Take 75 mg by mouth 2 (two) times daily.   ondansetron 4 MG tablet Commonly known as: ZOFRAN Take 1 tablet (4 mg total) by mouth every 6 (six) hours as needed for nausea.   rivaroxaban 20 MG Tabs tablet Commonly known as: XARELTO Take 20 mg by mouth daily with supper.   spironolactone 25 MG tablet Commonly known as: ALDACTONE Take 25 mg by mouth daily.   telmisartan 80 MG tablet Commonly known as: MICARDIS Take 80 mg by mouth daily.       Allergies  Allergen Reactions  . Pravastatin Other (See Comments)  Muscle aches    Consultations:  General surgery  Procedures/Studies: CT ABDOMEN PELVIS W CONTRAST  Result Date: 07/17/2019 CLINICAL DATA:  Vomiting and diarrhea, abdominal pain EXAM: CT ABDOMEN AND PELVIS WITH CONTRAST TECHNIQUE: Multidetector CT imaging of the abdomen and pelvis was performed using the standard protocol following bolus administration of intravenous contrast. CONTRAST:  180m OMNIPAQUE IOHEXOL 300 MG/ML  SOLN COMPARISON:  01/10/2019 FINDINGS: Lower  chest: No acute abnormality.  Cardiomegaly. Hepatobiliary: No focal liver abnormality is seen. Cholelithiasis. No gallbladder wall thickening or biliary dilatation. Pancreas: Unremarkable. Spleen: Unremarkable. Adrenals/Urinary Tract: Bilateral adrenal nodules likely reflecting adenomas. The kidneys enhance normally. There is no hydronephrosis or calculus identified. Bladder is unremarkable. Stomach/Bowel: Stomach is unremarkable. There is a duodenal diverticulum. There is mild distension of distal small bowel with some fecalization. A transition point to relatively decompressed terminal ileum is present on series 5, image 41. The colon is decompressed with a few scattered diverticula. Fat deposition within the ascending colon wall may reflect chronic inflammation. Anastomoses are evident along the transverse colon. Vascular/Lymphatic: Aortic atherosclerosis. No enlarged abdominal or pelvic lymph nodes. Reproductive: Uterus and bilateral adnexa are unremarkable. Other: No abdominal wall hernia or abnormality. No abdominopelvic ascites. Musculoskeletal: Degenerative changes of the spine. No acute finding. IMPRESSION: Mild distension of distal small bowel with some fecalization of contents at the transition point to relatively decompressed terminal ileum. This likely reflects slow transit or low-grade obstruction. Additional stable findings detailed above. Electronically Signed   By: PMacy MisM.D.   On: 07/17/2019 14:06   DG ABD ACUTE 2+V W 1V CHEST  Result Date: 07/18/2019 CLINICAL DATA:  Small bowel obstruction. EXAM: DG ABDOMEN ACUTE W/ 1V CHEST COMPARISON:  CT of the abdomen and pelvis 07/17/2019 FINDINGS: Heart is enlarged. Atherosclerotic changes are noted at the aortic arch. There is no edema or effusion. No focal airspace disease is present. Oral contrast is evident throughout a nondistended colon. Small bowel is within limits. Diverticular changes again noted in the sigmoid colon. Degenerative  changes are noted in the axial skeleton. IMPRESSION: 1. No evidence for obstruction. 2. Oral contrast is evident throughout a nondistended colon consistent with transit of bowel contents. 3. Sigmoid diverticulosis. 4. Cardiomegaly without failure. 5. No acute cardiopulmonary disease. Electronically Signed   By: CSan MorelleM.D.   On: 07/18/2019 07:19   [place] LTD UNI LEFT INC AXILLA  Result Date: 06/25/2019 CLINICAL DATA:  Six-month follow-up of a left breast mass at 4 o'clock, 10 cm from the nipple EXAM: ULTRASOUND OF THE LEFT BREAST COMPARISON:  Previous exam(s). FINDINGS: On physical exam, no suspicious lumps are identified. Targeted ultrasound is performed, showing a mass in the left breast at 4 o'clock, 10 cm from the nipple measuring 6 x 5 x 6 mm today versus 6 x 5 x 7 mm previously. The mass looks less cystic today. There are only a few small obviously cystic components. The mass contains calcifications and demonstrates increased through transmission. No axillary adenopathy. IMPRESSION: The mass in the left breast has not grown. While the mass could be apocrine metaplasia or a complicated cyst, a mass with solid and cystic components is not excluded. RECOMMENDATION: Recommend ultrasound-guided biopsy of the left breast mass at 4 o'clock. I have discussed the findings and recommendations with the patient. If applicable, a reminder letter will be sent to the patient regarding the next appointment. BI-RADS CATEGORY  4: Suspicious. Electronically Signed   By: DDorise BullionIII M.D   On: 06/25/2019 15:20  VAS [place] ABI WITH/WO TBI  Result Date: 07/01/2019 LOWER EXTREMITY DOPPLER STUDY Indications: Peripheral artery disease.  Comparison Study: 06/21/2018 Performing Technologist: Almira Coaster RVS  Examination Guidelines: A complete evaluation includes at minimum, Doppler waveform signals and systolic blood pressure reading at the level of bilateral brachial, anterior tibial, and posterior  tibial arteries, when vessel segments are accessible. Bilateral testing is considered an integral part of a complete examination. Photoelectric Plethysmograph (PPG) waveforms and toe systolic pressure readings are included as required and additional duplex testing as needed. Limited examinations for reoccurring indications may be performed as noted.  ABI Findings: +--------+------------------+-----+----------+--------+ Right   Rt Pressure (mmHg)IndexWaveform  Comment  +--------+------------------+-----+----------+--------+ Brachial170                                       +--------+------------------+-----+----------+--------+ ATA     250               1.45 monophasicNC       +--------+------------------+-----+----------+--------+ PTA     142               0.83 monophasic         +--------+------------------+-----+----------+--------+ +--------+------------------+-----+--------+-------+ Left    Lt Pressure (mmHg)IndexWaveformComment +--------+------------------+-----+--------+-------+ Brachial172                                    +--------+------------------+-----+--------+-------+ ATA     170               0.99 biphasic        +--------+------------------+-----+--------+-------+ PTA     0                 0.00 absent          +--------+------------------+-----+--------+-------+ PERO    177               1.03 biphasic        +--------+------------------+-----+--------+-------+ +-------+-----------+-----------+------------+------------+ ABI/TBIToday's ABIToday's TBIPrevious ABIPrevious TBI +-------+-----------+-----------+------------+------------+ Right  >1.0 [place]    .86        1.34        .69          +-------+-----------+-----------+------------+------------+ Left   1.03       .86        1.01        .69          +-------+-----------+-----------+------------+------------+ Bilateral ABIs appear essentially unchanged compared to prior study on  06/21/2018. Bilateral TBIs appear increased compared to prior study on 06/21/2018.  Summary: Right: Resting right ankle-brachial index indicates noncompressible right lower extremity arteries. The right toe-brachial index is normal. Imaging performed on the Right PTA to verify flow; flow is Monophasic Low velocity. Left: Resting left ankle-brachial index is within normal range. No evidence of significant left lower extremity arterial disease. The left toe-brachial index is normal. Imaging performed of the Left PTA,ATA and Peroneal Artery; No flow seen in the Left PTA.  *See table(s) above for measurements and observations.  Electronically signed by Leotis Pain MD on 07/01/2019 at 9:11:13 AM.    Final    MM CLIP PLACEMENT LEFT  Result Date: 07/02/2019 CLINICAL DATA:  Status post ultrasound-guided core biopsy of a left breast mass. EXAM: DIAGNOSTIC LEFT MAMMOGRAM POST ULTRASOUND BIOPSY COMPARISON:  Previous exam(s). FINDINGS: Mammographic images were obtained following ultrasound guided biopsy of a mass in the  4 o'clock region the left breast. The ribbon shaped marker clip is in the lower outer quadrant of the breast in appropriate position. IMPRESSION: Appropriate positioning of the ribbon shaped biopsy marking clip at the site of biopsy in the lower outer quadrant of the left breast. Final Assessment: Post Procedure Mammograms for Marker Placement Electronically Signed   By: Lillia Mountain M.D.   On: 07/02/2019 12:31   [place] LT BREAST BX W LOC DEV 1ST LESION IMG BX SPEC US GUIDE  Addendum Date: 07/14/2019   ADDENDUM REPORT: 07/03/2019 11:37 ADDENDUM: PATHOLOGY revealed: A. BREAST, LEFT 4:00 10 CM FN; ULTRASOUND-GUIDED BIOPSY: - BENIGN SIMPLE MAMMARY CYST WITH MILDLY INFLAMED FIBROUS STROMA. - COLUMNAR CELL CHANGE WITH FOCAL USUAL DUCTAL HYPERPLASIA. - NEGATIVE FOR ATYPIA AND MALIGNANCY. Pathology results are CONCORDANT with imaging findings, per Dr. Lillia Mountain. Pathology results and recommendations were discussed with  patient by telephone on 07/03/2019. Patient reported biopsy site doing well with slight tenderness at the site. Post biopsy care instructions were reviewed and questions were answered. Patient was instructed to call [facility] if any concerns or questions arise related to the biopsy. Recommendation:  Return to annual bilateral screening mammogram. Addendum by Electa Sniff RN on 07/03/2019. Electronically Signed   By: Lillia Mountain M.D.   On: 07/03/2019 11:37   Result Date: 07/14/2019 CLINICAL DATA:  Left breast mass. EXAM: ULTRASOUND GUIDED LEFT BREAST CORE NEEDLE BIOPSY COMPARISON:  Previous exam(s). FINDINGS: I met with the patient and we discussed the procedure of ultrasound-guided biopsy, including benefits and alternatives. We discussed the high likelihood of a successful procedure. We discussed the risks of the procedure, including infection, bleeding, tissue injury, clip migration, and inadequate sampling. Informed written consent was given. The usual time-out protocol was performed immediately prior to the procedure. Lesion quadrant: Lower outer quadrant Using sterile technique and 1% lidocaine and 1% lidocaine with epinephrine as local anesthetic, under direct ultrasound visualization, a 12 gauge spring-loaded device was used to perform biopsy of a mass in the 4 o'clock region the left breast using a lateral to medial approach. At the conclusion of the procedure ribbon shaped tissue marker clip was deployed into the biopsy cavity. Follow up 2 view mammogram was performed and dictated separately. IMPRESSION: Ultrasound guided biopsy of the left breast. No apparent complications. Electronically Signed: By: Lillia Mountain M.D. On: 07/02/2019 12:28    Subjective: Patient was feeling better when seen this morning. Denies any more nausea or vomiting. She was passing flatus and had 1 bowel movement. She was tolerating diet well.  Discharge Exam: Vitals:   07/18/19 0947 07/18/19 1142  BP: 118/65 110/61   Pulse: 69 80  Resp: 18 18  Temp: 97.8 F (36.6 C) 98.3 F (36.8 C)  SpO2: 95% 97%   Vitals:   07/17/19 2136 07/18/19 0506 07/18/19 0947 07/18/19 1142  BP: (!) 122/44 (!) 100/55 118/65 110/61  Pulse: 85 75 69 80  Resp: _0 Temp: 98.5 F (36.9 C) 97.9 F (36.6 C) 97.8 F (36.6 C) 98.3 F (36.8 C)  TempSrc: Oral Oral Oral Oral  SpO2: 97% 94% 95% 97%  Weight: 84.3 kg     Height: 5' 5" (1.651 m)       General: Pt is alert, awake, not in acute distress Cardiovascular: RRR, S1/S2 +, no rubs, no gallops Respiratory: CTA bilaterally, no wheezing, no rhonchi Abdominal: Soft, NT, ND, bowel sounds + Extremities: no edema, no cyanosis   The results of significant diagnostics  from this hospitalization (including imaging, microbiology, ancillary and laboratory) are listed below for reference.    Microbiology: Recent Results (from the past 240 hour(s))  SARS CORONAVIRUS 2 (TAT 6-24 HRS) Nasopharyngeal Nasopharyngeal Swab     Status: None   Collection Time: 07/17/19  2:36 PM   Specimen: Nasopharyngeal Swab  Result Value Ref Range Status   SARS Coronavirus 2 NEGATIVE NEGATIVE Final    Comment: (NOTE) SARS-CoV-2 target nucleic acids are NOT DETECTED. The SARS-CoV-2 RNA is generally detectable in upper and lower respiratory specimens during the acute phase of infection. Negative results do not preclude SARS-CoV-2 infection, do not rule out co-infections with other pathogens, and should not be used as the sole basis for treatment or other patient management decisions. Negative results must be combined with clinical observations, patient history, and epidemiological information. The expected result is Negative. Fact Sheet for Patients: SugarRoll.be Fact Sheet for Healthcare Providers: https://www.woods-mathews.com/ This test is not yet approved or cleared by the [place] FDA and  has been authorized for detection and/or diagnosis  of SARS-CoV-2 by FDA under an Emergency Use Authorization (EUA). This EUA will remain  in effect (meaning this test can be used) for the duration of the COVID-19 declaration under Section 56 4(b)(1) of the Act, 21 U.S.C. section 360bbb-3(b)(1), unless the authorization is terminated or revoked sooner. Performed at [facility], [place] [address], [place], [place] [address]      Labs: BNP (last 3 results) No results for input(s): BNP in the last 8760 hours. Basic Metabolic Panel: Recent Labs  Lab 07/17/19 1029 07/18/19 0544  NA 135 136  K 4.9 4.2  CL 98 102  CO2 27 27  GLUCOSE 161* 103*  BUN 21 19  CREATININE 0.99 0.93  CALCIUM 9.4 8.1*   Liver Function Tests: Recent Labs  Lab 07/17/19 1029 07/18/19 0544  AST 17 14*  ALT 14 10  ALKPHOS 77 50  BILITOT 2.0* 1.9*  PROT 7.9 5.7*  ALBUMIN 4.3 3.1*   Recent Labs  Lab 07/17/19 1029  LIPASE 23   No results for input(s): AMMONIA in the last 168 hours. CBC: Recent Labs  Lab 07/17/19 1029 07/18/19 0544  WBC 8.8 9.2  HGB 17.2* 13.1  HCT 49.9* 38.1  MCV 96.7 97.9  PLT 137* 115*   Cardiac Enzymes: No results for input(s): CKTOTAL, CKMB, CKMBINDEX, TROPONINI in the last 168 hours. BNP: Invalid input(s): POCBNP CBG: No results for input(s): GLUCAP in the last 168 hours. D-Dimer No results for input(s): DDIMER in the last 72 hours. Hgb A1c No results for input(s): HGBA1C in the last 72 hours. Lipid Profile No results for input(s): CHOL, HDL, LDLCALC, TRIG, CHOLHDL, LDLDIRECT in the last 72 hours. Thyroid function studies No results for input(s): TSH, T4TOTAL, T3FREE, THYROIDAB in the last 72 hours.  Invalid input(s): FREET3 Anemia work up No results for input(s): VITAMINB12, FOLATE, FERRITIN, TIBC, IRON, RETICCTPCT in the last 72 hours. Urinalysis    Component Value Date/Time   COLORURINE YELLOW (A) 07/17/2019 1029   APPEARANCEUR CLEAR (A) 07/17/2019 1029   APPEARANCEUR Hazy 10/20/2011 0720   LABSPEC  1.018 07/17/2019 1029   LABSPEC 1.008 10/20/2011 0720   PHURINE 6.0 07/17/2019 1029   GLUCOSEU 50 (A) 07/17/2019 1029   GLUCOSEU Negative 10/20/2011 0720   HGBUR SMALL (A) 07/17/2019 1029   BILIRUBINUR NEGATIVE 07/17/2019 1029   BILIRUBINUR Negative 10/20/2011 0720   KETONESUR 5 (A) 07/17/2019 1029   PROTEINUR 100 (A) 07/17/2019 1029   NITRITE  NEGATIVE 07/17/2019 1029   LEUKOCYTESUR MODERATE (A) 07/17/2019 1029   LEUKOCYTESUR 3+ 10/20/2011 0720   Sepsis Labs Invalid input(s): PROCALCITONIN,  WBC,  LACTICIDVEN Microbiology Recent Results (from the past 240 hour(s))  SARS CORONAVIRUS 2 (TAT 6-24 HRS) Nasopharyngeal Nasopharyngeal Swab     Status: None   Collection Time: 07/17/19  2:36 PM   Specimen: Nasopharyngeal Swab  Result Value Ref Range Status   SARS Coronavirus 2 NEGATIVE NEGATIVE Final    Comment: (NOTE) SARS-CoV-2 target nucleic acids are NOT DETECTED. The SARS-CoV-2 RNA is generally detectable in upper and lower respiratory specimens during the acute phase of infection. Negative results do not preclude SARS-CoV-2 infection, do not rule out co-infections with other pathogens, and should not be used as the sole basis for treatment or other patient management decisions. Negative results must be combined with clinical observations, patient history, and epidemiological information. The expected result is Negative. Fact Sheet for Patients: SugarRoll.be Fact Sheet for Healthcare Providers: https://www.woods-mathews.com/ This test is not yet approved or cleared by the [place] FDA and  has been authorized for detection and/or diagnosis of SARS-CoV-2 by FDA under an Emergency Use Authorization (EUA). This EUA will remain  in effect (meaning this test can be used) for the duration of the COVID-19 declaration under Section 56 4(b)(1) of the Act, 21 U.S.C. section 360bbb-3(b)(1), unless the authorization is terminated or revoked  sooner. Performed at [facility], [place] [address] [place], [place] [address]     Time coordinating discharge: Over 30 minutes  SIGNED:  Lorella Nimrod, MD  [facility] 07/18/2019, 2:47 PM Pager [phone]  If 7PM-7AM, please contact night-coverage [email or web address] Password [email or web address]  This record has been created using Systems analyst. Errors have been sought and corrected,but may not always be located. Such creation errors do not reflect on the standard of care.

## 2019-07-18 NOTE — Care Management Obs Status (Signed)
[facility] NOTIFICATION   Patient Details  Name: Tiffany Shaffer MRN: [id number] Date of Birth: [birth year]   Medicare Observation Status Notification Given:  No(admitted obs less thand 24 hours)    Beverly Sessions, RN 07/18/2019, 2:52 PM

## 2019-07-18 NOTE — Progress Notes (Signed)
Tiffany Shaffer to be D/C'd Home per MD order.  Discussed prescriptions and follow up appointments with the patient. Prescriptions given to patient, medication list explained in detail. Pt verbalized understanding.  Allergies as of 07/18/2019      Reactions   Pravastatin Other (See Comments)   Muscle aches      Medication List    TAKE these medications   acetaminophen 500 MG tablet Commonly known as: TYLENOL Take 500-1,000 mg by mouth every 6 (six) hours as needed for mild pain or fever.   chlorthalidone 50 MG tablet Commonly known as: HYGROTON Take 25 mg by mouth daily as needed (fluid retention).   cyanocobalamin 1000 MCG/ML injection Commonly known as: (VITAMIN B-12) Inject into the muscle.   fluocinonide 0.05 % external solution Commonly known as: LIDEX Apply 1 application topically 2 (two) times daily as needed (itching).   furosemide 20 MG tablet Commonly known as: LASIX Take 20 mg by mouth daily.   glucosamine-chondroitin 500-400 MG tablet Take 1 tablet by mouth daily.   metoprolol tartrate 25 MG tablet Commonly known as: LOPRESSOR Take 75 mg by mouth 2 (two) times daily.   ondansetron 4 MG tablet Commonly known as: ZOFRAN Take 1 tablet (4 mg total) by mouth every 6 (six) hours as needed for nausea.   rivaroxaban 20 MG Tabs tablet Commonly known as: XARELTO Take 20 mg by mouth daily with supper.   spironolactone 25 MG tablet Commonly known as: ALDACTONE Take 25 mg by mouth daily.   telmisartan 80 MG tablet Commonly known as: MICARDIS Take 80 mg by mouth daily.       Vitals:   07/18/19 0947 07/18/19 1142  BP: 118/65 110/61  Pulse: 69 80  Resp: 18 18  Temp: 97.8 F (36.6 C) 98.3 F (36.8 C)  SpO2: 95% 97%    Skin clean, dry and intact without evidence of skin break down, no evidence of skin tears noted. IV catheter discontinued intact. Site without signs and symptoms of complications. Dressing and pressure applied. Pt denies pain at this time.  No complaints noted.  An After Visit Summary was printed and given to the patient. Patient escorted via [place], and D/C home via private auto.  Fuller Mandril, RN

## 2019-07-18 NOTE — Progress Notes (Signed)
CC: Partial SBO versus enteritis  Subjective: She is doing well.  No abdominal pain over the last 24 hours.  She did pass flatus and had a bowel movement.  KUB personally reviewed showing evidence of contrast within colon.  No evidence of bowel dilation or obstruction. WBC nml.  Objective: Vital signs in last 24 hours: Temp:  [97.8 F (36.6 C)-98.6 F (37 C)] 97.8 F (36.6 C) (01/22 0947) Pulse Rate:  [69-107] 69 (01/22 0947) Resp:  [15-29] 18 (01/22 0947) BP: (91-186)/(44-112) 118/65 (01/22 0947) SpO2:  [93 %-97 %] 95 % (01/22 0947) Weight:  [83.9 kg-84.3 kg] 84.3 kg (01/21 2136) Last BM Date: 07/18/19  Intake/Output from previous day: 01/21 0701 - 01/22 0700 In: 1876.3 [I.V.:876.3; IV Piggyback:1000] Out: 200 [Urine:200] Intake/Output this shift: No intake/output data recorded.  Physical exam:  NAD , alert Abd: soft, NT, no peritonitis, decrease bs Ext: no edema   Lab Results: CBC  Recent Labs    07/17/19 1029 07/18/19 0544  WBC 8.8 9.2  HGB 17.2* 13.1  HCT 49.9* 38.1  PLT 137* 115*   BMET Recent Labs    07/17/19 1029 07/18/19 0544  NA 135 136  K 4.9 4.2  CL 98 102  CO2 27 27  GLUCOSE 161* 103*  BUN 21 19  CREATININE 0.99 0.93  CALCIUM 9.4 8.1*   PT/INR No results for input(s): LABPROT, INR in the last 72 hours. ABG No results for input(s): PHART, HCO3 in the last 72 hours.  Invalid input(s): PCO2, PO2  Studies/Results: CT ABDOMEN PELVIS W CONTRAST  Result Date: 07/17/2019 CLINICAL DATA:  Vomiting and diarrhea, abdominal pain EXAM: CT ABDOMEN AND PELVIS WITH CONTRAST TECHNIQUE: Multidetector CT imaging of the abdomen and pelvis was performed using the standard protocol following bolus administration of intravenous contrast. CONTRAST:  142mL OMNIPAQUE IOHEXOL 300 MG/ML  SOLN COMPARISON:  01/10/2019 FINDINGS: Lower chest: No acute abnormality.  Cardiomegaly. Hepatobiliary: No focal liver abnormality is seen. Cholelithiasis. No gallbladder wall  thickening or biliary dilatation. Pancreas: Unremarkable. Spleen: Unremarkable. Adrenals/Urinary Tract: Bilateral adrenal nodules likely reflecting adenomas. The kidneys enhance normally. There is no hydronephrosis or calculus identified. Bladder is unremarkable. Stomach/Bowel: Stomach is unremarkable. There is a duodenal diverticulum. There is mild distension of distal small bowel with some fecalization. A transition point to relatively decompressed terminal ileum is present on series 5, image 41. The colon is decompressed with a few scattered diverticula. Fat deposition within the ascending colon wall may reflect chronic inflammation. Anastomoses are evident along the transverse colon. Vascular/Lymphatic: Aortic atherosclerosis. No enlarged abdominal or pelvic lymph nodes. Reproductive: Uterus and bilateral adnexa are unremarkable. Other: No abdominal wall hernia or abnormality. No abdominopelvic ascites. Musculoskeletal: Degenerative changes of the spine. No acute finding. IMPRESSION: Mild distension of distal small bowel with some fecalization of contents at the transition point to relatively decompressed terminal ileum. This likely reflects slow transit or low-grade obstruction. Additional stable findings detailed above. Electronically Signed   By: Macy Mis M.D.   On: 07/17/2019 14:06   DG ABD ACUTE 2+V W 1V CHEST  Result Date: 07/18/2019 CLINICAL DATA:  Small bowel obstruction. EXAM: DG ABDOMEN ACUTE W/ 1V CHEST COMPARISON:  CT of the abdomen and pelvis 07/17/2019 FINDINGS: Heart is enlarged. Atherosclerotic changes are noted at the aortic arch. There is no edema or effusion. No focal airspace disease is present. Oral contrast is evident throughout a nondistended colon. Small bowel is within limits. Diverticular changes again noted in the sigmoid colon. Degenerative changes are noted  in the axial skeleton. IMPRESSION: 1. No evidence for obstruction. 2. Oral contrast is evident throughout a  nondistended colon consistent with transit of bowel contents. 3. Sigmoid diverticulosis. 4. Cardiomegaly without failure. 5. No acute cardiopulmonary disease. Electronically Signed   By: San Morelle M.D.   On: 07/18/2019 07:19    Anti-infectives: Anti-infectives (From admission, onward)   None      Assessment/Plan: Resolving small bowel obstruction.  Advance diet.  No surgical intervention.  We will be available for any surgical needs. I spent 25 minutes in this encounter with greater than 50% spent in coordination and counseling of her care  D. Dahlia Byes, MD, [facility]  07/18/2019

## 2019-07-19 LAB — URINE CULTURE

## 2019-09-04 ENCOUNTER — Observation Stay
Admission: EM | Admit: 2019-09-04 | Discharge: 2019-09-05 | Disposition: A | Discharge status: Home / Self Care | Payer: Medicare Other | Attending: Internal Medicine | Admitting: Internal Medicine

## 2019-09-04 ENCOUNTER — Other Ambulatory Visit: Payer: Self-pay

## 2019-09-04 ENCOUNTER — Encounter: Payer: Self-pay | Admitting: Emergency Medicine

## 2019-09-04 ENCOUNTER — Emergency Department: Payer: Medicare Other

## 2019-09-04 DIAGNOSIS — I10 Essential (primary) hypertension: Secondary | ICD-10-CM | POA: Insufficient documentation

## 2019-09-04 DIAGNOSIS — Z85828 Personal history of other malignant neoplasm of skin: Secondary | ICD-10-CM | POA: Insufficient documentation

## 2019-09-04 DIAGNOSIS — Z20822 Contact with and (suspected) exposure to covid-19: Secondary | ICD-10-CM | POA: Diagnosis not present

## 2019-09-04 DIAGNOSIS — K8020 Calculus of gallbladder without cholecystitis without obstruction: Secondary | ICD-10-CM | POA: Diagnosis not present

## 2019-09-04 DIAGNOSIS — K5730 Diverticulosis of large intestine without perforation or abscess without bleeding: Secondary | ICD-10-CM | POA: Diagnosis not present

## 2019-09-04 DIAGNOSIS — Z86718 Personal history of other venous thrombosis and embolism: Secondary | ICD-10-CM | POA: Diagnosis not present

## 2019-09-04 DIAGNOSIS — R197 Diarrhea, unspecified: Secondary | ICD-10-CM

## 2019-09-04 DIAGNOSIS — E876 Hypokalemia: Secondary | ICD-10-CM | POA: Diagnosis not present

## 2019-09-04 DIAGNOSIS — I4510 Unspecified right bundle-branch block: Secondary | ICD-10-CM | POA: Insufficient documentation

## 2019-09-04 DIAGNOSIS — I739 Peripheral vascular disease, unspecified: Secondary | ICD-10-CM | POA: Diagnosis not present

## 2019-09-04 DIAGNOSIS — I2510 Atherosclerotic heart disease of native coronary artery without angina pectoris: Secondary | ICD-10-CM | POA: Diagnosis not present

## 2019-09-04 DIAGNOSIS — I471 Supraventricular tachycardia: Secondary | ICD-10-CM | POA: Diagnosis not present

## 2019-09-04 DIAGNOSIS — Z79899 Other long term (current) drug therapy: Secondary | ICD-10-CM | POA: Insufficient documentation

## 2019-09-04 DIAGNOSIS — D3501 Benign neoplasm of right adrenal gland: Secondary | ICD-10-CM | POA: Insufficient documentation

## 2019-09-04 DIAGNOSIS — I4891 Unspecified atrial fibrillation: Secondary | ICD-10-CM

## 2019-09-04 DIAGNOSIS — R1010 Upper abdominal pain, unspecified: Secondary | ICD-10-CM

## 2019-09-04 DIAGNOSIS — Z8249 Family history of ischemic heart disease and other diseases of the circulatory system: Secondary | ICD-10-CM | POA: Insufficient documentation

## 2019-09-04 DIAGNOSIS — K429 Umbilical hernia without obstruction or gangrene: Secondary | ICD-10-CM | POA: Diagnosis not present

## 2019-09-04 DIAGNOSIS — Z9049 Acquired absence of other specified parts of digestive tract: Secondary | ICD-10-CM | POA: Insufficient documentation

## 2019-09-04 DIAGNOSIS — R1030 Lower abdominal pain, unspecified: Secondary | ICD-10-CM | POA: Diagnosis not present

## 2019-09-04 DIAGNOSIS — J984 Other disorders of lung: Secondary | ICD-10-CM | POA: Diagnosis not present

## 2019-09-04 DIAGNOSIS — D3502 Benign neoplasm of left adrenal gland: Secondary | ICD-10-CM | POA: Insufficient documentation

## 2019-09-04 DIAGNOSIS — Z7901 Long term (current) use of anticoagulants: Secondary | ICD-10-CM | POA: Insufficient documentation

## 2019-09-04 DIAGNOSIS — R110 Nausea: Secondary | ICD-10-CM

## 2019-09-04 DIAGNOSIS — K56600 Partial intestinal obstruction, unspecified as to cause: Secondary | ICD-10-CM

## 2019-09-04 DIAGNOSIS — Z888 Allergy status to other drugs, medicaments and biological substances status: Secondary | ICD-10-CM | POA: Insufficient documentation

## 2019-09-04 DIAGNOSIS — I480 Paroxysmal atrial fibrillation: Secondary | ICD-10-CM | POA: Insufficient documentation

## 2019-09-04 DIAGNOSIS — K529 Noninfective gastroenteritis and colitis, unspecified: Secondary | ICD-10-CM | POA: Diagnosis not present

## 2019-09-04 DIAGNOSIS — R109 Unspecified abdominal pain: Secondary | ICD-10-CM | POA: Diagnosis present

## 2019-09-04 DIAGNOSIS — Z90710 Acquired absence of both cervix and uterus: Secondary | ICD-10-CM | POA: Insufficient documentation

## 2019-09-04 LAB — CBC
HCT: 49.6 % — ABNORMAL HIGH (ref 36.0–46.0)
Hemoglobin: 17.1 g/dL — ABNORMAL HIGH (ref 12.0–15.0)
MCH: 33.5 pg (ref 26.0–34.0)
MCHC: 34.5 g/dL (ref 30.0–36.0)
MCV: 97.3 fL (ref 80.0–100.0)
Platelets: 148 10*3/uL — ABNORMAL LOW (ref 150–400)
RBC: 5.1 MIL/uL (ref 3.87–5.11)
RDW: 12.7 % (ref 11.5–15.5)
WBC: 7.8 10*3/uL (ref 4.0–10.5)
nRBC: 0 % (ref 0.0–0.2)

## 2019-09-04 LAB — URINALYSIS, COMPLETE (UACMP) WITH MICROSCOPIC
Bacteria, UA: NONE SEEN
Bilirubin Urine: NEGATIVE
Glucose, UA: NEGATIVE mg/dL
Ketones, ur: NEGATIVE mg/dL
Nitrite: NEGATIVE
Protein, ur: NEGATIVE mg/dL
Specific Gravity, Urine: 1.015 (ref 1.005–1.030)
pH: 5 (ref 5.0–8.0)

## 2019-09-04 LAB — LIPASE, BLOOD: Lipase: 23 U/L (ref 11–51)

## 2019-09-04 LAB — COMPREHENSIVE METABOLIC PANEL
ALT: 15 U/L (ref 0–44)
AST: 16 U/L (ref 15–41)
Albumin: 4.6 g/dL (ref 3.5–5.0)
Alkaline Phosphatase: 72 U/L (ref 38–126)
Anion gap: 8 (ref 5–15)
BUN: 19 mg/dL (ref 8–23)
CO2: 28 mmol/L (ref 22–32)
Calcium: 9.5 mg/dL (ref 8.9–10.3)
Chloride: 99 mmol/L (ref 98–111)
Creatinine, Ser: 1.02 mg/dL — ABNORMAL HIGH (ref 0.44–1.00)
GFR calc Af Amer: 58 mL/min — ABNORMAL LOW (ref >60)
GFR calc non Af Amer: 50 mL/min — ABNORMAL LOW (ref >60)
Glucose, Bld: 125 mg/dL — ABNORMAL HIGH (ref 70–99)
Potassium: 5.2 mmol/L — ABNORMAL HIGH (ref 3.5–5.1)
Sodium: 135 mmol/L (ref 135–145)
Total Bilirubin: 1.7 mg/dL — ABNORMAL HIGH (ref 0.3–1.2)
Total Protein: 8 g/dL (ref 6.5–8.1)

## 2019-09-04 LAB — SARS CORONAVIRUS 2 (TAT 6-24 HRS): SARS Coronavirus 2: NEGATIVE

## 2019-09-04 MED ORDER — SODIUM CHLORIDE 0.9 % IV SOLN: INTRAVENOUS | Status: DC

## 2019-09-04 MED ORDER — OXYCODONE HCL 5 MG PO TABS
5.0000 mg | ORAL_TABLET | ORAL | Status: DC | PRN
  Administered 2019-09-04: 5 mg via ORAL
  Filled 2019-09-04: qty 1

## 2019-09-04 MED ORDER — MORPHINE SULFATE (PF) 4 MG/ML IV SOLN
4.0000 mg | Freq: Once | INTRAVENOUS | Status: AC
  Administered 2019-09-04: 4 mg via INTRAVENOUS
  Filled 2019-09-04: qty 1

## 2019-09-04 MED ORDER — ONDANSETRON 4 MG PO TBDP
4.0000 mg | ORAL_TABLET | Freq: Once | ORAL | Status: AC
  Administered 2019-09-04: 4 mg via ORAL
  Filled 2019-09-04: qty 1

## 2019-09-04 MED ORDER — SODIUM CHLORIDE 0.9 % IV BOLUS
1000.0000 mL | Freq: Once | INTRAVENOUS | Status: AC
  Administered 2019-09-04: 1000 mL via INTRAVENOUS

## 2019-09-04 MED ORDER — METRONIDAZOLE IN NACL 5-0.79 MG/ML-% IV SOLN
500.0000 mg | Freq: Three times a day (TID) | INTRAVENOUS | Status: DC
  Administered 2019-09-04 – 2019-09-05 (×3): 500 mg via INTRAVENOUS
  Filled 2019-09-04 (×6): qty 100

## 2019-09-04 MED ORDER — ONDANSETRON HCL 4 MG/2ML IJ SOLN
4.0000 mg | Freq: Four times a day (QID) | INTRAMUSCULAR | Status: DC | PRN
  Administered 2019-09-04: 4 mg via INTRAVENOUS
  Filled 2019-09-04: qty 2

## 2019-09-04 MED ORDER — ACETAMINOPHEN 500 MG PO TABS: 500.0000 mg | ORAL_TABLET | Freq: Four times a day (QID) | ORAL | Status: DC | PRN

## 2019-09-04 MED ORDER — SPIRONOLACTONE 25 MG PO TABS: 25.0000 mg | ORAL_TABLET | Freq: Every day | ORAL | Status: DC

## 2019-09-04 MED ORDER — FUROSEMIDE 40 MG PO TABS: 20.0000 mg | ORAL_TABLET | Freq: Every day | ORAL | Status: DC

## 2019-09-04 MED ORDER — OXYCODONE HCL 5 MG PO TABS
5.0000 mg | ORAL_TABLET | Freq: Once | ORAL | Status: AC
  Administered 2019-09-04: 5 mg via ORAL
  Filled 2019-09-04: qty 1

## 2019-09-04 MED ORDER — TRAZODONE HCL 50 MG PO TABS: 25.0000 mg | ORAL_TABLET | Freq: Every evening | ORAL | Status: DC | PRN

## 2019-09-04 MED ORDER — ALBUTEROL SULFATE (2.5 MG/3ML) 0.083% IN NEBU: 2.5000 mg | INHALATION_SOLUTION | RESPIRATORY_TRACT | Status: DC | PRN

## 2019-09-04 MED ORDER — METOPROLOL TARTRATE 50 MG PO TABS
75.0000 mg | ORAL_TABLET | Freq: Two times a day (BID) | ORAL | Status: DC
  Administered 2019-09-04: 75 mg via ORAL
  Filled 2019-09-04 (×2): qty 1

## 2019-09-04 MED ORDER — ONDANSETRON HCL 4 MG/2ML IJ SOLN
4.0000 mg | Freq: Once | INTRAMUSCULAR | Status: DC
  Filled 2019-09-04: qty 2

## 2019-09-04 MED ORDER — IOHEXOL 300 MG/ML  SOLN
100.0000 mL | Freq: Once | INTRAMUSCULAR | Status: AC | PRN
  Administered 2019-09-04: 100 mL via INTRAVENOUS

## 2019-09-04 MED ORDER — IRBESARTAN 75 MG PO TABS
75.0000 mg | ORAL_TABLET | Freq: Every day | ORAL | Status: DC
  Administered 2019-09-04 – 2019-09-05 (×2): 75 mg via ORAL
  Filled 2019-09-04 (×2): qty 1

## 2019-09-04 MED ORDER — ONDANSETRON HCL 4 MG PO TABS: 4.0000 mg | ORAL_TABLET | Freq: Four times a day (QID) | ORAL | Status: DC | PRN

## 2019-09-04 MED ORDER — MORPHINE SULFATE (PF) 2 MG/ML IV SOLN: 2.0000 mg | INTRAVENOUS | Status: DC | PRN

## 2019-09-04 MED ORDER — SODIUM CHLORIDE 0.9 % IV SOLN
INTRAVENOUS | Status: DC | PRN
  Administered 2019-09-04: 250 mL via INTRAVENOUS

## 2019-09-04 MED ORDER — CIPROFLOXACIN IN D5W 400 MG/200ML IV SOLN
400.0000 mg | Freq: Two times a day (BID) | INTRAVENOUS | Status: DC
  Administered 2019-09-04 – 2019-09-05 (×2): 400 mg via INTRAVENOUS
  Filled 2019-09-04 (×4): qty 200

## 2019-09-04 MED ORDER — RIVAROXABAN 20 MG PO TABS
20.0000 mg | ORAL_TABLET | Freq: Every day | ORAL | Status: DC
  Administered 2019-09-04: 20 mg via ORAL
  Filled 2019-09-04 (×2): qty 1

## 2019-09-04 MED ORDER — HYDRALAZINE HCL 20 MG/ML IJ SOLN
10.0000 mg | INTRAMUSCULAR | Status: DC | PRN
  Administered 2019-09-04: 10 mg via INTRAVENOUS
  Filled 2019-09-04: qty 1

## 2019-09-04 NOTE — ED Notes (Signed)
IV team consult placed myself and RN Manuela Schwartz attempted x 3 times to establish IV access.

## 2019-09-04 NOTE — H&P (Signed)
History and Physical    Tiffany Shaffer [id number] DOB: Oct 24, 1931 DOA: 09/04/2019  PCP: Sallee Lange, NP  Patient coming from: Home  I have personally briefly reviewed patient's old medical records in [place]  Chief Complaint: Abdominal pain, nausea, vomiting  HPI: Tiffany Shaffer is a 86 y.o. female with medical history significant of previous small bowel obstruction, partial colectomy, atrial fibrillation, chronic anticoagulation on Xarelto, hypertension, chronic abdominal pain who presents to the emergency department with chief complaint of lower abdominal pain associated with nausea and diarrhea.  Patient states the pain has been going on for approximately 4 days.  Associated with cramping, bloating, poor p.o. intake, nausea and vomiting.  Initial presentation to the emergency department pain is described as 7 out of 10 in severity.  No radiation.  No evidence of bleeding or alleviating factors endorsed.  Patient does endorse several episodes of nonbloody diarrhea.  Patient had a recent admission in January 2021 where she was treated for similar symptoms.  During that admission no surgical intervention was warranted.  General surgery was consulted and recommended conservative medical management.  Patient presents with symptoms similar to the previous presentation.  Overall laboratory work-up within normal limits.  Patient has mild hypokalemia and mildly increased hemoglobin at 17.1 otherwise overall unrevealing.  Given patient's medical history and persistent symptoms despite treatment in the ED hospitalist was consulted for admission  ED Course: Patient vital signs within normal limits.  Considering complaints of patient history she underwent a CAT scan of the abdomen.  CAT scan revealed a stable appearing partial SBO as noted in previous CAT scan in December 2021.  There were also some pericolonic inflammatory changes consistent with acute enteritis.  Patient was given  pain control and intravenous fluids and hospitalist was consulted for admission.  Review of Systems: As per HPI otherwise 10 point review of systems negative.    Past Medical History:  Diagnosis Date  . Atrial fibrillation ([place])   . Cancer ([place]) 08/29/2016   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  . Headache    behind right eye  . History of blood clots 07/10/2013   Left lower leg  . PVD (peripheral vascular disease) ([place])   . SVT (supraventricular tachycardia) ([place])   . Wears dentures    full upper, p[artial lower    Past Surgical History:  Procedure Laterality Date  . ABDOMINAL HYSTERECTOMY    . angiogram legs    . BREAST BIOPSY Left    neg  . BREAST BIOPSY Right    neg  . BREAST BIOPSY Left 07/01/2018   ribbon clip, [place] Bx, pending path   . BROW LIFT Bilateral 07/10/2017   Procedure: BLEPHAROPLASTY  UPPER EYELID W EXCESS SKIN;  Surgeon: Karle Starch, MD;  Location: [facility];  Service: Ophthalmology;  Laterality: Bilateral;  . cataracts    . COLON RESECTION    . HERNIA REPAIR    . PTOSIS REPAIR Bilateral 07/10/2017   Procedure: PTOSIS REPAIR RESECT EX;  Surgeon: Karle Starch, MD;  Location: [facility];  Service: Ophthalmology;  Laterality: Bilateral;     reports that she has never smoked. She has never used smokeless tobacco. She reports current alcohol use of about 14.0 standard drinks of alcohol per week. She reports that she does not use drugs.  Allergies  Allergen Reactions  . Pravastatin Other (See Comments)    Muscle aches    Family History  Problem Relation Age of Onset  .  CVA Mother   . CAD Father   . Breast cancer Neg Hx     Prior to Admission medications   Medication Sig Start Date End Date Taking? Authorizing Provider  cyanocobalamin (,VITAMIN B-12,) 1000 MCG/ML injection Inject 1,000 mcg into the muscle every 6 (six) weeks.  11/26/18  Yes [provider]  furosemide (LASIX) 20 MG tablet Take 20 mg by mouth daily.  12/24/18  12/24/19 Yes [provider]  glucosamine-chondroitin 500-400 MG tablet Take 1 tablet by mouth daily.   Yes [provider]  metoprolol tartrate (LOPRESSOR) 25 MG tablet Take 75 mg by mouth 2 (two) times daily.   Yes [provider]  rivaroxaban (XARELTO) 20 MG TABS tablet Take 20 mg by mouth daily with supper.   Yes [provider]  spironolactone (ALDACTONE) 25 MG tablet Take 25 mg by mouth daily.  10/28/18 10/28/19 Yes [provider]  telmisartan (MICARDIS) 80 MG tablet Take 80 mg by mouth daily.  04/10/19 04/09/20 Yes [provider]  acetaminophen (TYLENOL) 500 MG tablet Take 500-1,000 mg by mouth every 6 (six) hours as needed for mild pain or fever.     [provider]  chlorthalidone (HYGROTON) 50 MG tablet Take 25 mg by mouth daily as needed (fluid retention).     [provider]  fluocinonide (LIDEX) 0.05 % external solution Apply 1 application topically 2 (two) times daily as needed (itching).     [provider]  ondansetron (ZOFRAN) 4 MG tablet Take 1 tablet (4 mg total) by mouth every 6 (six) hours as needed for nausea. 07/18/19   Lorella Nimrod, MD    Physical Exam: Vitals:   09/04/19 1030 09/04/19 1100 09/04/19 1200 09/04/19 1330  BP: (!) 177/108 (!) 179/94 (!) 194/83 (!) 165/93  Pulse: 96 85 91 (!) 103  Resp: 20 20 (!) 22 18  Temp:      TempSrc:      SpO2: 96% 96% 97% 92%  Weight:      Height:         Vitals:   09/04/19 1030 09/04/19 1100 09/04/19 1200 09/04/19 1330  BP: (!) 177/108 (!) 179/94 (!) 194/83 (!) 165/93  Pulse: 96 85 91 (!) 103  Resp: 20 20 (!) 22 18  Temp:      TempSrc:      SpO2: 96% 96% 97% 92%  Weight:      Height:       Constitutional: NAD, calm, comfortable Eyes: PERRL, lids and conjunctivae normal ENMT: Mucous membranes are moist. Posterior pharynx clear of any exudate or lesions.Normal dentition.  Neck: normal, supple, no masses, no thyromegaly Respiratory: clear to  auscultation bilaterally, no wheezing, no crackles. Normal respiratory effort. No accessory muscle use.  Cardiovascular: Regular rate and rhythm, no murmurs / rubs / gallops. No extremity edema. 2+ pedal pulses. No carotid bruits.  Abdomen: Tender to palpation in periumbilical region and left flank.  Belly soft.  Normal bowel sounds  musculoskeletal: no clubbing / cyanosis. No joint deformity upper and lower extremities. Good ROM, no contractures. Normal muscle tone.  Skin: no rashes, lesions, ulcers. No induration Neurologic: CN 2-12 grossly intact. Sensation intact, DTR normal. Strength 5/5 in all 4.  Psychiatric: Normal judgment and insight. Alert and oriented x 3. Normal mood.    Labs on Admission: I have personally reviewed following labs and imaging studies  CBC: Recent Labs  Lab 09/04/19 1002  WBC 7.8  HGB 17.1*  HCT 49.6*  MCV 97.3  PLT 123456*   Basic Metabolic Panel: Recent Labs  Lab 09/04/19 1002  NA 135  K 5.2*  CL 99  CO2 28  GLUCOSE 125*  BUN 19  CREATININE 1.02*  CALCIUM 9.5   GFR: Estimated Creatinine Clearance: 43.2 mL/min (A) (by C-G formula based on SCr of 1.02 mg/dL (H)). Liver Function Tests: Recent Labs  Lab 09/04/19 1002  AST 16  ALT 15  ALKPHOS 72  BILITOT 1.7*  PROT 8.0  ALBUMIN 4.6   Recent Labs  Lab 09/04/19 1002  LIPASE 23   No results for input(s): AMMONIA in the last 168 hours. Coagulation Profile: No results for input(s): INR, PROTIME in the last 168 hours. Cardiac Enzymes: No results for input(s): CKTOTAL, CKMB, CKMBINDEX, TROPONINI in the last 168 hours. BNP (last 3 results) No results for input(s): PROBNP in the last 8760 hours. HbA1C: No results for input(s): HGBA1C in the last 72 hours. CBG: No results for input(s): GLUCAP in the last 168 hours. Lipid Profile: No results for input(s): CHOL, HDL, LDLCALC, TRIG, CHOLHDL, LDLDIRECT in the last 72 hours. Thyroid Function Tests: No results for input(s): TSH, T4TOTAL,  FREET4, T3FREE, THYROIDAB in the last 72 hours. Anemia Panel: No results for input(s): VITAMINB12, FOLATE, FERRITIN, TIBC, IRON, RETICCTPCT in the last 72 hours. Urine analysis:    Component Value Date/Time   COLORURINE YELLOW (A) 09/04/2019 1002   APPEARANCEUR HAZY (A) 09/04/2019 1002   APPEARANCEUR Hazy 10/20/2011 0720   LABSPEC 1.015 09/04/2019 1002   LABSPEC 1.008 10/20/2011 0720   PHURINE 5.0 09/04/2019 1002   GLUCOSEU NEGATIVE 09/04/2019 1002   GLUCOSEU Negative 10/20/2011 0720   HGBUR SMALL (A) 09/04/2019 1002   BILIRUBINUR NEGATIVE 09/04/2019 1002   BILIRUBINUR Negative 10/20/2011 0720   KETONESUR NEGATIVE 09/04/2019 1002   PROTEINUR NEGATIVE 09/04/2019 1002   NITRITE NEGATIVE 09/04/2019 1002   LEUKOCYTESUR MODERATE (A) 09/04/2019 1002   LEUKOCYTESUR 3+ 10/20/2011 0720    Radiological Exams on Admission: CT Abdomen Pelvis W Contrast  Result Date: 09/04/2019 CLINICAL DATA:  Upper abdominal pain radiating bilaterally. The patient reports the pain is similar to when she had a previous bowel obstruction. EXAM: CT ABDOMEN AND PELVIS WITH CONTRAST TECHNIQUE: Multidetector CT imaging of the abdomen and pelvis was performed using the standard protocol following bolus administration of intravenous contrast. CONTRAST:  167mL OMNIPAQUE IOHEXOL 300 MG/ML  SOLN COMPARISON:  07/17/2019 and 01/10/2019 FINDINGS: Lower chest: Stable enlarged heart and dense coronary artery calcifications. Minimal atelectasis/scarring in the anterior left lower lobe laterally. Hepatobiliary: Normal appearing liver. Gallstones in the dependent portion of the gallbladder measuring up to 1.4 cm in diameter each. No gallbladder wall thickening or pericholecystic fluid. Pancreas: Unremarkable. No pancreatic ductal dilatation or surrounding inflammatory changes. Spleen: Normal in size without focal abnormality. Adrenals/Urinary Tract: Stable bilateral adrenal masses. The mass on the right measures 3.0 x 1.7 cm on image  number 23 series 2. This was previously shown to be stable over multiple years. The larger mass on the left measures 1.9 x 1.4 cm on image number 22 series 2 and the smaller mass on the left measures 1.4 x 1.2 cm on image number 20 series 2. Unremarkable kidneys, ureters and urinary bladder. Stomach/Bowel: Mild dilatation of multiple small bowel loops in the abdomen and pelvis with fecalization of contents in the left pelvis, without significant change. There is gradual transition to distal ileum with interval mild-to-moderate diffuse low density wall thickening, including the terminal ileum. The colon remains normal in caliber. Multiple sigmoid  colon diverticula are noted. Again demonstrated are transverse colon anastomoses and adjacent surgical clips. Unremarkable stomach. Vascular/Lymphatic: Atheromatous arterial calcifications without aneurysm. No enlarged lymph nodes. Reproductive: Status post hysterectomy. No adnexal masses. Other: Small amount of free peritoneal fluid in the pelvis with mild progression. No free peritoneal air. Small umbilical hernia containing fat. Musculoskeletal: Lumbar and lower thoracic spine degenerative changes and mild levoconvex lumbar scoliosis. These include facet degenerative changes with associated grade 1 anterolisthesis at the L3-4 level, without significant change. IMPRESSION: 1. Interval mild to moderate diffuse low density wall thickening including the terminal ileum, compatible with infectious or inflammatory enteritis. 2. Stable partial small bowel obstruction with a gradual transition to more normal caliber distal ileum. This may be due to the currently demonstrated changes of distal ileitis. A chronic partially obstructing adhesion is also a possibility. 3. Sigmoid diverticulosis. 4. Cholelithiasis without evidence of cholecystitis. 5. Stable bilateral adrenal adenomas. 6. Stable cardiomegaly and dense coronary artery calcifications. 7. Small amount of free peritoneal  fluid in the pelvis with mild progression. Electronically Signed   By: Claudie Revering M.D.   On: 09/04/2019 12:50    EKG: Independently reviewed.  Atrial fibrillation with controlled ventricular response  Assessment/Plan Active Problems:   Abdominal pain  Acute enteritis Abdominal pain Intractable nausea vomiting Diarrhea Partial SBO Suspect the symptoms are related to acute enteritis Exam not consistent with surgical abdomen at this time Patient hemodynamically stable with out elevated white count Plan: Place in observation IV fluid resuscitation Clear liquid diet Ciprofloxacin 400 mg IV every 12 hours Metronidazole 500 mg IV every 8 hours Multimodal pain regimen Antiemetic regimen We will tentatively plan for discharge on 09/05/2019.  Abdominal pain persistent will reach out to general surgery  Paroxysmal atrial fibrillation, controlled ventricular response Patient is rate controlled No chest pain Plan: Continue Toprol tartrate 75 mg twice daily per home dose Continue home Xarelto  Hypertension Continue metoprolol Continue home Avapro    DVT prophylaxis: Xarelto Code Status: Full Family Communication: Offered to call patient's family member.  She declined Disposition Plan: Anticipate return to home environment on 09/05/2019 Consults called: None Admission status: Observation   Sidney Ace MD [facility] Pager [phone]   If 7PM-7AM, please contact night-coverage   09/04/2019, 2:57 PM

## 2019-09-04 NOTE — ED Notes (Addendum)
Pt used bathroom prior to going to CT. Pt trx to CT

## 2019-09-04 NOTE — ED Provider Notes (Signed)
[facility] Emergency Department Provider Note  ____________________________________________   First MD Initiated Contact with Patient 09/04/19 1030     (approximate)  I have reviewed the triage vital signs and the nursing notes.  History  Chief Complaint Abdominal Pain    HPI Tiffany Shaffer is a 86 y.o. female with a history of small bowel obstruction, partial colectomy who presents to the emergency department for upper abdominal pain, nausea, diarrhea.  Patient states abdominal pain started approximately 4 days ago, located across her upper abdomen, feels cramping and bloated.  Constant since onset and progressively worsening in severity.  Currently 7/10 in severity in the ED.  No radiation, no alleviating or aggravating components.  Associated with nausea, but no vomiting.  Yesterday, started with multiple episodes of nonbloody diarrhea.  No fevers.  States symptoms feel similar to prior episodes of SBO.  No chest pain or shortness of breath.  No urinary symptoms.   Past Medical Hx Past Medical History:  Diagnosis Date  . Atrial fibrillation ([place])   . Cancer ([place]) 08/29/2016   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  . Headache    behind right eye  . History of blood clots 07/10/2013   Left lower leg  . PVD (peripheral vascular disease) ([place])   . SVT (supraventricular tachycardia) ([place])   . Wears dentures    full upper, p[artial lower    Problem List Patient Active Problem List   Diagnosis Date Noted  . Small bowel obstruction ([place]) 09/13/2018  . Essential hypertension, benign 06/23/2016  . Atrial fibrillation ([place]) 06/23/2016  . PVD (peripheral vascular disease) ([place]) 06/23/2016    Past Surgical Hx Past Surgical History:  Procedure Laterality Date  . ABDOMINAL HYSTERECTOMY    . angiogram legs    . BREAST BIOPSY Left    neg  . BREAST BIOPSY Right    neg  . BREAST BIOPSY Left 07/01/2018   ribbon clip, [place] Bx, pending path   . BROW LIFT  Bilateral 07/10/2017   Procedure: BLEPHAROPLASTY  UPPER EYELID W EXCESS SKIN;  Surgeon: Karle Starch, MD;  Location: [place];  Service: Ophthalmology;  Laterality: Bilateral;  . cataracts    . COLON RESECTION    . HERNIA REPAIR    . PTOSIS REPAIR Bilateral 07/10/2017   Procedure: PTOSIS REPAIR RESECT EX;  Surgeon: Karle Starch, MD;  Location: [place];  Service: Ophthalmology;  Laterality: Bilateral;    Medications Prior to Admission medications   Medication Sig Start Date End Date Taking? Authorizing Provider  cyanocobalamin (,VITAMIN B-12,) 1000 MCG/ML injection Inject 1,000 mcg into the muscle every 6 (six) weeks.  11/26/18  Yes [provider]  furosemide (LASIX) 20 MG tablet Take 20 mg by mouth daily.  12/24/18 12/24/19 Yes [provider]  glucosamine-chondroitin 500-400 MG tablet Take 1 tablet by mouth daily.   Yes [provider]  metoprolol tartrate (LOPRESSOR) 25 MG tablet Take 75 mg by mouth 2 (two) times daily.   Yes [provider]  rivaroxaban (XARELTO) 20 MG TABS tablet Take 20 mg by mouth daily with supper.   Yes [provider]  spironolactone (ALDACTONE) 25 MG tablet Take 25 mg by mouth daily.  10/28/18 10/28/19 Yes [provider]  telmisartan (MICARDIS) 80 MG tablet Take 80 mg by mouth daily.  04/10/19 04/09/20 Yes [provider]  acetaminophen (TYLENOL) 500 MG tablet Take 500-1,000 mg by mouth every 6 (six) hours as needed for mild pain or  fever.     [provider]  chlorthalidone (HYGROTON) 50 MG tablet Take 25 mg by mouth daily as needed (fluid retention).     [provider]  fluocinonide (LIDEX) 0.05 % external solution Apply 1 application topically 2 (two) times daily as needed (itching).     [provider]  ondansetron (ZOFRAN) 4 MG tablet Take 1 tablet (4 mg total) by mouth every 6 (six) hours as needed for nausea. 07/18/19   Lorella Nimrod, MD     Allergies Pravastatin  Family Hx Family History  Problem Relation Age of Onset  . CVA Mother   . CAD Father   . Breast cancer Neg Hx     Social Hx Social History   Tobacco Use  . Smoking status: Never Smoker  . Smokeless tobacco: Never Used  Substance Use Topics  . Alcohol use: Yes    Alcohol/week: 14.0 standard drinks    Types: 14 Shots of liquor per week  . Drug use: No     Review of Systems  Constitutional: Negative for fever, chills. Eyes: Negative for visual changes. ENT: Negative for sore throat. Cardiovascular: Negative for chest pain. Respiratory: Negative for shortness of breath. Gastrointestinal: Positive for nausea, abdominal pain, diarrhea. Genitourinary: Negative for dysuria. Musculoskeletal: Negative for leg swelling. Skin: Negative for rash. Neurological: Negative for headaches.   Physical Exam  Vital Signs: ED Triage Vitals  Enc Vitals Group     BP 09/04/19 1004 (!) 184/114     Pulse Rate 09/04/19 1004 97     Resp 09/04/19 1004 18     Temp 09/04/19 1004 98.5 F (36.9 C)     Temp Source 09/04/19 1004 Oral     SpO2 09/04/19 1004 96 %     Weight 09/04/19 0955 185 lb (83.9 kg)     Height 09/04/19 0955 5\' 5"  (1.651 m)     Head Circumference --      Peak Flow --      Pain Score 09/04/19 0954 7     Pain Loc --      Pain Edu? --      Excl. in [place]? --     Constitutional: Alert and oriented.  Head: Normocephalic. Atraumatic. Eyes: Conjunctivae clear, sclera anicteric. Pupils equal and symmetric. Nose: No masses or lesions. No congestion or rhinorrhea. Mouth/Throat: Wearing mask.  Neck: No stridor. Trachea midline.  Cardiovascular: Normal rate, regular rhythm. Extremities well perfused. Respiratory: Normal respiratory effort.  Lungs CTAB. Gastrointestinal: Soft.  Mild TTP across upper abdomen.  No rebound or guarding.  No peritoneal signs.  Lower abdomen is soft and nontender. Genitourinary: Deferred. Musculoskeletal: No lower extremity  edema. No deformities. Neurologic:  Normal speech and language. No gross focal or lateralizing neurologic deficits are appreciated.  Skin: Skin is warm, dry and intact. No rash noted. Psychiatric: Mood and affect are appropriate for situation.  EKG  Personally reviewed and interpreted by myself.   Rate: 83 Rhythm: atrial fibrillation Axis: borderline right Intervals: WNL Right bundle branch block, atrial fibrillation, rate controlled No STEMI    Radiology  CT abdomen/pelvis IMPRESSION:  1. Interval mild to moderate diffuse low density wall thickening  including the terminal ileum, compatible with infectious or  inflammatory enteritis.  2. Stable partial small bowel obstruction with a gradual transition  to more normal caliber distal ileum. This may be due to the  currently demonstrated changes of distal ileitis. A chronic  partially obstructing adhesion is also a possibility.  3. Sigmoid diverticulosis.  4. Cholelithiasis without evidence of cholecystitis.  5. Stable bilateral adrenal adenomas.  6. Stable cardiomegaly and dense coronary artery calcifications.  7. Small amount of free peritoneal fluid in the pelvis with mild  progression.    Procedures  Procedure(s) performed (including critical care):  Procedures   Initial Impression / Assessment and Plan / MDM / ED Course  86 y.o. female with history of SBO, partial colectomy who presents to the ED for upper abdominal pain, bloating, nausea, diarrhea.  Ddx: colitis, gastroenteritis, SBO (would anticipate partial, if so, given diarrhea).  Will evaluate with labs, imaging.  Place IV for fluids, symptom control and reassess.  CT imaging as above, notable for enteritis.  Stable partial small bowel obstruction.  Patient still reporting pain and nausea, will give repeat dosing of symptom control and reassess, if continues to be symptomatic we will proceed with admission.  Reassessed patient, reports some improved after  IV morphine. Engaged in shared decision making regarding discharge with Rx for pain and nausea control and close outpatient follow up vs potential observation admission for continued symptom control here via IV and then transition to oral, as well as continued hydration. At this time she favors admission given she still has mild/moderate symptoms and did not feel significant relief with the PO oxycodone. Feel this is reasonable. Discussed w/ hospitalist for admission.   _______________________________  As part of my medical decision making I have reviewed available labs, radiology tests, reviewed old records.  Final Clinical Impression(s) / ED Diagnosis  Final diagnoses:  Enteritis  Upper abdominal pain  Nausea  Diarrhea in adult patient       Note:  This document was prepared using Dragon voice recognition software and may include unintentional dictation errors.   Lilia Pro., MD 09/04/19 [phone]

## 2019-09-04 NOTE — ED Notes (Signed)
Pt states she has a hx of intestinal blockage last March, and she feels like she has a blockage again. Pt states she has upper abdominal pain that radiates to the left and to the right some.

## 2019-09-04 NOTE — ED Notes (Signed)
Admitting provider at bedside.

## 2019-09-04 NOTE — ED Triage Notes (Addendum)
Patient presents to the ED with mid-abdominal pain.  Patient states she has a history of intestinal blockages.  Patient states she feels like, "it's coming on".  Patient reports diarrhea x 8 in the past 24 hours, denies vomiting.

## 2019-09-05 DIAGNOSIS — R1030 Lower abdominal pain, unspecified: Secondary | ICD-10-CM | POA: Diagnosis not present

## 2019-09-05 DIAGNOSIS — K5651 Intestinal adhesions [bands], with partial obstruction: Secondary | ICD-10-CM | POA: Diagnosis not present

## 2019-09-05 DIAGNOSIS — K529 Noninfective gastroenteritis and colitis, unspecified: Secondary | ICD-10-CM | POA: Diagnosis not present

## 2019-09-05 LAB — CBC
HCT: 42.8 % (ref 36.0–46.0)
Hemoglobin: 14.2 g/dL (ref 12.0–15.0)
MCH: 33.1 pg (ref 26.0–34.0)
MCHC: 33.2 g/dL (ref 30.0–36.0)
MCV: 99.8 fL (ref 80.0–100.0)
Platelets: 132 10*3/uL — ABNORMAL LOW (ref 150–400)
RBC: 4.29 MIL/uL (ref 3.87–5.11)
RDW: 13.4 % (ref 11.5–15.5)
WBC: 7.6 10*3/uL (ref 4.0–10.5)
nRBC: 0 % (ref 0.0–0.2)

## 2019-09-05 LAB — COMPREHENSIVE METABOLIC PANEL
ALT: 11 U/L (ref 0–44)
AST: 15 U/L (ref 15–41)
Albumin: 3.4 g/dL — ABNORMAL LOW (ref 3.5–5.0)
Alkaline Phosphatase: 52 U/L (ref 38–126)
Anion gap: 6 (ref 5–15)
BUN: 15 mg/dL (ref 8–23)
CO2: 26 mmol/L (ref 22–32)
Calcium: 8.2 mg/dL — ABNORMAL LOW (ref 8.9–10.3)
Chloride: 103 mmol/L (ref 98–111)
Creatinine, Ser: 1.02 mg/dL — ABNORMAL HIGH (ref 0.44–1.00)
GFR calc Af Amer: 58 mL/min — ABNORMAL LOW (ref >60)
GFR calc non Af Amer: 50 mL/min — ABNORMAL LOW (ref >60)
Glucose, Bld: 101 mg/dL — ABNORMAL HIGH (ref 70–99)
Potassium: 4.3 mmol/L (ref 3.5–5.1)
Sodium: 135 mmol/L (ref 135–145)
Total Bilirubin: 1.4 mg/dL — ABNORMAL HIGH (ref 0.3–1.2)
Total Protein: 6.1 g/dL — ABNORMAL LOW (ref 6.5–8.1)

## 2019-09-05 MED ORDER — METRONIDAZOLE 500 MG PO TABS: 500.0000 mg | ORAL_TABLET | Freq: Three times a day (TID) | ORAL | 0 refills | Status: AC

## 2019-09-05 MED ORDER — CIPROFLOXACIN HCL 500 MG PO TABS: 500.0000 mg | ORAL_TABLET | Freq: Two times a day (BID) | ORAL | 0 refills | Status: AC

## 2019-09-05 MED ORDER — ONDANSETRON HCL 4 MG PO TABS: 4.0000 mg | ORAL_TABLET | Freq: Every day | ORAL | 1 refills | Status: DC | PRN

## 2019-09-05 NOTE — Discharge Summary (Signed)
Physician Discharge Summary  Tiffany Shaffer [id number] DOB: [birth year] DOA: 09/04/2019  PCP: Sallee Lange, NP  Admit date: 09/04/2019 Discharge date: 09/05/2019  Admitted From: Home Disposition: Home  Recommendations for Outpatient Follow-up:  1. Follow up with PCP in 1 week 2.   Home Health: No Equipment/Devices: None Discharge Condition: Stable CODE STATUS: Full Diet recommendation: Heart Healthy  Brief/Interim Summary: HPI: Tiffany Shaffer is a 86 y.o. female with medical history significant of previous small bowel obstruction, partial colectomy, atrial fibrillation, chronic anticoagulation on Xarelto, hypertension, chronic abdominal pain who presents to the emergency department with chief complaint of lower abdominal pain associated with nausea and diarrhea.  Patient states the pain has been going on for approximately 4 days.  Associated with cramping, bloating, poor p.o. intake, nausea and vomiting.  Initial presentation to the emergency department pain is described as 7 out of 10 in severity.  No radiation.  No evidence of bleeding or alleviating factors endorsed.  Patient does endorse several episodes of nonbloody diarrhea.  Patient had a recent admission in January 2021 where she was treated for similar symptoms.  During that admission no surgical intervention was warranted.  General surgery was consulted and recommended conservative medical management.  Patient presents with symptoms similar to the previous presentation.  Overall laboratory work-up within normal limits.  Patient has mild hypokalemia and mildly increased hemoglobin at 17.1 otherwise overall unrevealing.  Given patient's medical history and persistent symptoms despite treatment in the ED hospitalist was consulted for admission  3/12: Patient seen and examined.  Abdominal pain improved substantially over interval.  Patient was started on ciprofloxacin and Flagyl for outpatient infectious enteritis.   Patient diet was advanced to regular.  She tolerated well without issue.  No nausea vomiting or abdominal pain noted.  Patient stable for discharge home at this time.  Long discussion with patient.  She has a lengthy medical and surgical history.  I encouraged her to pay attention to her symptoms closely at home and if she were to feel the onset of recurrent enteritis or have any acute change in her bowel habits she should contact her primary care physician.  She agreed to do so.  All questions were answered.  Patient discharged home in stable condition.  Discharge Diagnoses:  Active Problems:   Abdominal pain  Acute enteritis Abdominal pain Intractable nausea vomiting Diarrhea Partial SBO Suspect the symptoms are related to acute enteritis Exam not consistent with surgical abdomen at this time Patient hemodynamically stable with out elevated white count Patient started on ciprofloxacin and metronidazole IV fluid resuscitation Clear liquid diet for day 1, advance to regular on day 2 Tolerating well without issue Stable for discharge home Follow-up with PCP in 1 week  Paroxysmal atrial fibrillation, controlled ventricular response Patient is rate controlled No chest pain Plan: Continue Toprol tartrate 75 mg twice daily per home dose Continue home Xarelto  Hypertension Continue metoprolol Continue home Avapro  Discharge Instructions  Discharge Instructions    Diet - low sodium heart healthy   Complete by: As directed    Increase activity slowly   Complete by: As directed      Allergies as of 09/05/2019      Reactions   Pravastatin Other (See Comments)   Muscle aches      Medication List    TAKE these medications   acetaminophen 500 MG tablet Commonly known as: TYLENOL Take 500-1,000 mg by mouth every 6 (six) hours as needed for mild pain or  fever.   chlorthalidone 50 MG tablet Commonly known as: HYGROTON Take 25 mg by mouth daily as needed (fluid retention).    ciprofloxacin 500 MG tablet Commonly known as: Cipro Take 1 tablet (500 mg total) by mouth 2 (two) times daily for 6 days.   cyanocobalamin 1000 MCG/ML injection Commonly known as: (VITAMIN B-12) Inject 1,000 mcg into the muscle every 6 (six) weeks.   fluocinonide 0.05 % external solution Commonly known as: LIDEX Apply 1 application topically 2 (two) times daily as needed (itching).   furosemide 20 MG tablet Commonly known as: LASIX Take 20 mg by mouth daily.   glucosamine-chondroitin 500-400 MG tablet Take 1 tablet by mouth daily.   metoprolol tartrate 25 MG tablet Commonly known as: LOPRESSOR Take 75 mg by mouth 2 (two) times daily.   metroNIDAZOLE 500 MG tablet Commonly known as: Flagyl Take 1 tablet (500 mg total) by mouth 3 (three) times daily for 6 days.   ondansetron 4 MG tablet Commonly known as: ZOFRAN Take 1 tablet (4 mg total) by mouth every 6 (six) hours as needed for nausea. What changed: Another medication with the same name was added. Make sure you understand how and when to take each.   ondansetron 4 MG tablet Commonly known as: Zofran Take 1 tablet (4 mg total) by mouth daily as needed for nausea or vomiting. What changed: You were already taking a medication with the same name, and this prescription was added. Make sure you understand how and when to take each.   rivaroxaban 20 MG Tabs tablet Commonly known as: XARELTO Take 20 mg by mouth daily with supper.   spironolactone 25 MG tablet Commonly known as: ALDACTONE Take 25 mg by mouth daily.   telmisartan 80 MG tablet Commonly known as: MICARDIS Take 80 mg by mouth daily.      Follow-up Information    Gauger, Victoriano Lain, NP Follow up in 1 week(s).   Specialty: Internal Medicine Contact information: [place] [address] [phone]          Allergies  Allergen Reactions  . Pravastatin Other (See Comments)    Muscle aches     Consultations:  None   Procedures/Studies: CT Abdomen Pelvis W Contrast  Result Date: 09/04/2019 CLINICAL DATA:  Upper abdominal pain radiating bilaterally. The patient reports the pain is similar to when she had a previous bowel obstruction. EXAM: CT ABDOMEN AND PELVIS WITH CONTRAST TECHNIQUE: Multidetector CT imaging of the abdomen and pelvis was performed using the standard protocol following bolus administration of intravenous contrast. CONTRAST:  160mL OMNIPAQUE IOHEXOL 300 MG/ML  SOLN COMPARISON:  07/17/2019 and 01/10/2019 FINDINGS: Lower chest: Stable enlarged heart and dense coronary artery calcifications. Minimal atelectasis/scarring in the anterior left lower lobe laterally. Hepatobiliary: Normal appearing liver. Gallstones in the dependent portion of the gallbladder measuring up to 1.4 cm in diameter each. No gallbladder wall thickening or pericholecystic fluid. Pancreas: Unremarkable. No pancreatic ductal dilatation or surrounding inflammatory changes. Spleen: Normal in size without focal abnormality. Adrenals/Urinary Tract: Stable bilateral adrenal masses. The mass on the right measures 3.0 x 1.7 cm on image number 23 series 2. This was previously shown to be stable over multiple years. The larger mass on the left measures 1.9 x 1.4 cm on image number 22 series 2 and the smaller mass on the left measures 1.4 x 1.2 cm on image number 20 series 2. Unremarkable kidneys, ureters and urinary bladder. Stomach/Bowel: Mild dilatation of multiple small bowel loops in  the abdomen and pelvis with fecalization of contents in the left pelvis, without significant change. There is gradual transition to distal ileum with interval mild-to-moderate diffuse low density wall thickening, including the terminal ileum. The colon remains normal in caliber. Multiple sigmoid colon diverticula are noted. Again demonstrated are transverse colon anastomoses and adjacent surgical clips. Unremarkable stomach.  Vascular/Lymphatic: Atheromatous arterial calcifications without aneurysm. No enlarged lymph nodes. Reproductive: Status post hysterectomy. No adnexal masses. Other: Small amount of free peritoneal fluid in the pelvis with mild progression. No free peritoneal air. Small umbilical hernia containing fat. Musculoskeletal: Lumbar and lower thoracic spine degenerative changes and mild levoconvex lumbar scoliosis. These include facet degenerative changes with associated grade 1 anterolisthesis at the L3-4 level, without significant change. IMPRESSION: 1. Interval mild to moderate diffuse low density wall thickening including the terminal ileum, compatible with infectious or inflammatory enteritis. 2. Stable partial small bowel obstruction with a gradual transition to more normal caliber distal ileum. This may be due to the currently demonstrated changes of distal ileitis. A chronic partially obstructing adhesion is also a possibility. 3. Sigmoid diverticulosis. 4. Cholelithiasis without evidence of cholecystitis. 5. Stable bilateral adrenal adenomas. 6. Stable cardiomegaly and dense coronary artery calcifications. 7. Small amount of free peritoneal fluid in the pelvis with mild progression. Electronically Signed   By: Claudie Revering M.D.   On: 09/04/2019 12:50    (Echo, Carotid, EGD, Colonoscopy, ERCP)    Subjective: Patient seen and examined No acute events over interval Abdominal pain improved Tolerating p.o. intake  Discharge Exam: Vitals:   09/05/19 0906 09/05/19 1147  BP: (!) 109/56 (!) 107/59  Pulse: 81 64  Resp:  16  Temp:  98.6 F (37 C)  SpO2:  (!) 85%   Vitals:   09/04/19 2110 09/05/19 0424 09/05/19 0906 09/05/19 1147  BP: 121/74 106/63 (!) 109/56 (!) 107/59  Pulse: 100 94 81 64  Resp:  20  16  Temp:  98.7 F (37.1 C)  98.6 F (37 C)  TempSrc:  Oral  Oral  SpO2: 95% 96%  (!) 85%  Weight:      Height:        General: Pt is alert, awake, not in acute distress Cardiovascular: RRR,  S1/S2 +, no rubs, no gallops Respiratory: CTA bilaterally, no wheezing, no rhonchi Abdominal: Soft, NT, ND, bowel sounds + Extremities: no edema, no cyanosis    The results of significant diagnostics from this hospitalization (including imaging, microbiology, ancillary and laboratory) are listed below for reference.     Microbiology: Recent Results (from the past 240 hour(s))  SARS CORONAVIRUS 2 (TAT 6-24 HRS) Nasopharyngeal Nasopharyngeal Swab     Status: None   Collection Time: 09/04/19  2:52 PM   Specimen: Nasopharyngeal Swab  Result Value Ref Range Status   SARS Coronavirus 2 NEGATIVE NEGATIVE Final    Comment: (NOTE) SARS-CoV-2 target nucleic acids are NOT DETECTED. The SARS-CoV-2 RNA is generally detectable in upper and lower respiratory specimens during the acute phase of infection. Negative results do not preclude SARS-CoV-2 infection, do not rule out co-infections with other pathogens, and should not be used as the sole basis for treatment or other patient management decisions. Negative results must be combined with clinical observations, patient history, and epidemiological information. The expected result is Negative. Fact Sheet for Patients: SugarRoll.be Fact Sheet for Healthcare Providers: https://www.woods-mathews.com/ This test is not yet approved or cleared by the [place] FDA and  has been authorized for detection and/or diagnosis of SARS-CoV-2 by  FDA under an Emergency Use Authorization (EUA). This EUA will remain  in effect (meaning this test can be used) for the duration of the COVID-19 declaration under Section 56 4(b)(1) of the Act, 21 U.S.C. section 360bbb-3(b)(1), unless the authorization is terminated or revoked sooner. Performed at [facility], [place] [address] [place], [place] [address]      Labs: BNP (last 3 results) No results for input(s): BNP in the last 8760 hours. Basic Metabolic  Panel: Recent Labs  Lab 09/04/19 1002 09/05/19 0346  NA 135 135  K 5.2* 4.3  CL 99 103  CO2 28 26  GLUCOSE 125* 101*  BUN 19 15  CREATININE 1.02* 1.02*  CALCIUM 9.5 8.2*   Liver Function Tests: Recent Labs  Lab 09/04/19 1002 09/05/19 0346  AST 16 15  ALT 15 11  ALKPHOS 72 52  BILITOT 1.7* 1.4*  PROT 8.0 6.1*  ALBUMIN 4.6 3.4*   Recent Labs  Lab 09/04/19 1002  LIPASE 23   No results for input(s): AMMONIA in the last 168 hours. CBC: Recent Labs  Lab 09/04/19 1002 09/05/19 0346  WBC 7.8 7.6  HGB 17.1* 14.2  HCT 49.6* 42.8  MCV 97.3 99.8  PLT 148* 132*   Cardiac Enzymes: No results for input(s): CKTOTAL, CKMB, CKMBINDEX, TROPONINI in the last 168 hours. BNP: Invalid input(s): POCBNP CBG: No results for input(s): GLUCAP in the last 168 hours. D-Dimer No results for input(s): DDIMER in the last 72 hours. Hgb A1c No results for input(s): HGBA1C in the last 72 hours. Lipid Profile No results for input(s): CHOL, HDL, LDLCALC, TRIG, CHOLHDL, LDLDIRECT in the last 72 hours. Thyroid function studies No results for input(s): TSH, T4TOTAL, T3FREE, THYROIDAB in the last 72 hours.  Invalid input(s): FREET3 Anemia work up No results for input(s): VITAMINB12, FOLATE, FERRITIN, TIBC, IRON, RETICCTPCT in the last 72 hours. Urinalysis    Component Value Date/Time   COLORURINE YELLOW (A) 09/04/2019 1002   APPEARANCEUR HAZY (A) 09/04/2019 1002   APPEARANCEUR Hazy 10/20/2011 0720   LABSPEC 1.015 09/04/2019 1002   LABSPEC 1.008 10/20/2011 0720   PHURINE 5.0 09/04/2019 1002   GLUCOSEU NEGATIVE 09/04/2019 1002   GLUCOSEU Negative 10/20/2011 0720   HGBUR SMALL (A) 09/04/2019 1002   BILIRUBINUR NEGATIVE 09/04/2019 1002   BILIRUBINUR Negative 10/20/2011 0720   KETONESUR NEGATIVE 09/04/2019 1002   PROTEINUR NEGATIVE 09/04/2019 1002   NITRITE NEGATIVE 09/04/2019 1002   LEUKOCYTESUR MODERATE (A) 09/04/2019 1002   LEUKOCYTESUR 3+ 10/20/2011 0720   Sepsis Labs Invalid  input(s): PROCALCITONIN,  WBC,  LACTICIDVEN Microbiology Recent Results (from the past 240 hour(s))  SARS CORONAVIRUS 2 (TAT 6-24 HRS) Nasopharyngeal Nasopharyngeal Swab     Status: None   Collection Time: 09/04/19  2:52 PM   Specimen: Nasopharyngeal Swab  Result Value Ref Range Status   SARS Coronavirus 2 NEGATIVE NEGATIVE Final    Comment: (NOTE) SARS-CoV-2 target nucleic acids are NOT DETECTED. The SARS-CoV-2 RNA is generally detectable in upper and lower respiratory specimens during the acute phase of infection. Negative results do not preclude SARS-CoV-2 infection, do not rule out co-infections with other pathogens, and should not be used as the sole basis for treatment or other patient management decisions. Negative results must be combined with clinical observations, patient history, and epidemiological information. The expected result is Negative. Fact Sheet for Patients: SugarRoll.be Fact Sheet for Healthcare Providers: https://www.woods-mathews.com/ This test is not yet approved or cleared by the [place] and  has been authorized  for detection and/or diagnosis of SARS-CoV-2 by FDA under an Emergency Use Authorization (EUA). This EUA will remain  in effect (meaning this test can be used) for the duration of the COVID-19 declaration under Section 56 4(b)(1) of the Act, 21 U.S.C. section 360bbb-3(b)(1), unless the authorization is terminated or revoked sooner. Performed at [facility], [place] [address], [place], [place] [address]      Time coordinating discharge: Over 30 minutes  SIGNED:   Sidney Ace, MD  [facility] 09/05/2019, 2:51 PM Pager   If 7PM-7AM, please contact night-coverage

## 2019-09-05 NOTE — Care Management Obs Status (Signed)
[facility] NOTIFICATION   Patient Details  Name: Tiffany Shaffer MRN: [id number] Date of Birth: 10/28/1931   Medicare Observation Status Notification Given:  Yes    Shelbie Ammons, RN 09/05/2019, 2:19 PM

## 2019-09-05 NOTE — Plan of Care (Signed)
Discharge instructions given to patient who verbalized understanding of instructions, medication, and follow-up.  Discharged in stable condition and all belongings.

## 2019-09-05 NOTE — Plan of Care (Signed)
Continuing with plan of care. 

## 2020-01-07 DIAGNOSIS — R600 Localized edema: Secondary | ICD-10-CM | POA: Insufficient documentation

## 2020-02-17 DIAGNOSIS — G4733 Obstructive sleep apnea (adult) (pediatric): Secondary | ICD-10-CM | POA: Insufficient documentation

## 2020-06-22 ENCOUNTER — Other Ambulatory Visit: Payer: Self-pay | Admitting: Nurse Practitioner

## 2020-06-22 DIAGNOSIS — Z1231 Encounter for screening mammogram for malignant neoplasm of breast: Secondary | ICD-10-CM

## 2020-06-23 ENCOUNTER — Other Ambulatory Visit: Payer: Self-pay

## 2020-06-23 ENCOUNTER — Encounter (INDEPENDENT_AMBULATORY_CARE_PROVIDER_SITE_OTHER): Payer: Self-pay | Admitting: Nurse Practitioner

## 2020-06-23 ENCOUNTER — Ambulatory Visit (INDEPENDENT_AMBULATORY_CARE_PROVIDER_SITE_OTHER): Payer: Medicare Other | Admitting: Nurse Practitioner

## 2020-06-23 ENCOUNTER — Ambulatory Visit (INDEPENDENT_AMBULATORY_CARE_PROVIDER_SITE_OTHER): Payer: Medicare Other

## 2020-06-23 VITALS — BP 182/101 | HR 88 | Resp 16 | Ht 65.0 in | Wt 179.0 lb

## 2020-06-23 DIAGNOSIS — N183 Chronic kidney disease, stage 3 unspecified: Secondary | ICD-10-CM | POA: Insufficient documentation

## 2020-06-23 DIAGNOSIS — E782 Mixed hyperlipidemia: Secondary | ICD-10-CM | POA: Diagnosis not present

## 2020-06-23 DIAGNOSIS — I739 Peripheral vascular disease, unspecified: Secondary | ICD-10-CM

## 2020-06-23 DIAGNOSIS — I10 Essential (primary) hypertension: Secondary | ICD-10-CM

## 2020-06-23 DIAGNOSIS — E669 Obesity, unspecified: Secondary | ICD-10-CM | POA: Insufficient documentation

## 2020-06-25 ENCOUNTER — Encounter (INDEPENDENT_AMBULATORY_CARE_PROVIDER_SITE_OTHER): Payer: Self-pay | Admitting: Nurse Practitioner

## 2020-06-25 NOTE — Progress Notes (Signed)
Subjective:    Patient ID: Tiffany Shaffer, female    DOB: 07-21-1931, 86 y.o.   MRN: [id number] Chief Complaint  Patient presents with  . Follow-up    ultrasound    The patient returns to the office for followup and review of the noninvasive studies. There have been no interval changes in lower extremity symptoms. No interval shortening of the patient's claudication distance or development of rest pain symptoms. No new ulcers or wounds have occurred since the last visit.  There have been no significant changes to the patient's overall health care.  The patient denies amaurosis fugax or recent TIA symptoms. There are no recent neurological changes noted. The patient denies history of DVT, PE or superficial thrombophlebitis. The patient denies recent episodes of angina or shortness of breath.   ABI Rt=1.27 and Lt=1.06  (previous ABI's Rt=[place] and Lt=1.03) Duplex ultrasound of the right tibial arteries reveals triphasic/monophasic waveforms with triphasic tibial artery waveforms in the left lower extremity.  Good toe waveforms bilaterally.   Review of Systems  Musculoskeletal: Positive for arthralgias and gait problem.  All other systems reviewed and are negative.      Objective:   Physical Exam Vitals reviewed.  HENT:     Head: Normocephalic.  Cardiovascular:     Rate and Rhythm: Normal rate.     Pulses: Normal pulses.  Pulmonary:     Effort: Pulmonary effort is normal.  Neurological:     Mental Status: She is alert and oriented to person, place, and time.  Psychiatric:        Mood and Affect: Mood normal.        Behavior: Behavior normal.        Thought Content: Thought content normal.        Judgment: Judgment normal.     BP (!) 182/101 (BP Location: Right Arm)   Pulse 88   Resp 16   Ht 5\' 5"  (1.651 m)   Wt 179 lb (81.2 kg)   BMI 29.79 kg/m   Past Medical History:  Diagnosis Date  . Atrial fibrillation ([place])   . Cancer ([place]) 08/29/2016   squamous cell-on rt  breast- removed  . Headache    behind right eye  . History of blood clots 07/10/2013   Left lower leg  . PVD (peripheral vascular disease) ([place])   . SVT (supraventricular tachycardia) ([place])   . Wears dentures    full upper, p[artial lower    Social History   Socioeconomic History  . Marital status: Widowed    Spouse name: Not on file  . Number of children: Not on file  . Years of education: Not on file  . Highest education level: Not on file  Occupational History  . Not on file  Tobacco Use  . Smoking status: Never Smoker  . Smokeless tobacco: Never Used  Vaping Use  . Vaping Use: Never used  Substance and Sexual Activity  . Alcohol use: Yes    Alcohol/week: 14.0 standard drinks    Types: 14 Shots of liquor per week  . Drug use: No  . Sexual activity: Not on file  Other Topics Concern  . Not on file  Social History Narrative  . Not on file   Social Determinants of Health   Financial Resource Strain: Not on file  Food Insecurity: Not on file  Transportation Needs: Not on file  Physical Activity: Not on file  Stress: Not on file  Social Connections: Not on file  Intimate Partner Violence: Not on file    Past Surgical History:  Procedure Laterality Date  . ABDOMINAL HYSTERECTOMY    . angiogram legs    . BREAST BIOPSY Left    neg  . BREAST BIOPSY Right    neg  . BREAST BIOPSY Left 07/01/2018   ribbon clip, [place] Bx, pending path   . BROW LIFT Bilateral 07/10/2017   Procedure: BLEPHAROPLASTY  UPPER EYELID W EXCESS SKIN;  Surgeon: Karle Starch, MD;  Location: [facility];  Service: Ophthalmology;  Laterality: Bilateral;  . cataracts    . COLON RESECTION    . HERNIA REPAIR    . PTOSIS REPAIR Bilateral 07/10/2017   Procedure: PTOSIS REPAIR RESECT EX;  Surgeon: Karle Starch, MD;  Location: [facility];  Service: Ophthalmology;  Laterality: Bilateral;    Family History  Problem Relation Age of Onset  . CVA Mother   . CAD Father   . Breast  cancer Neg Hx     Allergies  Allergen Reactions  . Pravastatin Other (See Comments)    Muscle aches    CBC Latest Ref Rng & Units 09/05/2019 09/04/2019 07/18/2019  WBC 4.0 - 10.5 K/uL 7.6 7.8 9.2  Hemoglobin 12.0 - 15.0 g/dL 14.2 17.1(H) 13.1  Hematocrit 36.0 - 46.0 % 42.8 49.6(H) 38.1  Platelets 150 - 400 K/uL 132(L) 148(L) 115(L)      CMP     Component Value Date/Time   NA 135 09/05/2019 0346   NA 135 (L) 07/11/2013 0432   K 4.3 09/05/2019 0346   K 3.3 (L) 07/11/2013 0432   CL 103 09/05/2019 0346   CL 98 07/11/2013 0432   CO2 26 09/05/2019 0346   CO2 33 (H) 07/11/2013 0432   GLUCOSE 101 (H) 09/05/2019 0346   GLUCOSE 116 (H) 07/11/2013 0432   BUN 15 09/05/2019 0346   BUN 14 07/11/2013 0432   CREATININE 1.02 (H) 09/05/2019 0346   CREATININE 0.91 07/11/2013 0432   CALCIUM 8.2 (L) 09/05/2019 0346   CALCIUM 8.4 (L) 07/11/2013 0432   PROT 6.1 (L) 09/05/2019 0346   PROT 6.0 (L) 07/11/2013 0432   ALBUMIN 3.4 (L) 09/05/2019 0346   ALBUMIN 3.0 (L) 07/11/2013 0432   AST 15 09/05/2019 0346   AST 8 (L) 07/11/2013 0432   ALT 11 09/05/2019 0346   ALT 13 07/11/2013 0432   ALKPHOS 52 09/05/2019 0346   ALKPHOS 66 07/11/2013 0432   BILITOT 1.4 (H) 09/05/2019 0346   BILITOT 0.7 07/11/2013 0432   GFRNONAA 50 (L) 09/05/2019 0346   GFRNONAA >60 07/11/2013 0432   GFRAA 58 (L) 09/05/2019 0346   GFRAA >60 07/11/2013 0432     No results found.     Assessment & Plan:   1. PVD (peripheral vascular disease) ([place])  Recommend:  The patient has evidence of atherosclerosis of the lower extremities with claudication.  The patient does not voice lifestyle limiting changes at this point in time.  Noninvasive studies do not suggest clinically significant change.  No invasive studies, angiography or surgery at this time The patient should continue walking and begin a more formal exercise program.  The patient should continue antiplatelet therapy and aggressive treatment of the lipid  abnormalities  No changes in the patient's medications at this time  The patient should continue wearing graduated compression socks 10-15 mmHg strength to control the mild edema.    2. Essential hypertension, benign Continue antihypertensive medications as already ordered, these medications have been reviewed and  there are no changes at this time.   3. Mixed hyperlipidemia Continue statin as ordered and reviewed, no changes at this time    Current Outpatient Medications on File Prior to Visit  Medication Sig Dispense Refill  . acetaminophen (TYLENOL) 500 MG tablet Take 500-1,000 mg by mouth every 6 (six) hours as needed for mild pain or fever.     . cyanocobalamin (,VITAMIN B-12,) 1000 MCG/ML injection Inject 1,000 mcg into the muscle every 6 (six) weeks.     . fluocinonide (LIDEX) 0.05 % external solution Apply 1 application topically 2 (two) times daily as needed (itching).     Marland Kitchen glucosamine-chondroitin 500-400 MG tablet Take 1 tablet by mouth daily.    . metoprolol tartrate (LOPRESSOR) 25 MG tablet Take 75 mg by mouth 2 (two) times daily.    . rivaroxaban (XARELTO) 20 MG TABS tablet Take 20 mg by mouth daily with supper.    . chlorthalidone (HYGROTON) 50 MG tablet Take 25 mg by mouth daily as needed (fluid retention).  (Patient not taking: Reported on 06/23/2020)    . furosemide (LASIX) 20 MG tablet Take 20 mg by mouth daily.     . ondansetron (ZOFRAN) 4 MG tablet Take 1 tablet (4 mg total) by mouth every 6 (six) hours as needed for nausea. (Patient not taking: Reported on 06/23/2020) 20 tablet 0  . ondansetron (ZOFRAN) 4 MG tablet Take 1 tablet (4 mg total) by mouth daily as needed for nausea or vomiting. (Patient not taking: Reported on 06/23/2020) 30 tablet 1  . spironolactone (ALDACTONE) 25 MG tablet Take 25 mg by mouth daily.     Marland Kitchen telmisartan (MICARDIS) 80 MG tablet Take 80 mg by mouth daily.      No current facility-administered medications on file prior to visit.     There are no Patient Instructions on file for this visit. No follow-ups on file.   Georgiana Spinner, NP

## 2020-07-13 ENCOUNTER — Inpatient Hospital Stay: Admission: RE | Admit: 2020-07-13 | Payer: Medicare Other | Source: Ambulatory Visit

## 2020-07-28 ENCOUNTER — Emergency Department: Payer: Medicare Other

## 2020-07-28 ENCOUNTER — Inpatient Hospital Stay
Admission: EM | Admit: 2020-07-28 | Discharge: 2020-07-31 | DRG: 390 | Disposition: A | Discharge status: Home / Self Care | Payer: Medicare Other | Attending: Internal Medicine | Admitting: Internal Medicine

## 2020-07-28 ENCOUNTER — Other Ambulatory Visit: Payer: Self-pay

## 2020-07-28 ENCOUNTER — Inpatient Hospital Stay: Payer: Medicare Other

## 2020-07-28 DIAGNOSIS — Z79899 Other long term (current) drug therapy: Secondary | ICD-10-CM

## 2020-07-28 DIAGNOSIS — Z20822 Contact with and (suspected) exposure to covid-19: Secondary | ICD-10-CM | POA: Diagnosis present

## 2020-07-28 DIAGNOSIS — I4891 Unspecified atrial fibrillation: Secondary | ICD-10-CM | POA: Diagnosis present

## 2020-07-28 DIAGNOSIS — Z9049 Acquired absence of other specified parts of digestive tract: Secondary | ICD-10-CM

## 2020-07-28 DIAGNOSIS — Z85828 Personal history of other malignant neoplasm of skin: Secondary | ICD-10-CM | POA: Diagnosis not present

## 2020-07-28 DIAGNOSIS — I10 Essential (primary) hypertension: Secondary | ICD-10-CM | POA: Diagnosis not present

## 2020-07-28 DIAGNOSIS — I4510 Unspecified right bundle-branch block: Secondary | ICD-10-CM | POA: Diagnosis present

## 2020-07-28 DIAGNOSIS — R519 Headache, unspecified: Secondary | ICD-10-CM | POA: Diagnosis present

## 2020-07-28 DIAGNOSIS — N183 Chronic kidney disease, stage 3 unspecified: Secondary | ICD-10-CM | POA: Diagnosis not present

## 2020-07-28 DIAGNOSIS — Z90710 Acquired absence of both cervix and uterus: Secondary | ICD-10-CM

## 2020-07-28 DIAGNOSIS — Z86718 Personal history of other venous thrombosis and embolism: Secondary | ICD-10-CM

## 2020-07-28 DIAGNOSIS — Z888 Allergy status to other drugs, medicaments and biological substances status: Secondary | ICD-10-CM

## 2020-07-28 DIAGNOSIS — I739 Peripheral vascular disease, unspecified: Secondary | ICD-10-CM | POA: Diagnosis present

## 2020-07-28 DIAGNOSIS — I700 Atherosclerosis of aorta: Secondary | ICD-10-CM | POA: Diagnosis present

## 2020-07-28 DIAGNOSIS — K8020 Calculus of gallbladder without cholecystitis without obstruction: Secondary | ICD-10-CM | POA: Diagnosis present

## 2020-07-28 DIAGNOSIS — K219 Gastro-esophageal reflux disease without esophagitis: Secondary | ICD-10-CM | POA: Diagnosis present

## 2020-07-28 DIAGNOSIS — I1310 Hypertensive heart and chronic kidney disease without heart failure, with stage 1 through stage 4 chronic kidney disease, or unspecified chronic kidney disease: Secondary | ICD-10-CM | POA: Diagnosis present

## 2020-07-28 DIAGNOSIS — K56600 Partial intestinal obstruction, unspecified as to cause: Secondary | ICD-10-CM | POA: Diagnosis present

## 2020-07-28 DIAGNOSIS — M419 Scoliosis, unspecified: Secondary | ICD-10-CM | POA: Diagnosis present

## 2020-07-28 DIAGNOSIS — K56609 Unspecified intestinal obstruction, unspecified as to partial versus complete obstruction: Secondary | ICD-10-CM | POA: Diagnosis present

## 2020-07-28 DIAGNOSIS — Z7901 Long term (current) use of anticoagulants: Secondary | ICD-10-CM | POA: Diagnosis not present

## 2020-07-28 LAB — URINALYSIS, ROUTINE W REFLEX MICROSCOPIC
Bilirubin Urine: NEGATIVE
Glucose, UA: 50 mg/dL — AB
Ketones, ur: NEGATIVE mg/dL
Nitrite: NEGATIVE
Protein, ur: 30 mg/dL — AB
Specific Gravity, Urine: >1.046 — ABNORMAL HIGH (ref 1.005–1.030)
Squamous Epithelial / HPF: >50 — ABNORMAL HIGH (ref 0–5)
pH: 7 (ref 5.0–8.0)

## 2020-07-28 LAB — COMPREHENSIVE METABOLIC PANEL
ALT: 13 U/L (ref 0–44)
AST: 22 U/L (ref 15–41)
Albumin: 4.2 g/dL (ref 3.5–5.0)
Alkaline Phosphatase: 71 U/L (ref 38–126)
Anion gap: 12 (ref 5–15)
BUN: 18 mg/dL (ref 8–23)
CO2: 29 mmol/L (ref 22–32)
Calcium: 9.4 mg/dL (ref 8.9–10.3)
Chloride: 94 mmol/L — ABNORMAL LOW (ref 98–111)
Creatinine, Ser: 0.89 mg/dL (ref 0.44–1.00)
GFR, Estimated: >60 mL/min (ref >60)
Glucose, Bld: 185 mg/dL — ABNORMAL HIGH (ref 70–99)
Potassium: 4.4 mmol/L (ref 3.5–5.1)
Sodium: 135 mmol/L (ref 135–145)
Total Bilirubin: 1.2 mg/dL (ref 0.3–1.2)
Total Protein: 7.7 g/dL (ref 6.5–8.1)

## 2020-07-28 LAB — CBC WITH DIFFERENTIAL/PLATELET
Abs Immature Granulocytes: 0.06 10*3/uL (ref 0.00–0.07)
Basophils Absolute: 0 10*3/uL (ref 0.0–0.1)
Basophils Relative: 0 %
Eosinophils Absolute: 0 10*3/uL (ref 0.0–0.5)
Eosinophils Relative: 0 %
HCT: 45.5 % (ref 36.0–46.0)
Hemoglobin: 16 g/dL — ABNORMAL HIGH (ref 12.0–15.0)
Immature Granulocytes: 1 %
Lymphocytes Relative: 8 %
Lymphs Abs: 0.8 10*3/uL (ref 0.7–4.0)
MCH: 32.7 pg (ref 26.0–34.0)
MCHC: 35.2 g/dL (ref 30.0–36.0)
MCV: 93 fL (ref 80.0–100.0)
Monocytes Absolute: 0.2 10*3/uL (ref 0.1–1.0)
Monocytes Relative: 2 %
Neutro Abs: 9.5 10*3/uL — ABNORMAL HIGH (ref 1.7–7.7)
Neutrophils Relative %: 89 %
Platelets: 154 10*3/uL (ref 150–400)
RBC: 4.89 MIL/uL (ref 3.87–5.11)
RDW: 12.7 % (ref 11.5–15.5)
WBC: 10.7 10*3/uL — ABNORMAL HIGH (ref 4.0–10.5)
nRBC: 0 % (ref 0.0–0.2)

## 2020-07-28 LAB — PROTIME-INR
INR: 4.4 (ref 0.8–1.2)
Prothrombin Time: 40.4 seconds — ABNORMAL HIGH (ref 11.4–15.2)

## 2020-07-28 LAB — BLOOD GAS, VENOUS
Acid-base deficit: 12.4 mmol/L — ABNORMAL HIGH (ref 0.0–2.0)
Bicarbonate: 12.6 mmol/L — ABNORMAL LOW (ref 20.0–28.0)
O2 Saturation: 50.1 %
Patient temperature: 37
pCO2, Ven: 25 mmHg — ABNORMAL LOW (ref 44.0–60.0)
pH, Ven: 7.31 (ref 7.250–7.430)
pO2, Ven: <31 mmHg — CL (ref 32.0–45.0)

## 2020-07-28 LAB — APTT: aPTT: 54 seconds — ABNORMAL HIGH (ref 24–36)

## 2020-07-28 LAB — HEPARIN LEVEL (UNFRACTIONATED): Heparin Unfractionated: 2.52 IU/mL — ABNORMAL HIGH (ref 0.30–0.70)

## 2020-07-28 LAB — SARS CORONAVIRUS 2 (TAT 6-24 HRS): SARS Coronavirus 2: NEGATIVE

## 2020-07-28 LAB — LIPASE, BLOOD: Lipase: 28 U/L (ref 11–51)

## 2020-07-28 MED ORDER — MORPHINE SULFATE (PF) 2 MG/ML IV SOLN
2.0000 mg | INTRAVENOUS | Status: DC | PRN
  Administered 2020-07-28 – 2020-07-29 (×5): 2 mg via INTRAVENOUS
  Filled 2020-07-28 (×5): qty 1

## 2020-07-28 MED ORDER — DILTIAZEM LOAD VIA INFUSION
10.0000 mg | Freq: Once | INTRAVENOUS | Status: AC
  Administered 2020-07-28: 10 mg via INTRAVENOUS
  Filled 2020-07-28: qty 10

## 2020-07-28 MED ORDER — ACETAMINOPHEN 650 MG RE SUPP
650.0000 mg | RECTAL | Status: DC | PRN
  Administered 2020-07-28 – 2020-07-29 (×2): 650 mg via RECTAL
  Filled 2020-07-28 (×3): qty 1

## 2020-07-28 MED ORDER — ONDANSETRON HCL 4 MG/2ML IJ SOLN: 4.0000 mg | Freq: Once | INTRAMUSCULAR | Status: DC

## 2020-07-28 MED ORDER — SODIUM CHLORIDE 0.9 % IV SOLN: INTRAVENOUS | Status: DC

## 2020-07-28 MED ORDER — ONDANSETRON HCL 4 MG/2ML IJ SOLN
INTRAMUSCULAR | Status: AC
  Administered 2020-07-28: 4 mg via INTRAVENOUS
  Filled 2020-07-28: qty 2

## 2020-07-28 MED ORDER — HEPARIN (PORCINE) 25000 UT/250ML-% IV SOLN
1000.0000 [IU]/h | INTRAVENOUS | Status: DC
  Administered 2020-07-28 – 2020-07-29 (×2): 1000 [IU]/h via INTRAVENOUS
  Filled 2020-07-28 (×2): qty 250

## 2020-07-28 MED ORDER — METOPROLOL TARTRATE 5 MG/5ML IV SOLN
5.0000 mg | Freq: Four times a day (QID) | INTRAVENOUS | Status: DC
  Administered 2020-07-28 – 2020-07-29 (×4): 5 mg via INTRAVENOUS
  Filled 2020-07-28 (×5): qty 5

## 2020-07-28 MED ORDER — DILTIAZEM HCL-DEXTROSE 125-5 MG/125ML-% IV SOLN (PREMIX): 5.0000 mg/h | INTRAVENOUS | Status: DC

## 2020-07-28 MED ORDER — IOHEXOL 300 MG/ML  SOLN
100.0000 mL | Freq: Once | INTRAMUSCULAR | Status: AC | PRN
  Administered 2020-07-28: 100 mL via INTRAVENOUS

## 2020-07-28 MED ORDER — DIATRIZOATE MEGLUMINE & SODIUM 66-10 % PO SOLN
90.0000 mL | Freq: Once | ORAL | Status: AC
  Administered 2020-07-28: 90 mL via NASOGASTRIC

## 2020-07-28 MED ORDER — PANTOPRAZOLE SODIUM 40 MG IV SOLR
40.0000 mg | INTRAVENOUS | Status: DC
  Administered 2020-07-28 – 2020-07-29 (×2): 40 mg via INTRAVENOUS
  Filled 2020-07-28 (×2): qty 40

## 2020-07-28 MED ORDER — ONDANSETRON HCL 4 MG/2ML IJ SOLN
4.0000 mg | Freq: Four times a day (QID) | INTRAMUSCULAR | Status: DC | PRN
  Administered 2020-07-28 (×2): 4 mg via INTRAVENOUS
  Filled 2020-07-28 (×2): qty 2

## 2020-07-28 MED ORDER — DILTIAZEM HCL-DEXTROSE 125-5 MG/125ML-% IV SOLN (PREMIX)
5.0000 mg/h | INTRAVENOUS | Status: DC
  Administered 2020-07-28: 5 mg/h via INTRAVENOUS
  Filled 2020-07-28 (×2): qty 125

## 2020-07-28 MED ORDER — ONDANSETRON HCL 4 MG/2ML IJ SOLN: 4.0000 mg | Freq: Once | INTRAMUSCULAR | Status: AC

## 2020-07-28 MED ORDER — PHENOL 1.4 % MT LIQD
1.0000 | OROMUCOSAL | Status: DC | PRN
  Administered 2020-07-29: 1 via OROMUCOSAL
  Filled 2020-07-28 (×2): qty 177

## 2020-07-28 MED ORDER — HEPARIN BOLUS VIA INFUSION
3700.0000 [IU] | Freq: Once | INTRAVENOUS | Status: DC
  Filled 2020-07-28: qty 3700

## 2020-07-28 MED ORDER — HEPARIN BOLUS VIA INFUSION
2000.0000 [IU] | Freq: Once | INTRAVENOUS | Status: AC
  Administered 2020-07-28: 2000 [IU] via INTRAVENOUS
  Filled 2020-07-28: qty 2000

## 2020-07-28 MED ORDER — ONDANSETRON HCL 4 MG PO TABS: 4.0000 mg | ORAL_TABLET | Freq: Four times a day (QID) | ORAL | Status: DC | PRN

## 2020-07-28 MED ORDER — FENTANYL CITRATE (PF) 100 MCG/2ML IJ SOLN
50.0000 ug | Freq: Once | INTRAMUSCULAR | Status: AC
  Administered 2020-07-28: 50 ug via INTRAVENOUS
  Filled 2020-07-28: qty 2

## 2020-07-28 MED ORDER — DILTIAZEM LOAD VIA INFUSION: 10.0000 mg | Freq: Once | INTRAVENOUS | Status: DC

## 2020-07-28 NOTE — ED Triage Notes (Signed)
Pt brought in by [facility] for N/V which started this morning. EMS reports pt was hypertensive in route with a systolic pressure in 680H. Pt given 4mg  odt zofran given in route. Pt reports hx of bowel obstruction and states this feels similar. Pt reports small BM this morning. Pt in afib on arrival and reports a hx of afib.

## 2020-07-28 NOTE — ED Provider Notes (Signed)
[facility] Emergency Department Provider Note  ____________________________________________   Event Date/Time   First MD Initiated Contact with Patient 07/28/20 [phone]     (approximate)  I have reviewed the triage vital signs and the nursing notes.   HISTORY  Chief Complaint Nausea    HPI Tiffany Shaffer is a 87 y.o. female with history of atrial fibrillation on Xarelto, previous bowel obstruction in 2020 and 2021 that were both medically managed, history of sigmoid colectomy in 2001 due to polyps who presents to the emergency department with diffuse crampy abdominal pain without radiation that started yesterday afternoon and now multiple episodes of nausea vomiting.  Last bowel movement was yesterday morning and was small.  She is concerned she has another bowel obstruction.  No fevers.  No chest pain or chest discomfort.  Has chronic shortness of breath which is unchanged.  Hypertensive and tachycardic with EMS.  Given 4 mg of ODT Zofran prior to arrival.    It appears her SBO in 2020 was managed conservatively and resolved with NG tube.  Her SBO in 2021 resolved spontaneously and did not require NG tube placement.     Past Medical History:  Diagnosis Date  . Atrial fibrillation ([place])   . Cancer ([place]) 08/29/2016   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  . Headache    behind right eye  . History of blood clots 07/10/2013   Left lower leg  . PVD (peripheral vascular disease) ([place])   . SVT (supraventricular tachycardia) ([place])   . Wears dentures    full upper, p[artial lower    Patient Active Problem List   Diagnosis Date Noted  . CKD (chronic kidney disease) stage 3, GFR 30-59 ml/min ([facility]) 06/23/2020  . Obesity (BMI 30-39.9) 06/23/2020  . OSA (obstructive sleep apnea) 02/17/2020  . Bilateral leg edema 01/07/2020  . Abdominal pain 09/04/2019  . LVH (left ventricular hypertrophy) due to hypertensive disease, without heart failure 10/01/2018  . Small  bowel obstruction ([place]) 09/13/2018  . Atherosclerosis of abdominal aorta ([place]) 08/19/2018  . Family history of colon cancer 06/29/2016  . Essential hypertension, benign 06/23/2016  . Atrial fibrillation ([place]) 06/23/2016  . PVD (peripheral vascular disease) ([place]) 06/23/2016  . IGT (impaired glucose tolerance) 10/25/2015  . GERD (gastroesophageal reflux disease) 02/17/2014  . Thromboembolism of proximal vein of left lower extremity ([place]) 02/17/2014  . Mixed hyperlipidemia 01/13/2014    Past Surgical History:  Procedure Laterality Date  . ABDOMINAL HYSTERECTOMY    . angiogram legs    . BREAST BIOPSY Left    neg  . BREAST BIOPSY Right    neg  . BREAST BIOPSY Left 07/01/2018   ribbon clip, [place] Bx, pending path   . BROW LIFT Bilateral 07/10/2017   Procedure: BLEPHAROPLASTY  UPPER EYELID W EXCESS SKIN;  Surgeon: Karle Starch, MD;  Location: [facility];  Service: Ophthalmology;  Laterality: Bilateral;  . cataracts    . COLON RESECTION    . HERNIA REPAIR    . PTOSIS REPAIR Bilateral 07/10/2017   Procedure: PTOSIS REPAIR RESECT EX;  Surgeon: Karle Starch, MD;  Location: [facility];  Service: Ophthalmology;  Laterality: Bilateral;    Prior to Admission medications   Medication Sig Start Date End Date Taking? Authorizing Provider  acetaminophen (TYLENOL) 500 MG tablet Take 500-1,000 mg by mouth every 6 (six) hours as needed for mild pain or fever.     [provider]  chlorthalidone (HYGROTON) 50 MG tablet Take  25 mg by mouth daily as needed (fluid retention).  Patient not taking: Reported on 06/23/2020    [provider]  cyanocobalamin (,VITAMIN B-12,) 1000 MCG/ML injection Inject 1,000 mcg into the muscle every 6 (six) weeks.  11/26/18   [provider]  fluocinonide (LIDEX) 0.05 % external solution Apply 1 application topically 2 (two) times daily as needed (itching).     [provider]  furosemide (LASIX) 20 MG tablet Take 20 mg by  mouth daily.  12/24/18 12/24/19  [provider]  glucosamine-chondroitin 500-400 MG tablet Take 1 tablet by mouth daily.    [provider]  metoprolol tartrate (LOPRESSOR) 25 MG tablet Take 75 mg by mouth 2 (two) times daily.    [provider]  ondansetron (ZOFRAN) 4 MG tablet Take 1 tablet (4 mg total) by mouth every 6 (six) hours as needed for nausea. Patient not taking: Reported on 06/23/2020 07/18/19   Lorella Nimrod, MD  ondansetron (ZOFRAN) 4 MG tablet Take 1 tablet (4 mg total) by mouth daily as needed for nausea or vomiting. Patient not taking: Reported on 06/23/2020 09/05/19 09/04/20  Sidney Ace, MD  rivaroxaban (XARELTO) 20 MG TABS tablet Take 20 mg by mouth daily with supper.    [provider]  spironolactone (ALDACTONE) 25 MG tablet Take 25 mg by mouth daily.  10/28/18 10/28/19  [provider]  telmisartan (MICARDIS) 80 MG tablet Take 80 mg by mouth daily.  04/10/19 04/09/20  [provider]    Allergies Pravastatin and Statins  Family History  Problem Relation Age of Onset  . CVA Mother   . CAD Father   . Breast cancer Neg Hx     Social History Social History   Tobacco Use  . Smoking status: Never Smoker  . Smokeless tobacco: Never Used  Vaping Use  . Vaping Use: Never used  Substance Use Topics  . Alcohol use: Yes    Alcohol/week: 14.0 standard drinks    Types: 14 Shots of liquor per week  . Drug use: No    Review of Systems Constitutional: No fever. Eyes: No visual changes. ENT: No sore throat. Cardiovascular: Denies chest pain. Respiratory: Denies new shortness of breath. Gastrointestinal: + nausea, vomiting.  No diarrhea. Genitourinary: Negative for dysuria. Musculoskeletal: Negative for back pain. Skin: Negative for rash. Neurological: Negative for focal weakness or numbness.  ____________________________________________   PHYSICAL EXAM:  VITAL SIGNS: ED Triage Vitals  Enc Vitals Group      BP 07/28/20 0511 (!) 162/124     Pulse Rate 07/28/20 0511 (!) 107     Resp 07/28/20 0511 17     Temp 07/28/20 0511 (!) 97.5 F (36.4 C)     Temp Source 07/28/20 0511 Oral     SpO2 07/28/20 0511 94 %     Weight 07/28/20 0513 180 lb (81.6 kg)     Height 07/28/20 0513 5\' 5"  (1.651 m)     Head Circumference --      Peak Flow --      Pain Score 07/28/20 0513 9     Pain Loc --      Pain Edu? --      Excl. in [place]? --    CONSTITUTIONAL: Alert and oriented and responds appropriately to questions.  Elderly.  Appears uncomfortable. HEAD: Normocephalic EYES: Conjunctivae clear, pupils appear equal, EOM appear intact ENT: normal nose; moist mucous membranes NECK: Supple, normal ROM CARD: Irregularly irregular and tachycardic; S1 and S2 appreciated; no  murmurs, no clicks, no rubs, no gallops RESP: Normal chest excursion without splinting or tachypnea; breath sounds clear and equal bilaterally; no wheezes, no rhonchi, no rales, no hypoxia or respiratory distress, speaking full sentences ABD/GI: Not significantly distended, no tympany or fluid wave, soft but diffusely tender throughout, hyperactive bowel sounds noted BACK: The back appears normal EXT: Normal ROM in all joints; no deformity noted, no edema; no cyanosis SKIN: Normal color for age and race; warm; no rash on exposed skin NEURO: Moves all extremities equally PSYCH: The patient's mood and manner are appropriate.  ____________________________________________   LABS (all labs ordered are listed, but only abnormal results are displayed)  Labs Reviewed  CBC WITH DIFFERENTIAL/PLATELET - Abnormal; Notable for the following components:      Result Value   WBC 10.7 (*)    Hemoglobin 16.0 (*)    Neutro Abs 9.5 (*)    All other components within normal limits  COMPREHENSIVE METABOLIC PANEL - Abnormal; Notable for the following components:   Chloride 94 (*)    Glucose, Bld 185 (*)    All other components within normal limits  SARS  CORONAVIRUS 2 (TAT 6-24 HRS)  LIPASE, BLOOD  URINALYSIS, ROUTINE W REFLEX MICROSCOPIC   ____________________________________________  EKG   EKG Interpretation  Date/Time:  Wednesday July 28 2020 05:13:04 EST Ventricular Rate:  111 PR Interval:    QRS Duration: 140 QT Interval:  368 QTC Calculation: 501 R Axis:   81 Text Interpretation: Atrial fibrillation Right bundle branch block No significant change since last tracing Confirmed by Pryor Curia [phone]) on 07/28/2020 5:27:19 AM       ____________________________________________  RADIOLOGY Jessie Foot Kriss Perleberg, personally viewed and evaluated these images (plain radiographs) as part of my medical decision making, as well as reviewing the written report by the radiologist.  ED MD interpretation: CT scan shows partial small bowel obstruction.  Official radiology report(s): CT ABDOMEN PELVIS W CONTRAST  Result Date: 07/28/2020 CLINICAL DATA:  Bowel obstruction suspected. Upper abdominal pain with nausea and vomiting EXAM: CT ABDOMEN AND PELVIS WITH CONTRAST TECHNIQUE: Multidetector CT imaging of the abdomen and pelvis was performed using the standard protocol following bolus administration of intravenous contrast. CONTRAST:  155mL OMNIPAQUE IOHEXOL 300 MG/ML  SOLN COMPARISON:  09/04/2019 FINDINGS: Lower chest: Large heart with coronary atherosclerotic calcification. Hepatobiliary: No focal liver abnormality.Cholelithiasis. No evidence of cholecystitis. Pancreas: Unremarkable. Spleen: Low-density lesion in the subcapsular upper spleen measuring 9 mm, stable and usually incidental, often hemangioma. Adrenals/Urinary Tract: Nodular bilateral adrenal gland thickening with unchanged shape and size, most consistent with adenomas. The right gland measures up to 27 x 19 mm. No hydronephrosis or stone. Tiny left renal cyst. Unremarkable bladder. Stomach/Bowel: Dilated small bowel loops with fecalized segment before a transition point to decompressed  ileal loops. Largely decompressed postoperative colon. No evidence of bowel perforation or necrosis. Mild left colonic diverticulosis. Vascular/Lymphatic: Extensive atheromatous calcification. No acute vascular finding. No mass or adenopathy. Reproductive:Hysterectomy Other: Trace reactive pelvic/lower abdominal ascites. Musculoskeletal: No acute abnormalities. Lumbar spine degeneration with scoliosis and L3-4 anterolisthesis. IMPRESSION: 1. Early/partial small bowel obstruction with transition point in the low central abdomen, similar to March 2021 occurrence. 2. Cholelithiasis without cholecystitis. 3.  Aortic Atherosclerosis (ICD10-I70.0). Electronically Signed   By: Monte Fantasia M.D.   On: 07/28/2020 06:57    ____________________________________________   PROCEDURES  Procedure(s) performed (including Critical Care):  Procedures  CRITICAL CARE Performed by: Cyril Mourning Teonia Yager   Total critical care time: 45 minutes  Critical care  time was exclusive of separately billable procedures and treating other patients.  Critical care was necessary to treat or prevent imminent or life-threatening deterioration.  Critical care was time spent personally by me on the following activities: development of treatment plan with patient and/or surrogate as well as nursing, discussions with consultants, evaluation of patient's response to treatment, examination of patient, obtaining history from patient or surrogate, ordering and performing treatments and interventions, ordering and review of laboratory studies, ordering and review of radiographic studies, pulse oximetry and re-evaluation of patient's condition.  ____________________________________________   INITIAL IMPRESSION / ASSESSMENT AND PLAN / ED COURSE  As part of my medical decision making, I reviewed the following data within the electronic MEDICAL RECORD NUMBER Notes from prior ED visits and  Controlled Substance Database         Patient here  with abdominal pain, vomiting.  She is concerned she has another bowel obstruction.  She is not significantly distended here but does have hypoactive bowel sounds and is actively dry heaving.  Will obtain CT of the abdomen pelvis, labs and urine.  I also am concerned about bowel obstruction.  Colitis, diverticulitis, appendicitis, UTI, pyelonephritis, kidney stone, cholecystitis, pancreatitis also on the differential.  Doubt perforation.  She is hypertensive and tachycardic here but suspect this is due to pain and vomiting.  States unable to keep medications down today and has not had her nightly medications for A. fib.  We will start her on IV fluids and IV diltiazem.  Pain and nausea medicine have been ordered.    5:40 AM  Pt has had significant improvement in heart rate and blood pressure with diltiazem bolus.  Will hold infusion at this time.  Heart rate currently in the 70s still in a fib.  7:00 AM  Pt's CT scan shows an early/partial small bowel obstruction with transition point in the low central abdomen similar to March 2021.  She has cholelithiasis without cholecystitis.  Will place NG tube, keep n.p.o. and continue IV hydration.  Patient's pain has been well controlled after fentanyl and heart rate is still in the sixties to seventies in atrial fibrillation after diltiazem bolus.  Will discuss with medicine for admission and general surgery for consultation.  Patient updated with plan.  Dilt gtt still on hold at this time.  7:19 AM Discussed patient's case with hospitalist, Dr. Francine Graven.  I have recommended admission and patient (and family if present) agree with this plan. Admitting physician will place admission orders.   I reviewed all nursing notes, vitals, pertinent previous records and reviewed/interpreted all EKGs, lab and urine results, imaging (as available).  7:20 AM  Spoke with Dr. Peyton Najjar with general surgery who will see patient in  consult.   ____________________________________________   FINAL CLINICAL IMPRESSION(S) / ED DIAGNOSES  Final diagnoses:  Atrial fibrillation with RVR ([place])  Partial small bowel obstruction [facility])     ED Discharge Orders    None      *Please note:  Tiffany Shaffer was evaluated in Emergency Department on 07/28/2020 for the symptoms described in the history of present illness. She was evaluated in the context of the global COVID-19 pandemic, which necessitated consideration that the patient might be at risk for infection with the SARS-CoV-2 virus that causes COVID-19. Institutional protocols and algorithms that pertain to the evaluation of patients at risk for COVID-19 are in a state of rapid change based on information released by regulatory bodies including the CDC and federal and state organizations. These  policies and algorithms were followed during the patient's care in the ED.  Some ED evaluations and interventions may be delayed as a result of limited staffing during and the pandemic.*   Note:  This document was prepared using Dragon voice recognition software and may include unintentional dictation errors.   Tanisa Lagace, Delice Bison, DO 07/28/20 [phone]

## 2020-07-28 NOTE — H&P (Addendum)
History and Physical    Tiffany Shaffer [id number] DOB: October 13, 1931 DOA: 07/28/2020  PCP: Sallee Lange, NP   Patient coming from: Home  I have personally briefly reviewed patient's old medical records in [facility]  Chief Complaint: Abdominal pain   HPI: Tiffany Shaffer is a 87 y.o. female with medical history significant for atrial fibrillation on chronic anticoagulation therapy with Xarelto, history of prior bowel obstruction that resolved with conservative management, status post sigmoid colectomy who presents to the ER via EMS for evaluation of abdominal pain which she describes as severe, intermittent, periumbilical pain rated an 8 x 10 in intensity at its worst, nonradiating and associated with nausea and multiple episodes of emesis.  Patient states that it feels like her prior episode of a bowel obstruction and was concerned she may be having another episode.  She had a small bowel movement prior to presenting to the ER. Patient was noted to be in rapid A. fib upon arrival to the ER and blood pressure was elevated with systolic blood pressure in the 200s. She denies having any chest pain, no palpitations, no diaphoresis, no shortness of breath, no headache, no fever, no chills, no dizziness, no lightheadedness, no diarrhea, no urinary frequency, no nocturia, no dysuria, no cough, no headache, no blurred vision, no lower extremity weakness Labs show sodium 135, potassium 4.4, chloride 94, bicarb 29, glucose 185, BUN 18, creatinine 0.89, calcium 9.4, anion gap 12, alkaline phosphatase 71, albumin 4.2, lipase 28, AST 22, ALT 13, total protein 7.7, white count 10.7, hemoglobin 16, hematocrit 45, MCV 93, RDW 12.7, platelet count 154 Urine analysis shows pyuria CT scan of abdomen and pelvis shows an elderly/partial small bowel obstruction with transition point in the lower central abdomen, similar to March 2021 occurrence.  Cholelithiasis without cholecystitis. Twelve-lead EKG  shows atrial fibrillation with a right bundle branch block   ED Course: Patient is an 86 year old Caucasian female who presents to the ER for evaluation of abdominal pain, nausea and vomiting and is found to have a partial small bowel obstruction.  She has had prior episodes of partial small bowel obstructions in the past.  Upon arrival to the ER she was noted to be in rapid A. fib and had elevated blood pressure.  She received Cardizem IV push and was started on a Cardizem drip with improvement in her heart rate. She will be admitted to the hospital for further evaluation.  Review of Systems: As per HPI otherwise all systems reviewed and negative.    Past Medical History:  Diagnosis Date  . Atrial fibrillation ([place])   . Cancer ([place]) 08/29/2016   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  . Headache    behind right eye  . History of blood clots 07/10/2013   Left lower leg  . PVD (peripheral vascular disease) ([place])   . SVT (supraventricular tachycardia) ([place])   . Wears dentures    full upper, p[artial lower    Past Surgical History:  Procedure Laterality Date  . ABDOMINAL HYSTERECTOMY    . angiogram legs    . BREAST BIOPSY Left    neg  . BREAST BIOPSY Right    neg  . BREAST BIOPSY Left 07/01/2018   ribbon clip, [place] Bx, pending path   . BROW LIFT Bilateral 07/10/2017   Procedure: BLEPHAROPLASTY  UPPER EYELID W EXCESS SKIN;  Surgeon: Karle Starch, MD;  Location: [facility];  Service: Ophthalmology;  Laterality: Bilateral;  . cataracts    .  COLON RESECTION    . HERNIA REPAIR    . PTOSIS REPAIR Bilateral 07/10/2017   Procedure: PTOSIS REPAIR RESECT EX;  Surgeon: Karle Starch, MD;  Location: [facility];  Service: Ophthalmology;  Laterality: Bilateral;     reports that she has never smoked. She has never used smokeless tobacco. She reports current alcohol use of about 14.0 standard drinks of alcohol per week. She reports that she does not use drugs.  Allergies   Allergen Reactions  . Pravastatin Other (See Comments)    Muscle aches  . Statins     Family History  Problem Relation Age of Onset  . CVA Mother   . CAD Father   . Breast cancer Neg Hx      Prior to Admission medications   Medication Sig Start Date End Date Taking? Authorizing Provider  acetaminophen (TYLENOL) 500 MG tablet Take 500-1,000 mg by mouth every 6 (six) hours as needed for mild pain or fever.     [provider]  chlorthalidone (HYGROTON) 50 MG tablet Take 25 mg by mouth daily as needed (fluid retention).  Patient not taking: Reported on 06/23/2020    [provider]  cyanocobalamin (,VITAMIN B-12,) 1000 MCG/ML injection Inject 1,000 mcg into the muscle every 6 (six) weeks.  11/26/18   [provider]  fluocinonide (LIDEX) 0.05 % external solution Apply 1 application topically 2 (two) times daily as needed (itching).     [provider]  furosemide (LASIX) 20 MG tablet Take 20 mg by mouth daily.  12/24/18 12/24/19  [provider]  glucosamine-chondroitin 500-400 MG tablet Take 1 tablet by mouth daily.    [provider]  metoprolol tartrate (LOPRESSOR) 25 MG tablet Take 75 mg by mouth 2 (two) times daily.    [provider]  ondansetron (ZOFRAN) 4 MG tablet Take 1 tablet (4 mg total) by mouth every 6 (six) hours as needed for nausea. Patient not taking: Reported on 06/23/2020 07/18/19   Lorella Nimrod, MD  ondansetron (ZOFRAN) 4 MG tablet Take 1 tablet (4 mg total) by mouth daily as needed for nausea or vomiting. Patient not taking: Reported on 06/23/2020 09/05/19 09/04/20  Sidney Ace, MD  rivaroxaban (XARELTO) 20 MG TABS tablet Take 20 mg by mouth daily with supper.    [provider]  spironolactone (ALDACTONE) 25 MG tablet Take 25 mg by mouth daily.  10/28/18 10/28/19  [provider]  telmisartan (MICARDIS) 80 MG tablet Take 80 mg by mouth daily.  04/10/19 04/09/20  [provider]     Physical Exam: Vitals:   07/28/20 0550 07/28/20 0600 07/28/20 0615 07/28/20 0723  BP: (!) 152/75 (!) 163/73 139/64 (!) 172/72  Pulse: 69 76 61 (!) 53  Resp: 17 15 18  (!) 28  Temp:      TempSrc:      SpO2: 97% 99% 94% 97%  Weight:      Height:         Vitals:   07/28/20 0550 07/28/20 0600 07/28/20 0615 07/28/20 0723  BP: (!) 152/75 (!) 163/73 139/64 (!) 172/72  Pulse: 69 76 61 (!) 53  Resp: 17 15 18  (!) 28  Temp:      TempSrc:      SpO2: 97% 99% 94% 97%  Weight:      Height:        Constitutional: NAD, alert and oriented x 3.  Appears comfortable and in no distress Eyes: PERRL, lids and conjunctivae normal ENMT:  Mucous membranes are moist.  Neck: normal, supple, no masses, no thyromegaly Respiratory: clear to auscultation bilaterally, no wheezing, no crackles. Normal respiratory effort. No accessory muscle use.  Cardiovascular: Irregularly irregular, no murmurs / rubs / gallops. No extremity edema. 2+ pedal pulses. No carotid bruits.  Abdomen: Periumbilical tenderness, no masses palpated. No hepatosplenomegaly. Bowel sounds are hyperactive  Musculoskeletal: no clubbing / cyanosis. No joint deformity upper and lower extremities.  Skin: no rashes, lesions, ulcers.  Neurologic: No gross focal neurologic deficit. Psychiatric: Normal mood and affect.   Labs on Admission: I have personally reviewed following labs and imaging studies  CBC: Recent Labs  Lab 07/28/20 0518  WBC 10.7*  NEUTROABS 9.5*  HGB 16.0*  HCT 45.5  MCV 93.0  PLT 371   Basic Metabolic Panel: Recent Labs  Lab 07/28/20 0518  NA 135  K 4.4  CL 94*  CO2 29  GLUCOSE 185*  BUN 18  CREATININE 0.89  CALCIUM 9.4   GFR: Estimated Creatinine Clearance: 47.8 mL/min (by C-G formula based on SCr of 0.89 mg/dL). Liver Function Tests: Recent Labs  Lab 07/28/20 0518  AST 22  ALT 13  ALKPHOS 71  BILITOT 1.2  PROT 7.7  ALBUMIN 4.2   Recent Labs  Lab 07/28/20 0518  LIPASE 28   No results  for input(s): AMMONIA in the last 168 hours. Coagulation Profile: No results for input(s): INR, PROTIME in the last 168 hours. Cardiac Enzymes: No results for input(s): CKTOTAL, CKMB, CKMBINDEX, TROPONINI in the last 168 hours. BNP (last 3 results) No results for input(s): PROBNP in the last 8760 hours. HbA1C: No results for input(s): HGBA1C in the last 72 hours. CBG: No results for input(s): GLUCAP in the last 168 hours. Lipid Profile: No results for input(s): CHOL, HDL, LDLCALC, TRIG, CHOLHDL, LDLDIRECT in the last 72 hours. Thyroid Function Tests: No results for input(s): TSH, T4TOTAL, FREET4, T3FREE, THYROIDAB in the last 72 hours. Anemia Panel: No results for input(s): VITAMINB12, FOLATE, FERRITIN, TIBC, IRON, RETICCTPCT in the last 72 hours. Urine analysis:    Component Value Date/Time   COLORURINE YELLOW (A) 07/28/2020 0722   APPEARANCEUR CLOUDY (A) 07/28/2020 0722   APPEARANCEUR Hazy 10/20/2011 0720   LABSPEC >1.046 (H) 07/28/2020 0722   LABSPEC 1.008 10/20/2011 0720   PHURINE 7.0 07/28/2020 0722   GLUCOSEU 50 (A) 07/28/2020 0722   GLUCOSEU Negative 10/20/2011 0720   HGBUR SMALL (A) 07/28/2020 0722   BILIRUBINUR NEGATIVE 07/28/2020 0722   BILIRUBINUR Negative 10/20/2011 0720   KETONESUR NEGATIVE 07/28/2020 0722   PROTEINUR 30 (A) 07/28/2020 0722   NITRITE NEGATIVE 07/28/2020 0722   LEUKOCYTESUR LARGE (A) 07/28/2020 0722   LEUKOCYTESUR 3+ 10/20/2011 0720    Radiological Exams on Admission: CT ABDOMEN PELVIS W CONTRAST  Result Date: 07/28/2020 CLINICAL DATA:  Bowel obstruction suspected. Upper abdominal pain with nausea and vomiting EXAM: CT ABDOMEN AND PELVIS WITH CONTRAST TECHNIQUE: Multidetector CT imaging of the abdomen and pelvis was performed using the standard protocol following bolus administration of intravenous contrast. CONTRAST:  16mL OMNIPAQUE IOHEXOL 300 MG/ML  SOLN COMPARISON:  09/04/2019 FINDINGS: Lower chest: Large heart with coronary atherosclerotic  calcification. Hepatobiliary: No focal liver abnormality.Cholelithiasis. No evidence of cholecystitis. Pancreas: Unremarkable. Spleen: Low-density lesion in the subcapsular upper spleen measuring 9 mm, stable and usually incidental, often hemangioma. Adrenals/Urinary Tract: Nodular bilateral adrenal gland thickening with unchanged shape and size, most consistent with adenomas. The right gland measures up to 27 x 19 mm. No hydronephrosis or stone. Tiny left renal  cyst. Unremarkable bladder. Stomach/Bowel: Dilated small bowel loops with fecalized segment before a transition point to decompressed ileal loops. Largely decompressed postoperative colon. No evidence of bowel perforation or necrosis. Mild left colonic diverticulosis. Vascular/Lymphatic: Extensive atheromatous calcification. No acute vascular finding. No mass or adenopathy. Reproductive:Hysterectomy Other: Trace reactive pelvic/lower abdominal ascites. Musculoskeletal: No acute abnormalities. Lumbar spine degeneration with scoliosis and L3-4 anterolisthesis. IMPRESSION: 1. Early/partial small bowel obstruction with transition point in the low central abdomen, similar to March 2021 occurrence. 2. Cholelithiasis without cholecystitis. 3.  Aortic Atherosclerosis (ICD10-I70.0). Electronically Signed   By: Monte Fantasia M.D.   On: 07/28/2020 06:57    EKG: Independently reviewed.  Atrial fibrillation with a rapid ventricular rate Right bundle branch block  Assessment/Plan Principal Problem:   Atrial fibrillation with rapid ventricular response (HCC) Active Problems:   Essential hypertension, benign   Small bowel obstruction (HCC)   CKD (chronic kidney disease) stage 3, GFR 30-59 ml/min (HCC)   GERD (gastroesophageal reflux disease)      Atrial fibrillation with rapid ventricular rate Patient has a history of atrial fibrillation and presented to the ER with a rapid ventricular rate She received a dose of IV Cardizem with improvement in her  heart rate Will place patient on scheduled metoprolol 5 mg IV every 6 Hold Xarelto for now and start patient on heparin drip per protocol    Partial small bowel obstruction Most likely secondary to adhesions Patient has had 2 prior episodes Conservative management with nasogastric decompression of the stomach, IV fluid hydration, pain control, antiemetics and IV PPI We will request surgical consult   GERD Continue IV PPI   DVT prophylaxis: Heparin Code Status: Full code Family Communication: Greater than 50% of time was spent discussing plan of care with patient at the bedside.  All questions and concerns have been addressed.  She verbalizes understanding and agrees with. Disposition Plan: Back to previous home environment Consults called: Surgery    Tascha Casares MD [facility]     07/28/2020, 9:23 AM

## 2020-07-28 NOTE — ED Notes (Signed)
Dr. Francine Graven notified of pts INR of 4.4 via secure text.

## 2020-07-28 NOTE — Consult Note (Addendum)
ANTICOAGULATION CONSULT NOTE  Pharmacy Consult for Heparin  Indication: atrial fibrillation  Patient Measurements: Heparin Dosing Weight: 74.4kg  Labs: Recent Labs    07/28/20 0518  HGB 16.0*  HCT 45.5  PLT 154  CREATININE 0.89    Estimated Creatinine Clearance: 47.8 mL/min (by C-G formula based on SCr of 0.89 mg/dL).   Medical History: Past Medical History:  Diagnosis Date   Atrial fibrillation ([place])    Cancer ([place]) 08/29/2016   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   Headache    behind right eye   History of blood clots 07/10/2013   Left lower leg   PVD (peripheral vascular disease) ([facility])    SVT (supraventricular tachycardia) ([facility])    Wears dentures    full upper, p[artial lower    Medications:  Xarelto 20mg  PO QPM. Last dose was 2/1 at 18:30.  Assessment:  87yo female who reported to the ED w/ complaint of abdominal pain. She has a pmh of atrial fibrillation on chronic anticoagulation therapy with Xarelto, hx of prior bowel obstructions, and s/p sigmoid colectomy. Upon arrival to the ED pt was in rapid afib w/ elevated SBP in the 200's. Theres been concern for small bowel obstruction, and pt is on bowel rest w/ NG tube for decompression. Given pt is NPO and unable to take home Xarelto at this time, pharmacy has been consulted for heparin dosing.   Hgb is 16.0, and platelets 154. Baseline HL 2.52, PT 40.4 INR 4.4, and aPTT 54. LFT's are wnl. Suspect Xarelto induced effect on baseline coagulation parameters.   Goal of Therapy:  Heparin level 0.3-0.7 units/ml aPTT 66-102 seconds Monitor platelets by anticoagulation protocol: Yes   Plan:  --Will initiate heparin when next dose of Xarelto is due (18:00)  --Due to elevated baseline HL, aPTT half bolus will be given heparin 2000 unit x1 followed by continuous infusion at 1000 units/hr  --Will obtain aPTT & HL 8 hours after initiation of infusion. Will monitor aPTT given Xarelto interference on HL. Can switch to HL  monitoring when levels correlate.  --Monitor H/H, platelets, and CBC daily.  Emogene Morgan, Pharmacy Student  07/28/2020,10:10 AM

## 2020-07-28 NOTE — Consult Note (Signed)
SURGICAL CONSULTATION NOTE   HISTORY OF PRESENT ILLNESS (HPI):  87 y.o. female presented to [facility] ED for evaluation of abdominal pain nausea and vomiting. Patient reports she has history of previous bowel obstruction and her usual abdominal pain nausea vomiting from previous bowel obstruction was similar at this time.  She endorses that she took an omeprazole and then start burping.  She had a bowel movement yesterday and she reported passing gas but she has not passed any gas since she was in the ED.  At the moment of my evaluation she reports feeling better from the nausea but she feels that is going to come back.  The pain is localized to the periumbilical area.  There is no pain radiation.  There is no alleviating or aggravating factors.  Of note patient has had previous episode of small bowel obstruction treated with conservative management and resolving in 1 or 2 days.  Last CT scan done on March with similar appearance.  At the ED she was found with white blood cell count of 10,000 and hemoglobin of 16,000.  These are most likely due to dehydration.  CT scan abdominal pelvis shows small bowel dilation with concerning of obstruction due to adhesions.  There is no free air or free fluid.  I personally evaluated the images.  Patient has history of A. fib on anticoagulation.  Surgery is consulted by Dr. Francine Graven in this context for evaluation and management of small bowel obstruction.  PAST MEDICAL HISTORY (PMH):  Past Medical History:  Diagnosis Date  . Atrial fibrillation ([place])   . Cancer ([place]) 08/29/2016   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  . Headache    behind right eye  . History of blood clots 07/10/2013   Left lower leg  . PVD (peripheral vascular disease) ([place])   . SVT (supraventricular tachycardia) ([place])   . Wears dentures    full upper, p[artial lower     PAST SURGICAL HISTORY ([place]):  Past Surgical History:  Procedure Laterality Date  . ABDOMINAL HYSTERECTOMY    .  angiogram legs    . BREAST BIOPSY Left    neg  . BREAST BIOPSY Right    neg  . BREAST BIOPSY Left 07/01/2018   ribbon clip, [place] Bx, pending path   . BROW LIFT Bilateral 07/10/2017   Procedure: BLEPHAROPLASTY  UPPER EYELID W EXCESS SKIN;  Surgeon: Karle Starch, MD;  Location: [place];  Service: Ophthalmology;  Laterality: Bilateral;  . cataracts    . COLON RESECTION    . HERNIA REPAIR    . PTOSIS REPAIR Bilateral 07/10/2017   Procedure: PTOSIS REPAIR RESECT EX;  Surgeon: Karle Starch, MD;  Location: [place];  Service: Ophthalmology;  Laterality: Bilateral;     MEDICATIONS:  Prior to Admission medications   Medication Sig Start Date End Date Taking? Authorizing Provider  acetaminophen (TYLENOL) 500 MG tablet Take 500-1,000 mg by mouth every 6 (six) hours as needed for mild pain or fever.     [provider]  chlorthalidone (HYGROTON) 50 MG tablet Take 25 mg by mouth daily as needed (fluid retention).  Patient not taking: Reported on 06/23/2020    [provider]  cyanocobalamin (,VITAMIN B-12,) 1000 MCG/ML injection Inject 1,000 mcg into the muscle every 6 (six) weeks.  11/26/18   [provider]  fluocinonide (LIDEX) 0.05 % external solution Apply 1 application topically 2 (two) times daily as needed (itching).     [provider]  furosemide (  LASIX) 20 MG tablet Take 20 mg by mouth daily.  12/24/18 12/24/19  [provider]  glucosamine-chondroitin 500-400 MG tablet Take 1 tablet by mouth daily.    [provider]  metoprolol tartrate (LOPRESSOR) 25 MG tablet Take 75 mg by mouth 2 (two) times daily.    [provider]  ondansetron (ZOFRAN) 4 MG tablet Take 1 tablet (4 mg total) by mouth every 6 (six) hours as needed for nausea. Patient not taking: Reported on 06/23/2020 07/18/19   Lorella Nimrod, MD  ondansetron (ZOFRAN) 4 MG tablet Take 1 tablet (4 mg total) by mouth daily as needed for nausea or  vomiting. Patient not taking: Reported on 06/23/2020 09/05/19 09/04/20  Sidney Ace, MD  rivaroxaban (XARELTO) 20 MG TABS tablet Take 20 mg by mouth daily with supper.    [provider]  spironolactone (ALDACTONE) 25 MG tablet Take 25 mg by mouth daily.  10/28/18 10/28/19  [provider]  telmisartan (MICARDIS) 80 MG tablet Take 80 mg by mouth daily.  04/10/19 04/09/20  [provider]     ALLERGIES:  Allergies  Allergen Reactions  . Pravastatin Other (See Comments)    Muscle aches  . Statins      SOCIAL HISTORY:  Social History   Socioeconomic History  . Marital status: Widowed    Spouse name: Not on file  . Number of children: Not on file  . Years of education: Not on file  . Highest education level: Not on file  Occupational History  . Not on file  Tobacco Use  . Smoking status: Never Smoker  . Smokeless tobacco: Never Used  Vaping Use  . Vaping Use: Never used  Substance and Sexual Activity  . Alcohol use: Yes    Alcohol/week: 14.0 standard drinks    Types: 14 Shots of liquor per week  . Drug use: No  . Sexual activity: Not on file  Other Topics Concern  . Not on file  Social History Narrative  . Not on file   Social Determinants of Health   Financial Resource Strain: Not on file  Food Insecurity: Not on file  Transportation Needs: Not on file  Physical Activity: Not on file  Stress: Not on file  Social Connections: Not on file  Intimate Partner Violence: Not on file      FAMILY HISTORY:  Family History  Problem Relation Age of Onset  . CVA Mother   . CAD Father   . Breast cancer Neg Hx      REVIEW OF SYSTEMS:  Constitutional: denies weight loss, fever, chills, or sweats  Eyes: denies any other vision changes, history of eye injury  ENT: denies sore throat, hearing problems  Respiratory: denies shortness of breath, wheezing  Cardiovascular: denies chest pain, palpitations  Gastrointestinal: Positive abdominal pain,  nausea and vomiting Genitourinary: denies burning with urination or urinary frequency Musculoskeletal: denies any other joint pains or cramps  Skin: denies any other rashes or skin discolorations  Neurological: denies any other headache, dizziness, weakness  Psychiatric: denies any other depression, anxiety   All other review of systems were negative   VITAL SIGNS:  Temp:  [97.5 F (36.4 C)] 97.5 F (36.4 C) (02/02 0511) Pulse Rate:  [53-107] 53 (02/02 0723) Resp:  [15-28] 28 (02/02 0723) BP: (139-175)/(64-124) 172/72 (02/02 0723) SpO2:  [94 %-99 %] 97 % (02/02 0723) Weight:  [81.6 kg] 81.6 kg (02/02 0513)     Height: 5\' 5"  (165.1 cm) Weight: 81.6 kg  BMI (Calculated): 29.95   INTAKE/OUTPUT:  This shift: No intake/output data recorded.  Last 2 shifts: @IOLAST2SHIFTS @   PHYSICAL EXAM:  Constitutional:  -- Normal body habitus  -- Awake, alert, and oriented x3  Eyes:  -- Pupils equally round and reactive to light  -- No scleral icterus  Ear, nose, and throat:  -- No jugular venous distension  Pulmonary:  -- No crackles  -- Equal breath sounds bilaterally -- Breathing non-labored at rest Cardiovascular:  -- S1, S2 present  -- No pericardial rubs Gastrointestinal:  -- Abdomen soft, nontender, non-distended, no guarding or rebound tenderness -- No abdominal masses appreciated, pulsatile or otherwise  Musculoskeletal and Integumentary:  -- Wounds: None appreciated -- Extremities: B/L UE and LE FROM, hands and feet warm, no edema  Neurologic:  -- Motor function: intact and symmetric -- Sensation: intact and symmetric   Labs:  CBC Latest Ref Rng & Units 07/28/2020 09/05/2019 09/04/2019  WBC 4.0 - 10.5 K/uL 10.7(H) 7.6 7.8  Hemoglobin 12.0 - 15.0 g/dL 16.0(H) 14.2 17.1(H)  Hematocrit 36.0 - 46.0 % 45.5 42.8 49.6(H)  Platelets 150 - 400 K/uL 154 132(L) 148(L)   CMP Latest Ref Rng & Units 07/28/2020 09/05/2019 09/04/2019  Glucose 70 - 99 mg/dL 185(H) 101(H) 125(H)  BUN 8 - 23  mg/dL 18 15 19   Creatinine 0.44 - 1.00 mg/dL 0.89 1.02(H) 1.02(H)  Sodium 135 - 145 mmol/L 135 135 135  Potassium 3.5 - 5.1 mmol/L 4.4 4.3 5.2(H)  Chloride 98 - 111 mmol/L 94(L) 103 99  CO2 22 - 32 mmol/L 29 26 28   Calcium 8.9 - 10.3 mg/dL 9.4 8.2(L) 9.5  Total Protein 6.5 - 8.1 g/dL 7.7 6.1(L) 8.0  Total Bilirubin 0.3 - 1.2 mg/dL 1.2 1.4(H) 1.7(H)  Alkaline Phos 38 - 126 U/L 71 52 72  AST 15 - 41 U/L 22 15 16   ALT 0 - 44 U/L 13 11 15     Imaging studies:  CLINICAL DATA:  Bowel obstruction suspected. Upper abdominal pain with nausea and vomiting  EXAM: CT ABDOMEN AND PELVIS WITH CONTRAST  TECHNIQUE: Multidetector CT imaging of the abdomen and pelvis was performed using the standard protocol following bolus administration of intravenous contrast.  CONTRAST:  160mL OMNIPAQUE IOHEXOL 300 MG/ML  SOLN  COMPARISON:  09/04/2019  FINDINGS: Lower chest: Large heart with coronary atherosclerotic calcification.  Hepatobiliary: No focal liver abnormality.Cholelithiasis. No evidence of cholecystitis.  Pancreas: Unremarkable.  Spleen: Low-density lesion in the subcapsular upper spleen measuring 9 mm, stable and usually incidental, often hemangioma.  Adrenals/Urinary Tract: Nodular bilateral adrenal gland thickening with unchanged shape and size, most consistent with adenomas. The right gland measures up to 27 x 19 mm. No hydronephrosis or stone. Tiny left renal cyst. Unremarkable bladder.  Stomach/Bowel: Dilated small bowel loops with fecalized segment before a transition point to decompressed ileal loops. Largely decompressed postoperative colon. No evidence of bowel perforation or necrosis. Mild left colonic diverticulosis.  Vascular/Lymphatic: Extensive atheromatous calcification. No acute vascular finding. No mass or adenopathy.  Reproductive:Hysterectomy  Other: Trace reactive pelvic/lower abdominal ascites.  Musculoskeletal: No acute abnormalities.  Lumbar spine degeneration with scoliosis and L3-4 anterolisthesis.  IMPRESSION: 1. Early/partial small bowel obstruction with transition point in the low central abdomen, similar to March 2021 occurrence. 2. Cholelithiasis without cholecystitis. 3.  Aortic Atherosclerosis (ICD10-I70.0).   Electronically Signed   By: Monte Fantasia M.D.   On: 07/28/2020 06:57   Assessment/Plan:  87 y.o. female with small bowel obstruction, complicated by pertinent comorbidities including A. fib  on Xarelto.  Patient with previous abdominal surgeries with previous episode of bowel obstruction.  Last episode a year ago.  All of her episode has resolved with conservative management.  I have reviewed the images and the labs and physical exam are not concerning of acute abdomen.  I agree with treating patient with conservative management with nasogastric tube decompression, bowel rest, IV hydration and pain management.  After few hours of decompression with NGT I will order Gastrografin challenge.  I agree with bridging anticoagulation with heparin in case patient needs surgical management during admission.  I will continue following with physical exam and images.  Appreciated hospitalist admission for medical management of comorbidities.  Arnold Long, MD

## 2020-07-29 ENCOUNTER — Inpatient Hospital Stay: Payer: Medicare Other

## 2020-07-29 ENCOUNTER — Encounter: Payer: Self-pay | Admitting: Internal Medicine

## 2020-07-29 DIAGNOSIS — I10 Essential (primary) hypertension: Secondary | ICD-10-CM | POA: Diagnosis not present

## 2020-07-29 DIAGNOSIS — K56609 Unspecified intestinal obstruction, unspecified as to partial versus complete obstruction: Secondary | ICD-10-CM | POA: Diagnosis not present

## 2020-07-29 DIAGNOSIS — I4891 Unspecified atrial fibrillation: Secondary | ICD-10-CM | POA: Diagnosis not present

## 2020-07-29 LAB — CBC
HCT: 40.6 % (ref 36.0–46.0)
Hemoglobin: 13.7 g/dL (ref 12.0–15.0)
MCH: 32.2 pg (ref 26.0–34.0)
MCHC: 33.7 g/dL (ref 30.0–36.0)
MCV: 95.5 fL (ref 80.0–100.0)
Platelets: 137 10*3/uL — ABNORMAL LOW (ref 150–400)
RBC: 4.25 MIL/uL (ref 3.87–5.11)
RDW: 13 % (ref 11.5–15.5)
WBC: 9.2 10*3/uL (ref 4.0–10.5)
nRBC: 0 % (ref 0.0–0.2)

## 2020-07-29 LAB — BASIC METABOLIC PANEL
Anion gap: 11 (ref 5–15)
BUN: 16 mg/dL (ref 8–23)
CO2: 28 mmol/L (ref 22–32)
Calcium: 8.7 mg/dL — ABNORMAL LOW (ref 8.9–10.3)
Chloride: 99 mmol/L (ref 98–111)
Creatinine, Ser: 0.87 mg/dL (ref 0.44–1.00)
GFR, Estimated: >60 mL/min (ref >60)
Glucose, Bld: 134 mg/dL — ABNORMAL HIGH (ref 70–99)
Potassium: 4 mmol/L (ref 3.5–5.1)
Sodium: 138 mmol/L (ref 135–145)

## 2020-07-29 LAB — PROTIME-INR
INR: 1.3 — ABNORMAL HIGH (ref 0.8–1.2)
Prothrombin Time: 15.5 seconds — ABNORMAL HIGH (ref 11.4–15.2)

## 2020-07-29 LAB — SEDIMENTATION RATE: Sed Rate: 7 mm/hr (ref 0–30)

## 2020-07-29 LAB — APTT
aPTT: 98 seconds — ABNORMAL HIGH (ref 24–36)
aPTT: 99 seconds — ABNORMAL HIGH (ref 24–36)

## 2020-07-29 LAB — HEPARIN LEVEL (UNFRACTIONATED): Heparin Unfractionated: 2.46 IU/mL — ABNORMAL HIGH (ref 0.30–0.70)

## 2020-07-29 MED ORDER — METOPROLOL TARTRATE 50 MG PO TABS
100.0000 mg | ORAL_TABLET | Freq: Two times a day (BID) | ORAL | Status: DC
  Administered 2020-07-29 – 2020-07-31 (×5): 100 mg via ORAL
  Filled 2020-07-29 (×5): qty 2

## 2020-07-29 NOTE — Consult Note (Signed)
[place] for Heparin  Indication: atrial fibrillation  Patient Measurements: Heparin Dosing Weight: 74.4kg  Labs: Recent Labs    07/28/20 0518 07/28/20 1003 07/29/20 0156 07/29/20 0341 07/29/20 0959  HGB 16.0*  --   --  13.7  --   HCT 45.5  --   --  40.6  --   PLT 154  --   --  137*  --   APTT  --  54* 98*  --  99*  LABPROT  --  40.4*  --  15.5*  --   INR  --  4.4*  --  1.3*  --   HEPARINUNFRC  --  2.52* 2.46*  --   --   CREATININE 0.89  --   --  0.87  --     Estimated Creatinine Clearance: 49.4 mL/min (by C-G formula based on SCr of 0.87 mg/dL).   Medical History: Past Medical History:  Diagnosis Date  . Atrial fibrillation ([place])   . Cancer ([place]) 08/29/2016   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  . Headache    behind right eye  . History of blood clots 07/10/2013   Left lower leg  . PVD (peripheral vascular disease) ([employer])   . SVT (supraventricular tachycardia) ([place])   . Wears dentures    full upper, p[artial lower    Medications:  Xarelto 20mg  PO QPM. Last dose was 2/1 at 18:30.  Assessment:  87yo female who reported to the ED w/ complaint of abdominal pain. She has a pmh of atrial fibrillation on chronic anticoagulation therapy with Xarelto, hx of prior bowel obstructions, and s/p sigmoid colectomy. Upon arrival to the ED pt was in rapid afib w/ elevated SBP in the 200's. Theres been concern for small bowel obstruction, and pt is on bowel rest w/ NG tube for decompression. Given pt is NPO and unable to take home Xarelto at this time, pharmacy has been consulted for heparin dosing.   Hgb is 16.0, and platelets 154.  Baseline HL 2.52, PT 40.4 INR 4.4>1.3, and aPTT 54. LFT's are wnl. Suspect Xarelto induced effect on baseline coagulation parameters.   Date Time HL/aPTT Rate/Comment 2/03  0156 2.46/98s Thera x1; no lab correlation. @1000  un/hr 2/03 0959      99s  Thera x2; @1000  un/hr  Goal of Therapy:  Heparin level  0.3-0.7 units/ml aPTT 66-102 seconds Monitor platelets by anticoagulation protocol: Yes   Plan:  APTT therapeutic X 2 , will draw daily aPTT/HL until lab correlation; then HL alone thereafter.  --Monitor H/H, platelets, and CBC daily.  Shanon Brow Cressida Milford,PharmD 07/29/2020,10:53 AM

## 2020-07-29 NOTE — Progress Notes (Signed)
[facility] Hospital Day(s): 1.   Post op day(s):  Marland Kitchen   Interval History: Patient seen and examined, no acute events or new complaints overnight. Patient reports feeling better from her abdominal pain.  She denies nausea or vomiting.  She reported passing one gas rectally.  Vital signs in last 24 hours: [min-max] current  Temp:  [97.9 F (36.6 C)-99.8 F (37.7 C)] 98 F (36.7 C) (02/03 1130) Pulse Rate:  [66-103] 77 (02/03 1130) Resp:  [17-21] 18 (02/03 0815) BP: (159-178)/(68-102) 163/79 (02/03 1130) SpO2:  [88 %-97 %] 93 % (02/03 1130) Weight:  [80.4 kg-83.5 kg] 83.1 kg (02/03 0521)     Height: 5\' 5"  (165.1 cm) Weight: 83.1 kg BMI (Calculated): 30.49   Physical Exam:  Constitutional: alert, cooperative and no distress  Respiratory: breathing non-labored at rest  Cardiovascular: regular rate and sinus rhythm  Gastrointestinal: soft, non-tender, and non-distended  Labs:  CBC Latest Ref Rng & Units 07/29/2020 07/28/2020 09/05/2019  WBC 4.0 - 10.5 K/uL 9.2 10.7(H) 7.6  Hemoglobin 12.0 - 15.0 g/dL 13.7 16.0(H) 14.2  Hematocrit 36.0 - 46.0 % 40.6 45.5 42.8  Platelets 150 - 400 K/uL 137(L) 154 132(L)   CMP Latest Ref Rng & Units 07/29/2020 07/28/2020 09/05/2019  Glucose 70 - 99 mg/dL 134(H) 185(H) 101(H)  BUN 8 - 23 mg/dL 16 18 15   Creatinine 0.44 - 1.00 mg/dL 0.87 0.89 1.02(H)  Sodium 135 - 145 mmol/L 138 135 135  Potassium 3.5 - 5.1 mmol/L 4.0 4.4 4.3  Chloride 98 - 111 mmol/L 99 94(L) 103  CO2 22 - 32 mmol/L 28 29 26   Calcium 8.9 - 10.3 mg/dL 8.7(L) 9.4 8.2(L)  Total Protein 6.5 - 8.1 g/dL - 7.7 6.1(L)  Total Bilirubin 0.3 - 1.2 mg/dL - 1.2 1.4(H)  Alkaline Phos 38 - 126 U/L - 71 52  AST 15 - 41 U/L - 22 15  ALT 0 - 44 U/L - 13 11    Imaging studies: Abdominal x-ray shows no small bowel dilation.  There is no air-fluid levels.  Assessment/Plan:  87 y.o. female with small bowel obstruction, complicated by pertinent comorbidities including A. fib on  Xarelto.  Patient with resolving small bowel obstruction.  I clamp the NG and will try clear liquids.  Patient with abdominal pain nausea or vomiting.  Abdominal physical exam benign.  No need for surgical intervention at this moment.  If she tolerates clear liquids without nausea I will plan to remove the NG tomorrow.  I encouraged the patient to ambulate.  Arnold Long, MD

## 2020-07-29 NOTE — Consult Note (Signed)
ANTICOAGULATION CONSULT NOTE  Pharmacy Consult for Heparin  Indication: atrial fibrillation  Patient Measurements: Heparin Dosing Weight: 74.4kg  Labs: Recent Labs    07/28/20 0518 07/28/20 1003 07/29/20 0156  HGB 16.0*  --   --   HCT 45.5  --   --   PLT 154  --   --   APTT  --  54* 98*  LABPROT  --  40.4*  --   INR  --  4.4*  --   HEPARINUNFRC  --  2.52* 2.46*  CREATININE 0.89  --   --     Estimated Creatinine Clearance: 47.6 mL/min (by C-G formula based on SCr of 0.89 mg/dL).   Medical History: Past Medical History:  Diagnosis Date  . Atrial fibrillation ([place])   . Cancer ([place]) 08/29/2016   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  . Headache    behind right eye  . History of blood clots 07/10/2013   Left lower leg  . PVD (peripheral vascular disease) ([place])   . SVT (supraventricular tachycardia) ([place])   . Wears dentures    full upper, p[artial lower    Medications:  Xarelto 20mg  PO QPM. Last dose was 2/1 at 18:30.  Assessment:  87yo female who reported to the ED w/ complaint of abdominal pain. She has a pmh of atrial fibrillation on chronic anticoagulation therapy with Xarelto, hx of prior bowel obstructions, and s/p sigmoid colectomy. Upon arrival to the ED pt was in rapid afib w/ elevated SBP in the 200's. Theres been concern for small bowel obstruction, and pt is on bowel rest w/ NG tube for decompression. Given pt is NPO and unable to take home Xarelto at this time, pharmacy has been consulted for heparin dosing.   Hgb is 16.0, and platelets 154. Baseline HL 2.52, PT 40.4 INR 4.4, and aPTT 54. LFT's are wnl. Suspect Xarelto induced effect on baseline coagulation parameters.   Goal of Therapy:  Heparin level 0.3-0.7 units/ml aPTT 66-102 seconds Monitor platelets by anticoagulation protocol: Yes   Plan:  --Will initiate heparin when next dose of Xarelto is due (18:00)   2/3 @ 0156:  APTT = 98,  HL = 2.46 APTT therapeutic X 1 , will draw confirmation aPTT in 8  hrs on 2/3 @ 1000.  Will check HL daily and use aPTT to guide dosing until HL and aPTT correlate.  --Monitor H/H, platelets, and CBC daily.  [place] D, Pharmacy Student  07/29/2020,3:17 AM

## 2020-07-29 NOTE — Progress Notes (Signed)
Patient Tiffany Shaffer bp high > 160 MD paged and recured chat with no returned call also noted pt has an active cardizem drip which has not  Been discontinued but noted there was a verbal order to  Stop it since 07/28/20 at 0539 hr is under control in 80s-9s mr again paged to clarify order but never got a return call from MD  Per [facility]   To give am metoprolol and clarify the order in am with team MD.

## 2020-07-29 NOTE — Progress Notes (Addendum)
Progress Note    Tiffany Shaffer  [id number] DOB: April 20, 1932  DOA: 07/28/2020 PCP: Tiffany Lange, NP      Brief Narrative:    Medical records reviewed and are as summarized below:  Tiffany Shaffer is a 87 y.o. female       Assessment/Plan:   Principal Problem:   Atrial fibrillation with rapid ventricular response ([place]) Active Problems:   Essential hypertension, benign   Small bowel obstruction (HCC)   CKD (chronic kidney disease) stage 3, GFR 30-59 ml/min (HCC)   GERD (gastroesophageal reflux disease)    Body mass index is 30.49 kg/m.    Small bowel obstruction: Diet has been advanced to clear diet by general surgeon.  Discontinue IV fluids.  Continue analgesics as needed for pain.  Antiemetics as needed for nausea/vomiting.  Of note, patient has history of recurrent bowel obstruction and has history of partial colectomy.  Atrial fibrillation with RVR: Heart rate has improved.  She is off of IV Cardizem infusion.  Xarelto has been held.  She is on IV heparin infusion.  Monitor heparin level per protocol.  Resume home dose of metoprolol.   Right temporal headache: No acute visual changes.  ESR ordered today was normal.  Analgesics as needed for pain.  PVD: Xarelto on hold.   Diet Order            Diet clear liquid Room service appropriate? Yes; Fluid consistency: Thin  Diet effective now                    Consultants:  General surgeon  Procedures:  None    Medications:   . metoprolol tartrate  5 mg Intravenous Q6H  . pantoprazole (PROTONIX) IV  40 mg Intravenous Q24H   Continuous Infusions: . sodium chloride 100 mL/hr at 07/29/20 0504  . diltiazem (CARDIZEM) infusion Stopped (07/28/20 0541)  . heparin 1,000 Units/hr (07/28/20 1839)     Anti-infectives (From admission, onward)   None             Family Communication/Anticipated D/C date and plan/Code Status   DVT prophylaxis: SCDs Start: 07/28/20 0909      Code Status: Full Code  Family Communication: None Disposition Plan:    Status is: Inpatient  Remains inpatient appropriate because:Inpatient level of care appropriate due to severity of illness   Dispo: The patient is from: Home              Anticipated d/c is to: Home              Anticipated d/c date is: 1 day              Patient currently is not medically stable to d/c.   Difficult to place patient No           Subjective:   C/o right temporal headache and abdominal pain but overall abdominal pain is a little better today.  No changes in vision.  She said her last bowel movement was yesterday prior to admission.  Objective:    Vitals:   07/29/20 0430 07/29/20 0521 07/29/20 0815 07/29/20 1130  BP:  (!) 159/88 (!) 168/85 (!) 163/79  Pulse:  89 87 77  Resp:  18 18   Temp:  98.2 F (36.8 C) 97.9 F (36.6 C) 98 F (36.7 C)  TempSrc:  Oral Oral Oral  SpO2:  95% 96% 93%  Weight: 83.5 kg 83.1 kg    Height:  No data found.   Intake/Output Summary (Last 24 hours) at 07/29/2020 1142 Last data filed at 07/29/2020 0600 Gross per 24 hour  Intake 1312.47 ml  Output 700 ml  Net 612.47 ml   Filed Weights   07/28/20 1800 07/29/20 0430 07/29/20 0521  Weight: 80.4 kg 83.5 kg 83.1 kg    Exam:  GEN: NAD SKIN:  EYES: EOMI ENT: MMM, +NG tube in place CV: RRR PULM: CTA B ABD: soft, ND, NT, +BS CNS: AAO x 3, non focal EXT: No edema or tenderness   Data Reviewed:   I have personally reviewed following labs and imaging studies:  Labs: Labs show the following:   Basic Metabolic Panel: Recent Labs  Lab 07/28/20 0518 07/29/20 0341  NA 135 138  K 4.4 4.0  CL 94* 99  CO2 29 28  GLUCOSE 185* 134*  BUN 18 16  CREATININE 0.89 0.87  CALCIUM 9.4 8.7*   GFR Estimated Creatinine Clearance: 49.4 mL/min (by C-G formula based on SCr of 0.87 mg/dL). Liver Function Tests: Recent Labs  Lab 07/28/20 0518  AST 22  ALT 13  ALKPHOS 71  BILITOT 1.2  PROT  7.7  ALBUMIN 4.2   Recent Labs  Lab 07/28/20 0518  LIPASE 28   No results for input(s): AMMONIA in the last 168 hours. Coagulation profile Recent Labs  Lab 07/28/20 1003 07/29/20 0341  INR 4.4* 1.3*    CBC: Recent Labs  Lab 07/28/20 0518 07/29/20 0341  WBC 10.7* 9.2  NEUTROABS 9.5*  --   HGB 16.0* 13.7  HCT 45.5 40.6  MCV 93.0 95.5  PLT 154 137*   Cardiac Enzymes: No results for input(s): CKTOTAL, CKMB, CKMBINDEX, TROPONINI in the last 168 hours. BNP (last 3 results) No results for input(s): PROBNP in the last 8760 hours. CBG: No results for input(s): GLUCAP in the last 168 hours. D-Dimer: No results for input(s): DDIMER in the last 72 hours. Hgb A1c: No results for input(s): HGBA1C in the last 72 hours. Lipid Profile: No results for input(s): CHOL, HDL, LDLCALC, TRIG, CHOLHDL, LDLDIRECT in the last 72 hours. Thyroid function studies: No results for input(s): TSH, T4TOTAL, T3FREE, THYROIDAB in the last 72 hours.  Invalid input(s): FREET3 Anemia work up: No results for input(s): VITAMINB12, FOLATE, FERRITIN, TIBC, IRON, RETICCTPCT in the last 72 hours. Sepsis Labs: Recent Labs  Lab 07/28/20 0518 07/29/20 0341  WBC 10.7* 9.2    Microbiology Recent Results (from the past 240 hour(s))  SARS CORONAVIRUS 2 (TAT 6-24 HRS) Nasopharyngeal Nasopharyngeal Swab     Status: None   Collection Time: 07/28/20  6:04 AM   Specimen: Nasopharyngeal Swab  Result Value Ref Range Status   SARS Coronavirus 2 NEGATIVE NEGATIVE Final    Comment: (NOTE) SARS-CoV-2 target nucleic acids are NOT DETECTED.  The SARS-CoV-2 RNA is generally detectable in upper and lower respiratory specimens during the acute phase of infection. Negative results do not preclude SARS-CoV-2 infection, do not rule out co-infections with other pathogens, and should not be used as the sole basis for treatment or other patient management decisions. Negative results must be combined with clinical  observations, patient history, and epidemiological information. The expected result is Negative.  Fact Sheet for Patients: SugarRoll.be  Fact Sheet for Healthcare Providers: https://www.woods-mathews.com/  This test is not yet approved or cleared by the [place] FDA and  has been authorized for detection and/or diagnosis of SARS-CoV-2 by FDA under an Emergency Use Authorization (EUA). This EUA will remain  in  effect (meaning this test can be used) for the duration of the COVID-19 declaration under Se ction 564(b)(1) of the Act, 21 U.S.C. section 360bbb-3(b)(1), unless the authorization is terminated or revoked sooner.  Performed at [facility], [place] [address], [place], [place] [address]     Procedures and diagnostic studies:  CT ABDOMEN PELVIS W CONTRAST  Result Date: 07/28/2020 CLINICAL DATA:  Bowel obstruction suspected. Upper abdominal pain with nausea and vomiting EXAM: CT ABDOMEN AND PELVIS WITH CONTRAST TECHNIQUE: Multidetector CT imaging of the abdomen and pelvis was performed using the standard protocol following bolus administration of intravenous contrast. CONTRAST:  172m OMNIPAQUE IOHEXOL 300 MG/ML  SOLN COMPARISON:  09/04/2019 FINDINGS: Lower chest: Large heart with coronary atherosclerotic calcification. Hepatobiliary: No focal liver abnormality.Cholelithiasis. No evidence of cholecystitis. Pancreas: Unremarkable. Spleen: Low-density lesion in the subcapsular upper spleen measuring 9 mm, stable and usually incidental, often hemangioma. Adrenals/Urinary Tract: Nodular bilateral adrenal gland thickening with unchanged shape and size, most consistent with adenomas. The right gland measures up to 27 x 19 mm. No hydronephrosis or stone. Tiny left renal cyst. Unremarkable bladder. Stomach/Bowel: Dilated small bowel loops with fecalized segment before a transition point to decompressed ileal loops. Largely decompressed postoperative  colon. No evidence of bowel perforation or necrosis. Mild left colonic diverticulosis. Vascular/Lymphatic: Extensive atheromatous calcification. No acute vascular finding. No mass or adenopathy. Reproductive:Hysterectomy Other: Trace reactive pelvic/lower abdominal ascites. Musculoskeletal: No acute abnormalities. Lumbar spine degeneration with scoliosis and L3-4 anterolisthesis. IMPRESSION: 1. Early/partial small bowel obstruction with transition point in the low central abdomen, similar to March 2021 occurrence. 2. Cholelithiasis without cholecystitis. 3.  Aortic Atherosclerosis (ICD10-I70.0). Electronically Signed   By: JMonte FantasiaM.D.   On: 07/28/2020 06:57   DG Abd Portable 1V-Small Bowel Obstruction Protocol-initial, 8 hr delay  Result Date: 07/29/2020 CLINICAL DATA:  Small-bowel obstruction. EXAM: PORTABLE ABDOMEN - 1 VIEW COMPARISON:  07/28/2020.  CT 07/28/2020. FINDINGS: NG tube noted with tip over the upper stomach. Side hole at the gastroesophageal junction. Advancement of approximately 10 cm suggested. Surgical clips and sutures noted over the abdomen. Gallstones again noted. Several loops of nondistended small bowel noted. Colonic gas pattern normal. No colonic distention. No free air. Aortoiliac and visceral atherosclerotic vascular calcification. Contrast from prior CT noted in the bladder. Degenerative changes scoliosis lumbar spine. Degenerative changes both hips. IMPRESSION: 1. NG tube noted with tip over the upper stomach. Side hole at the gastroesophageal junction. Advancement of approximately 10 cm suggested. 2. Gallstones again noted. 3. Several loops of nondistended small bowel noted. Colonic gas pattern is normal. No bowel distention or free air noted. Electronically Signed   By: TMarcello Moores Register   On: 07/29/2020 05:54   DG Abd Portable 1 View  Result Date: 07/28/2020 CLINICAL DATA:  NG tube insertion EXAM: PORTABLE ABDOMEN - 1 VIEW COMPARISON:  CT 07/28/2020 FINDINGS: Portable AP  radiograph of the lower chest and upper abdomen was performed for the purposes of enteric tube localization. NG tube is seen coursing below the diaphragm with distal tip and side port terminating within the expected location of the gastric body. No dilated loops of small bowel within the included upper abdomen. Cardiomegaly. IMPRESSION: NG tube tip and side port project within the expected location of the gastric body. Electronically Signed   By: NDavina PokeD.O.   On: 07/28/2020 09:56               LOS: 1 day   [facility]  Pager on [email or web address]. If 7PM-7AM, please contact night-coverage at [email or web address]     07/29/2020, 11:42 AM

## 2020-07-29 NOTE — Progress Notes (Signed)
ON call MD returned call  laterand also stated to hold off on cardizem and just give the 0600 am metoprolol  This done and bp came down to 155/88 no distress Pt remained NP NGT in place  Out put 450 this shift.

## 2020-07-30 ENCOUNTER — Inpatient Hospital Stay: Payer: Medicare Other

## 2020-07-30 DIAGNOSIS — K56609 Unspecified intestinal obstruction, unspecified as to partial versus complete obstruction: Secondary | ICD-10-CM | POA: Diagnosis not present

## 2020-07-30 DIAGNOSIS — I4891 Unspecified atrial fibrillation: Secondary | ICD-10-CM | POA: Diagnosis not present

## 2020-07-30 LAB — CBC
HCT: 37.5 % (ref 36.0–46.0)
Hemoglobin: 12.6 g/dL (ref 12.0–15.0)
MCH: 32.1 pg (ref 26.0–34.0)
MCHC: 33.6 g/dL (ref 30.0–36.0)
MCV: 95.4 fL (ref 80.0–100.0)
Platelets: 106 10*3/uL — ABNORMAL LOW (ref 150–400)
RBC: 3.93 MIL/uL (ref 3.87–5.11)
RDW: 12.8 % (ref 11.5–15.5)
WBC: 8.8 10*3/uL (ref 4.0–10.5)
nRBC: 0 % (ref 0.0–0.2)

## 2020-07-30 LAB — BASIC METABOLIC PANEL
Anion gap: 10 (ref 5–15)
BUN: 16 mg/dL (ref 8–23)
CO2: 27 mmol/L (ref 22–32)
Calcium: 8.7 mg/dL — ABNORMAL LOW (ref 8.9–10.3)
Chloride: 98 mmol/L (ref 98–111)
Creatinine, Ser: 0.73 mg/dL (ref 0.44–1.00)
GFR, Estimated: >60 mL/min (ref >60)
Glucose, Bld: 107 mg/dL — ABNORMAL HIGH (ref 70–99)
Potassium: 3.9 mmol/L (ref 3.5–5.1)
Sodium: 135 mmol/L (ref 135–145)

## 2020-07-30 LAB — HEPARIN LEVEL (UNFRACTIONATED): Heparin Unfractionated: 0.7 IU/mL (ref 0.30–0.70)

## 2020-07-30 LAB — APTT: aPTT: 91 seconds — ABNORMAL HIGH (ref 24–36)

## 2020-07-30 MED ORDER — SODIUM CHLORIDE 0.9% FLUSH
3.0000 mL | Freq: Two times a day (BID) | INTRAVENOUS | Status: DC
  Administered 2020-07-30: 3 mL via INTRAVENOUS

## 2020-07-30 MED ORDER — IRBESARTAN 150 MG PO TABS
300.0000 mg | ORAL_TABLET | Freq: Every day | ORAL | Status: DC
  Administered 2020-07-30 – 2020-07-31 (×2): 300 mg via ORAL
  Filled 2020-07-30 (×2): qty 2

## 2020-07-30 MED ORDER — SPIRONOLACTONE 25 MG PO TABS
25.0000 mg | ORAL_TABLET | Freq: Every day | ORAL | Status: DC
Start: 2020-07-30 — End: 2020-07-31
  Administered 2020-07-30 – 2020-07-31 (×2): 25 mg via ORAL
  Filled 2020-07-30 (×2): qty 1

## 2020-07-30 MED ORDER — RIVAROXABAN 15 MG PO TABS
15.0000 mg | ORAL_TABLET | Freq: Every day | ORAL | Status: DC
  Filled 2020-07-30: qty 1

## 2020-07-30 MED ORDER — RIVAROXABAN 20 MG PO TABS
20.0000 mg | ORAL_TABLET | Freq: Every day | ORAL | Status: DC
Start: 2020-07-30 — End: 2020-07-31
  Administered 2020-07-30: 20 mg via ORAL
  Filled 2020-07-30: qty 1

## 2020-07-30 MED ORDER — MORPHINE SULFATE (PF) 4 MG/ML IV SOLN: 4.0000 mg | INTRAVENOUS | Status: DC | PRN

## 2020-07-30 MED ORDER — PANTOPRAZOLE SODIUM 40 MG PO TBEC
40.0000 mg | DELAYED_RELEASE_TABLET | Freq: Every day | ORAL | Status: DC
  Administered 2020-07-30 – 2020-07-31 (×2): 40 mg via ORAL
  Filled 2020-07-30 (×2): qty 1

## 2020-07-30 NOTE — Progress Notes (Signed)
[place] Hospital Day(s): 2.   Post op day(s):  Marland Kitchen   Interval History: Patient seen and examined, no acute events or new complaints overnight. Patient reports continue passing gas this morning.  She denied nausea this morning.  She denies abdominal pain.  Abdominal x-ray this morning shows contrast in the descending colon.  I personally evaluated the images.  Vital signs in last 24 hours: [min-max] current  Temp:  [97.6 F (36.4 C)-98.5 F (36.9 C)] 98.4 F (36.9 C) (02/04 0739) Pulse Rate:  [72-94] 79 (02/04 0739) Resp:  [17-18] 17 (02/04 0739) BP: (163-184)/(76-102) 184/102 (02/04 0739) SpO2:  [91 %-93 %] 92 % (02/04 0739) Weight:  [82.2 kg] 82.2 kg (02/04 0333)     Height: 5\' 5"  (165.1 cm) Weight: 82.2 kg BMI (Calculated): 30.17   Physical Exam:  Constitutional: alert, cooperative and no distress  Respiratory: breathing non-labored at rest  Cardiovascular: regular rate and sinus rhythm  Gastrointestinal: soft, non-tender, and non-distended  Labs:  CBC Latest Ref Rng & Units 07/30/2020 07/29/2020 07/28/2020  WBC 4.0 - 10.5 K/uL 8.8 9.2 10.7(H)  Hemoglobin 12.0 - 15.0 g/dL 12.6 13.7 16.0(H)  Hematocrit 36.0 - 46.0 % 37.5 40.6 45.5  Platelets 150 - 400 K/uL 106(L) 137(L) 154   CMP Latest Ref Rng & Units 07/30/2020 07/29/2020 07/28/2020  Glucose 70 - 99 mg/dL 107(H) 134(H) 185(H)  BUN 8 - 23 mg/dL 16 16 18   Creatinine 0.44 - 1.00 mg/dL 0.73 0.87 0.89  Sodium 135 - 145 mmol/L 135 138 135  Potassium 3.5 - 5.1 mmol/L 3.9 4.0 4.4  Chloride 98 - 111 mmol/L 98 99 94(L)  CO2 22 - 32 mmol/L 27 28 29   Calcium 8.9 - 10.3 mg/dL 8.7(L) 8.7(L) 9.4  Total Protein 6.5 - 8.1 g/dL - - 7.7  Total Bilirubin 0.3 - 1.2 mg/dL - - 1.2  Alkaline Phos 38 - 126 U/L - - 71  AST 15 - 41 U/L - - 22  ALT 0 - 44 U/L - - 13    Imaging studies: Abdominal x-ray shows contrast in the descending and sigmoid colon.  No sign of obstructive pattern.   Assessment/Plan:  87 y.o.femalewith small  bowel obstruction, complicated by pertinent comorbidities includingA. fib on Xarelto.  Patient again with quick resolution of obstructive pattern.  I will order removal of the NGT and advance diet to full liquids.  Diet can be advanced as tolerated.  I encouraged the patient to ambulate.  Arnold Long, MD

## 2020-07-30 NOTE — Progress Notes (Signed)
PHARMACIST - PHYSICIAN COMMUNICATION  CONCERNING: IV to Oral Route Change Policy  RECOMMENDATION: This patient is receiving Pantoprazole by the intravenous route.  Based on criteria approved by the Pharmacy and Therapeutics Committee, the intravenous medication(s) is/are being converted to the equivalent oral dose form(s).   DESCRIPTION: These criteria include:  The patient is eating (either orally or via tube) and/or has been taking other orally administered medications for a least 24 hours  The patient has no evidence of active gastrointestinal bleeding or impaired GI absorption (gastrectomy, short bowel, patient on TNA or NPO).  If you have questions about this conversion, please contact the Pharmacy Department  []   [phone] )  [facility] [x]   [phone] )  [facility] []   [phone] )  [facility] []   [phone] )  [facility] []   [phone] )  [place], [facility] 07/30/2020 9:16 AM

## 2020-07-30 NOTE — Progress Notes (Signed)
Progress Note    Tiffany Shaffer  [id number] DOB: July 19, 1931  DOA: 07/28/2020 PCP: Sallee Lange, NP      Brief Narrative:    Medical records reviewed and are as summarized below:  Tiffany Shaffer is a 87 y.o. female       Assessment/Plan:   Principal Problem:   Atrial fibrillation with rapid ventricular response ([place]) Active Problems:   Essential hypertension, benign   Small bowel obstruction (HCC)   CKD (chronic kidney disease) stage 3, GFR 30-59 ml/min (HCC)   GERD (gastroesophageal reflux disease)    Body mass index is 30.17 kg/m.    Small bowel obstruction: Diet has been advanced to full liquid diet by general surgeon.  Advance diet as tolerated.  Antiemetics as needed for nausea/vomiting.  Of note, patient has history of recurrent bowel obstruction and has history of partial colectomy.  Atrial fibrillation with RVR: Heart rate has improved.  Discontinue IV heparin infusion and start Xarelto this evening.  Continue metoprolol.   Hypertension: Uncontrolled.  Substitute ibesartan for telmisartan.  Resume Aldactone  Right temporal headache: No acute visual changes.  ESR is normal.   Analgesics as needed for pain.  Headache may be due to uncontrolled hypertension.  Antihypertensives have been resumed.  PVD: Resume Xarelto.   Diet Order            Diet full liquid Room service appropriate? Yes; Fluid consistency: Thin  Diet effective now                    Consultants:  General surgeon  Procedures:  None    Medications:   . irbesartan  300 mg Oral Daily  . metoprolol tartrate  100 mg Oral BID  . pantoprazole  40 mg Oral Daily  . rivaroxaban  15 mg Oral Q supper  . spironolactone  25 mg Oral Daily   Continuous Infusions:    Anti-infectives (From admission, onward)   None             Family Communication/Anticipated D/C date and plan/Code Status   DVT prophylaxis: SCDs Start: 07/28/20 0909 Rivaroxaban  (XARELTO) tablet 15 mg     Code Status: Full Code  Family Communication: None Disposition Plan:    Status is: Inpatient  Remains inpatient appropriate because:Inpatient level of care appropriate due to severity of illness   Dispo: The patient is from: Home              Anticipated d/c is to: Home              Anticipated d/c date is: 1 day              Patient currently is not medically stable to d/c.   Difficult to place patient No           Subjective:   C/o right sided headache and abdominal gas pain no vomiting.  Objective:    Vitals:   07/29/20 1624 07/29/20 1925 07/30/20 0333 07/30/20 0739  BP: (!) 169/76 (!) 179/97 (!) 173/88 (!) 184/102  Pulse: 72 94 78 79  Resp:  '18 17 17  ' Temp: 98.5 F (36.9 C) 97.6 F (36.4 C) 97.6 F (36.4 C) 98.4 F (36.9 C)  TempSrc: Oral Oral  Oral  SpO2: 93% 91% 91% 92%  Weight:   82.2 kg   Height:       No data found.   Intake/Output Summary (Last 24 hours) at 07/30/2020 1430  Last data filed at 07/30/2020 1330 Gross per 24 hour  Intake 349.03 ml  Output --  Net 349.03 ml   Filed Weights   07/29/20 0430 07/29/20 0521 07/30/20 0333  Weight: 83.5 kg 83.1 kg 82.2 kg    Exam:  GEN: NAD SKIN: No rash EYES: EOMI  ENT: MMM, +NG tube CV: RRR PULM: CTA B ABD: soft, ND, NT, +BS CNS: AAO x 3, non focal EXT: No edema or tenderness       Data Reviewed:   I have personally reviewed following labs and imaging studies:  Labs: Labs show the following:   Basic Metabolic Panel: Recent Labs  Lab 07/28/20 0518 07/29/20 0341 07/30/20 0351  NA 135 138 135  K 4.4 4.0 3.9  CL 94* 99 98  CO2 '29 28 27  ' GLUCOSE 185* 134* 107*  BUN '18 16 16  ' CREATININE 0.89 0.87 0.73  CALCIUM 9.4 8.7* 8.7*   GFR Estimated Creatinine Clearance: 53.5 mL/min (by C-G formula based on SCr of 0.73 mg/dL). Liver Function Tests: Recent Labs  Lab 07/28/20 0518  AST 22  ALT 13  ALKPHOS 71  BILITOT 1.2  PROT 7.7  ALBUMIN 4.2    Recent Labs  Lab 07/28/20 0518  LIPASE 28   No results for input(s): AMMONIA in the last 168 hours. Coagulation profile Recent Labs  Lab 07/28/20 1003 07/29/20 0341  INR 4.4* 1.3*    CBC: Recent Labs  Lab 07/28/20 0518 07/29/20 0341 07/30/20 0351  WBC 10.7* 9.2 8.8  NEUTROABS 9.5*  --   --   HGB 16.0* 13.7 12.6  HCT 45.5 40.6 37.5  MCV 93.0 95.5 95.4  PLT 154 137* 106*   Cardiac Enzymes: No results for input(s): CKTOTAL, CKMB, CKMBINDEX, TROPONINI in the last 168 hours. BNP (last 3 results) No results for input(s): PROBNP in the last 8760 hours. CBG: No results for input(s): GLUCAP in the last 168 hours. D-Dimer: No results for input(s): DDIMER in the last 72 hours. Hgb A1c: No results for input(s): HGBA1C in the last 72 hours. Lipid Profile: No results for input(s): CHOL, HDL, LDLCALC, TRIG, CHOLHDL, LDLDIRECT in the last 72 hours. Thyroid function studies: No results for input(s): TSH, T4TOTAL, T3FREE, THYROIDAB in the last 72 hours.  Invalid input(s): FREET3 Anemia work up: No results for input(s): VITAMINB12, FOLATE, FERRITIN, TIBC, IRON, RETICCTPCT in the last 72 hours. Sepsis Labs: Recent Labs  Lab 07/28/20 0518 07/29/20 0341 07/30/20 0351  WBC 10.7* 9.2 8.8    Microbiology Recent Results (from the past 240 hour(s))  SARS CORONAVIRUS 2 (TAT 6-24 HRS) Nasopharyngeal Nasopharyngeal Swab     Status: None   Collection Time: 07/28/20  6:04 AM   Specimen: Nasopharyngeal Swab  Result Value Ref Range Status   SARS Coronavirus 2 NEGATIVE NEGATIVE Final    Comment: (NOTE) SARS-CoV-2 target nucleic acids are NOT DETECTED.  The SARS-CoV-2 RNA is generally detectable in upper and lower respiratory specimens during the acute phase of infection. Negative results do not preclude SARS-CoV-2 infection, do not rule out co-infections with other pathogens, and should not be used as the sole basis for treatment or other patient management decisions. Negative  results must be combined with clinical observations, patient history, and epidemiological information. The expected result is Negative.  Fact Sheet for Patients: SugarRoll.be  Fact Sheet for Healthcare Providers: https://www.woods-mathews.com/  This test is not yet approved or cleared by the [place] FDA and  has been authorized for detection and/or diagnosis of SARS-CoV-2 by  FDA under an Emergency Use Authorization (EUA). This EUA will remain  in effect (meaning this test can be used) for the duration of the COVID-19 declaration under Se ction 564(b)(1) of the Act, 21 U.S.C. section 360bbb-3(b)(1), unless the authorization is terminated or revoked sooner.  Performed at [facility], [place] [address] [place], [place] [address]     Procedures and diagnostic studies:  DG Abd 2 Views  Result Date: 07/30/2020 CLINICAL DATA:  Small-bowel obstruction EXAM: ABDOMEN - 2 VIEW COMPARISON:  07/29/2020, CT 07/28/2020 FINDINGS: Nasogastric tube extends into the left upper quadrant of the abdomen. Terminal sidehole is again seen at the expected gastroesophageal junction. Since the prior examination, contrast from prior CT examination has now migrated into the mid to distal colon which is nondistended. However, there is increasing small bowel gas as well as mild gaseous distension of small bowel within the mid abdomen suggesting recurrent obstruction within the distal small bowel. No free intraperitoneal gas. Cholelithiasis again noted. Numerous surgical clips seen within the left mid abdomen. Vascular calcifications noted within the left upper quadrant. Phleboliths within the pelvis. Degenerative changes noted within the lumbar spine. IMPRESSION: Progressive dilation of multiple loops of a small bowel within the mid abdomen suggesting a recurrent distal small bowel obstruction since immediate prior abdominal radiograph, despite passage of contrast into  the distal colon. Electronically Signed   By: Fidela Salisbury MD   On: 07/30/2020 09:02   DG Abd Portable 1V-Small Bowel Obstruction Protocol-initial, 8 hr delay  Result Date: 07/29/2020 CLINICAL DATA:  Small-bowel obstruction. EXAM: PORTABLE ABDOMEN - 1 VIEW COMPARISON:  07/28/2020.  CT 07/28/2020. FINDINGS: NG tube noted with tip over the upper stomach. Side hole at the gastroesophageal junction. Advancement of approximately 10 cm suggested. Surgical clips and sutures noted over the abdomen. Gallstones again noted. Several loops of nondistended small bowel noted. Colonic gas pattern normal. No colonic distention. No free air. Aortoiliac and visceral atherosclerotic vascular calcification. Contrast from prior CT noted in the bladder. Degenerative changes scoliosis lumbar spine. Degenerative changes both hips. IMPRESSION: 1. NG tube noted with tip over the upper stomach. Side hole at the gastroesophageal junction. Advancement of approximately 10 cm suggested. 2. Gallstones again noted. 3. Several loops of nondistended small bowel noted. Colonic gas pattern is normal. No bowel distention or free air noted. Electronically Signed   By: Marcello Moores  Register   On: 07/29/2020 05:54               LOS: 2 days   Madysun Thall  [facility]   Pager on [email or web address]. If 7PM-7AM, please contact night-coverage at [email or web address]     07/30/2020, 2:30 PM

## 2020-07-30 NOTE — Care Management Important Message (Signed)
Important Message  Patient Details  Name: Tiffany Shaffer MRN: [id number] Date of Birth: August 21, 1931   Medicare Important Message Given:  Yes     Dannette Barbara 07/30/2020, 11:43 AM

## 2020-07-30 NOTE — Consult Note (Signed)
ANTICOAGULATION CONSULT NOTE  Pharmacy Consult for Heparin  Indication: atrial fibrillation  Patient Measurements: Heparin Dosing Weight: 74.4kg  Labs: Recent Labs    07/28/20 0518 07/28/20 1003 07/28/20 1003 07/29/20 0156 07/29/20 0341 07/29/20 0959 07/30/20 0351  HGB 16.0*  --   --   --  13.7  --  12.6  HCT 45.5  --   --   --  40.6  --  37.5  PLT 154  --   --   --  137*  --  106*  APTT  --  54*   < > 98*  --  99* 91*  LABPROT  --  40.4*  --   --  15.5*  --   --   INR  --  4.4*  --   --  1.3*  --   --   HEPARINUNFRC  --  2.52*  --  2.46*  --   --  0.70  CREATININE 0.89  --   --   --  0.87  --  0.73   < > = values in this interval not displayed.    Estimated Creatinine Clearance: 53.5 mL/min (by C-G formula based on SCr of 0.73 mg/dL).   Medical History: Past Medical History:  Diagnosis Date  . Atrial fibrillation ([place])   . Cancer ([place]) 08/29/2016   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  . Headache    behind right eye  . History of blood clots 07/10/2013   Left lower leg  . PVD (peripheral vascular disease) ([place])   . SVT (supraventricular tachycardia) ([place])   . Wears dentures    full upper, p[artial lower    Medications:  Xarelto 20mg  PO QPM. Last dose was 2/1 at 18:30.  Assessment:  87yo female who reported to the ED w/ complaint of abdominal pain. She has a pmh of atrial fibrillation on chronic anticoagulation therapy with Xarelto, hx of prior bowel obstructions, and s/p sigmoid colectomy. Upon arrival to the ED pt was in rapid afib w/ elevated SBP in the 200's. Theres been concern for small bowel obstruction, and pt is on bowel rest w/ NG tube for decompression. Given pt is NPO and unable to take home Xarelto at this time, pharmacy has been consulted for heparin dosing.   Hgb is 16.0, and platelets 154.  Baseline HL 2.52, PT 40.4 INR 4.4>1.3, and aPTT 54. LFT's are wnl. Suspect Xarelto induced effect on baseline coagulation parameters.    Date Time HL/aPTT Rate/Comment 2/03  0156 2.46/98s Thera x1; no lab correlation. @1000  un/hr 2/03 0959      99s  Thera x2; @1000  un/hr 2/04     0351     0.7 / 91           Therapeutic X 3   Goal of Therapy:  Heparin level 0.3-0.7 units/ml aPTT 66-102 seconds Monitor platelets by anticoagulation protocol: Yes   Plan:  2/4 @ 0351 :    APTT = 91,  HL = 0.7 Can use HL to guide dosing now that aPTT and HL are therapeutic.  Will continue pt on current rate and recheck HL on 2/5 with AM labs.   Tawana Pasch D,PharmD 07/30/2020,6:26 AM

## 2020-07-31 DIAGNOSIS — I4891 Unspecified atrial fibrillation: Secondary | ICD-10-CM | POA: Diagnosis not present

## 2020-07-31 DIAGNOSIS — I10 Essential (primary) hypertension: Secondary | ICD-10-CM | POA: Diagnosis not present

## 2020-07-31 DIAGNOSIS — K56609 Unspecified intestinal obstruction, unspecified as to partial versus complete obstruction: Secondary | ICD-10-CM | POA: Diagnosis not present

## 2020-07-31 MED ORDER — MENTHOL 3 MG MT LOZG
1.0000 | LOZENGE | OROMUCOSAL | Status: DC | PRN
  Administered 2020-07-31: 3 mg via ORAL
  Filled 2020-07-31: qty 9

## 2020-07-31 NOTE — Discharge Summary (Signed)
Physician Discharge Summary  Tiffany Shaffer:[id number] DOB: 1932-06-19 DOA: 07/28/2020  PCP: Sallee Lange, NP  Admit date: 07/28/2020 Discharge date: 07/31/2020  Discharge disposition: Home   Recommendations for Outpatient Follow-Up:   Follow-up with PCP in 1 week.    Discharge Diagnosis:   Principal Problem:   Atrial fibrillation with rapid ventricular response (HCC) Active Problems:   Essential hypertension, benign   Small bowel obstruction (HCC)   CKD (chronic kidney disease) stage 3, GFR 30-59 ml/min (HCC)   GERD (gastroesophageal reflux disease)    Discharge Condition: Stable.  Diet recommendation:  Diet Order            Diet - low sodium heart healthy           DIET SOFT Room service appropriate? Yes; Fluid consistency: Thin  Diet effective now                   Code Status: Full Code     Hospital Course:   Ms. Tiffany Shaffer is a 87 y.o. female with medical history significant for atrial fibrillation on chronic anticoagulation therapy with Xarelto, history of prior bowel obstruction that resolved with conservative management, status post sigmoid colectomy, who presented to the hospital because of nausea, vomiting and severe abdominal pain.    She was admitted to the hospital with small bowel obstruction.  She was treated conservatively with bowel rest, gastric decompression with NG tube, IV fluids, analgesics and antiemetics.  General surgeon was consulted to assist with management.  Her condition slowly improved and she was able to tolerate a regular diet without any recurrence of symptoms.  She is deemed stable for discharge to home.    Medical Consultants:    General surgeon, Dr. Windell Moment   Discharge Exam:    Vitals:   07/31/20 0337 07/31/20 0343 07/31/20 0741 07/31/20 1202  BP: (!) 154/89  (!) 182/85 (!) 163/82  Pulse: (!) 45  82 75  Resp: 17  19   Temp: 97.7 F (36.5 C)  (!) 97.5 F (36.4 C) 98.2 F (36.8 C)  TempSrc:  Oral  Oral Oral  SpO2: 93%  93% 96%  Weight:  81.9 kg    Height:         GEN: NAD SKIN: Warm and dry EYES: No pallor or icterus ENT: MMM CV: RRR PULM: CTA B ABD: soft, ND, NT, +BS CNS: AAO x 3, non focal EXT: No edema or tenderness   The results of significant diagnostics from this hospitalization (including imaging, microbiology, ancillary and laboratory) are listed below for reference.     Procedures and Diagnostic Studies:   CT ABDOMEN PELVIS W CONTRAST  Result Date: 07/28/2020 CLINICAL DATA:  Bowel obstruction suspected. Upper abdominal pain with nausea and vomiting EXAM: CT ABDOMEN AND PELVIS WITH CONTRAST TECHNIQUE: Multidetector CT imaging of the abdomen and pelvis was performed using the standard protocol following bolus administration of intravenous contrast. CONTRAST:  180mL OMNIPAQUE IOHEXOL 300 MG/ML  SOLN COMPARISON:  09/04/2019 FINDINGS: Lower chest: Large heart with coronary atherosclerotic calcification. Hepatobiliary: No focal liver abnormality.Cholelithiasis. No evidence of cholecystitis. Pancreas: Unremarkable. Spleen: Low-density lesion in the subcapsular upper spleen measuring 9 mm, stable and usually incidental, often hemangioma. Adrenals/Urinary Tract: Nodular bilateral adrenal gland thickening with unchanged shape and size, most consistent with adenomas. The right gland measures up to 27 x 19 mm. No hydronephrosis or stone. Tiny left renal cyst. Unremarkable bladder. Stomach/Bowel: Dilated small bowel loops with fecalized segment before a  transition point to decompressed ileal loops. Largely decompressed postoperative colon. No evidence of bowel perforation or necrosis. Mild left colonic diverticulosis. Vascular/Lymphatic: Extensive atheromatous calcification. No acute vascular finding. No mass or adenopathy. Reproductive:Hysterectomy Other: Trace reactive pelvic/lower abdominal ascites. Musculoskeletal: No acute abnormalities. Lumbar spine degeneration with scoliosis  and L3-4 anterolisthesis. IMPRESSION: 1. Early/partial small bowel obstruction with transition point in the low central abdomen, similar to March 2021 occurrence. 2. Cholelithiasis without cholecystitis. 3.  Aortic Atherosclerosis (ICD10-I70.0). Electronically Signed   By: Monte Fantasia M.D.   On: 07/28/2020 06:57   DG Abd Portable 1V-Small Bowel Obstruction Protocol-initial, 8 hr delay  Result Date: 07/29/2020 CLINICAL DATA:  Small-bowel obstruction. EXAM: PORTABLE ABDOMEN - 1 VIEW COMPARISON:  07/28/2020.  CT 07/28/2020. FINDINGS: NG tube noted with tip over the upper stomach. Side hole at the gastroesophageal junction. Advancement of approximately 10 cm suggested. Surgical clips and sutures noted over the abdomen. Gallstones again noted. Several loops of nondistended small bowel noted. Colonic gas pattern normal. No colonic distention. No free air. Aortoiliac and visceral atherosclerotic vascular calcification. Contrast from prior CT noted in the bladder. Degenerative changes scoliosis lumbar spine. Degenerative changes both hips. IMPRESSION: 1. NG tube noted with tip over the upper stomach. Side hole at the gastroesophageal junction. Advancement of approximately 10 cm suggested. 2. Gallstones again noted. 3. Several loops of nondistended small bowel noted. Colonic gas pattern is normal. No bowel distention or free air noted. Electronically Signed   By: Marcello Moores  Register   On: 07/29/2020 05:54   DG Abd Portable 1 View  Result Date: 07/28/2020 CLINICAL DATA:  NG tube insertion EXAM: PORTABLE ABDOMEN - 1 VIEW COMPARISON:  CT 07/28/2020 FINDINGS: Portable AP radiograph of the lower chest and upper abdomen was performed for the purposes of enteric tube localization. NG tube is seen coursing below the diaphragm with distal tip and side port terminating within the expected location of the gastric body. No dilated loops of small bowel within the included upper abdomen. Cardiomegaly. IMPRESSION: NG tube tip and  side port project within the expected location of the gastric body. Electronically Signed   By: Davina Poke D.O.   On: 07/28/2020 09:56     Labs:   Basic Metabolic Panel: Recent Labs  Lab 07/28/20 0518 07/29/20 0341 07/30/20 0351  NA 135 138 135  K 4.4 4.0 3.9  CL 94* 99 98  CO2 29 28 27   GLUCOSE 185* 134* 107*  BUN 18 16 16   CREATININE 0.89 0.87 0.73  CALCIUM 9.4 8.7* 8.7*   GFR Estimated Creatinine Clearance: 53.4 mL/min (by C-G formula based on SCr of 0.73 mg/dL). Liver Function Tests: Recent Labs  Lab 07/28/20 0518  AST 22  ALT 13  ALKPHOS 71  BILITOT 1.2  PROT 7.7  ALBUMIN 4.2   Recent Labs  Lab 07/28/20 0518  LIPASE 28   No results for input(s): AMMONIA in the last 168 hours. Coagulation profile Recent Labs  Lab 07/28/20 1003 07/29/20 0341  INR 4.4* 1.3*    CBC: Recent Labs  Lab 07/28/20 0518 07/29/20 0341 07/30/20 0351  WBC 10.7* 9.2 8.8  NEUTROABS 9.5*  --   --   HGB 16.0* 13.7 12.6  HCT 45.5 40.6 37.5  MCV 93.0 95.5 95.4  PLT 154 137* 106*   Cardiac Enzymes: No results for input(s): CKTOTAL, CKMB, CKMBINDEX, TROPONINI in the last 168 hours. BNP: Invalid input(s): POCBNP CBG: No results for input(s): GLUCAP in the last 168 hours. D-Dimer No results for input(s):  DDIMER in the last 72 hours. Hgb A1c No results for input(s): HGBA1C in the last 72 hours. Lipid Profile No results for input(s): CHOL, HDL, LDLCALC, TRIG, CHOLHDL, LDLDIRECT in the last 72 hours. Thyroid function studies No results for input(s): TSH, T4TOTAL, T3FREE, THYROIDAB in the last 72 hours.  Invalid input(s): FREET3 Anemia work up No results for input(s): VITAMINB12, FOLATE, FERRITIN, TIBC, IRON, RETICCTPCT in the last 72 hours. Microbiology Recent Results (from the past 240 hour(s))  SARS CORONAVIRUS 2 (TAT 6-24 HRS) Nasopharyngeal Nasopharyngeal Swab     Status: None   Collection Time: 07/28/20  6:04 AM   Specimen: Nasopharyngeal Swab  Result Value Ref  Range Status   SARS Coronavirus 2 NEGATIVE NEGATIVE Final    Comment: (NOTE) SARS-CoV-2 target nucleic acids are NOT DETECTED.  The SARS-CoV-2 RNA is generally detectable in upper and lower respiratory specimens during the acute phase of infection. Negative results do not preclude SARS-CoV-2 infection, do not rule out co-infections with other pathogens, and should not be used as the sole basis for treatment or other patient management decisions. Negative results must be combined with clinical observations, patient history, and epidemiological information. The expected result is Negative.  Fact Sheet for Patients: SugarRoll.be  Fact Sheet for Healthcare Providers: https://www.woods-mathews.com/  This test is not yet approved or cleared by the [place] FDA and  has been authorized for detection and/or diagnosis of SARS-CoV-2 by FDA under an Emergency Use Authorization (EUA). This EUA will remain  in effect (meaning this test can be used) for the duration of the COVID-19 declaration under Se ction 564(b)(1) of the Act, 21 U.S.C. section 360bbb-3(b)(1), unless the authorization is terminated or revoked sooner.  Performed at [facility], [place] [address], [place], [place] [address]      Discharge Instructions:   Discharge Instructions    Diet - low sodium heart healthy   Complete by: As directed    Increase activity slowly   Complete by: As directed      Allergies as of 07/31/2020      Reactions   Pravastatin Other (See Comments)   Muscle aches   Statins       Medication List    TAKE these medications   acetaminophen 500 MG tablet Commonly known as: TYLENOL Take 500-1,000 mg by mouth every 6 (six) hours as needed for mild pain or fever.   cyanocobalamin 1000 MCG/ML injection Commonly known as: (VITAMIN B-12) Inject 1,000 mcg into the muscle every 6 (six) weeks.   furosemide 20 MG tablet Commonly known as:  LASIX Take 20 mg by mouth daily.   glucosamine-chondroitin 500-400 MG tablet Take 1 tablet by mouth daily.   metoprolol tartrate 25 MG tablet Commonly known as: LOPRESSOR Take 100 mg by mouth 2 (two) times daily.   omeprazole 40 MG capsule Commonly known as: PRILOSEC Take 40 mg by mouth every morning.   spironolactone 25 MG tablet Commonly known as: ALDACTONE Take 25 mg by mouth daily.   telmisartan 80 MG tablet Commonly known as: MICARDIS Take 80 mg by mouth daily.   Xarelto 15 MG Tabs tablet Generic drug: Rivaroxaban Take 15 mg by mouth daily.         Time coordinating discharge: 31 minutes  Signed:  Haset Oaxaca  [facility] 07/31/2020, 2:07 PM   Pager on [email or web address]. If 7PM-7AM, please contact night-coverage at [email or web address]

## 2020-07-31 NOTE — Progress Notes (Signed)
[place] Hospital Day(s): 3.   Post op day(s):  Marland Kitchen   Interval History: Patient seen and examined, no acute events or new complaints overnight. Patient reports feeling well.  She denies nausea or vomiting.  She denies abdominal pain.  Vital signs in last 24 hours: [min-max] current  Temp:  [97.5 F (36.4 C)-98.1 F (36.7 C)] 97.5 F (36.4 C) (02/05 0741) Pulse Rate:  [45-82] 82 (02/05 0741) Resp:  [16-19] 19 (02/05 0741) BP: (139-182)/(85-89) 182/85 (02/05 0741) SpO2:  [93 %-95 %] 93 % (02/05 0741) Weight:  [81.9 kg] 81.9 kg (02/05 0343)     Height: 5\' 5"  (165.1 cm) Weight: 81.9 kg BMI (Calculated): 30.04   Physical Exam:  Constitutional: alert, cooperative and no distress  Respiratory: breathing non-labored at rest  Cardiovascular: regular rate and sinus rhythm  Gastrointestinal: soft, non-tender, and non-distended  Labs:  CBC Latest Ref Rng & Units 07/30/2020 07/29/2020 07/28/2020  WBC 4.0 - 10.5 K/uL 8.8 9.2 10.7(H)  Hemoglobin 12.0 - 15.0 g/dL 12.6 13.7 16.0(H)  Hematocrit 36.0 - 46.0 % 37.5 40.6 45.5  Platelets 150 - 400 K/uL 106(L) 137(L) 154   CMP Latest Ref Rng & Units 07/30/2020 07/29/2020 07/28/2020  Glucose 70 - 99 mg/dL 107(H) 134(H) 185(H)  BUN 8 - 23 mg/dL 16 16 18   Creatinine 0.44 - 1.00 mg/dL 0.73 0.87 0.89  Sodium 135 - 145 mmol/L 135 138 135  Potassium 3.5 - 5.1 mmol/L 3.9 4.0 4.4  Chloride 98 - 111 mmol/L 98 99 94(L)  CO2 22 - 32 mmol/L 27 28 29   Calcium 8.9 - 10.3 mg/dL 8.7(L) 8.7(L) 9.4  Total Protein 6.5 - 8.1 g/dL - - 7.7  Total Bilirubin 0.3 - 1.2 mg/dL - - 1.2  Alkaline Phos 38 - 126 U/L - - 71  AST 15 - 41 U/L - - 22  ALT 0 - 44 U/L - - 13    Imaging studies: No new pertinent imaging studies   Assessment/Plan:  87 y.o.femalewith small bowel obstruction, complicated by pertinent comorbidities includingA. fib on Xarelto.  Patient resolved mild bowel obstruction.  No nausea or vomiting.  Patient passing gas and having bowel  movement.  The patient tolerated breakfast no contraindication for discharge from surgical standpoint.  Arnold Long, MD

## 2020-07-31 NOTE — Plan of Care (Signed)
  Problem: Education: Goal: Knowledge of General Education information will improve Description: Including pain rating scale, medication(s)/side effects and non-pharmacologic comfort measures 07/31/2020 1448 by Alen Blew, RN Outcome: Adequate for Discharge 07/31/2020 1448 by Alen Blew, RN Outcome: Adequate for Discharge   Problem: Health Behavior/Discharge Planning: Goal: Ability to manage health-related needs will improve 07/31/2020 1448 by Alen Blew, RN Outcome: Adequate for Discharge 07/31/2020 1448 by Alen Blew, RN Outcome: Adequate for Discharge   Problem: Clinical Measurements: Goal: Ability to maintain clinical measurements within normal limits will improve 07/31/2020 1448 by Alen Blew, RN Outcome: Adequate for Discharge 07/31/2020 1448 by Alen Blew, RN Outcome: Adequate for Discharge Goal: Will remain free from infection 07/31/2020 1448 by Alen Blew, RN Outcome: Adequate for Discharge 07/31/2020 1448 by Alen Blew, RN Outcome: Adequate for Discharge Goal: Diagnostic test results will improve 07/31/2020 1448 by Alen Blew, RN Outcome: Adequate for Discharge 07/31/2020 1448 by Alen Blew, RN Outcome: Adequate for Discharge Goal: Respiratory complications will improve 07/31/2020 1448 by Alen Blew, RN Outcome: Adequate for Discharge 07/31/2020 1448 by Alen Blew, RN Outcome: Adequate for Discharge Goal: Cardiovascular complication will be avoided 07/31/2020 1448 by Alen Blew, RN Outcome: Adequate for Discharge 07/31/2020 1448 by Alen Blew, RN Outcome: Adequate for Discharge   Problem: Activity: Goal: Risk for activity intolerance will decrease 07/31/2020 1448 by Alen Blew, RN Outcome: Adequate for Discharge 07/31/2020 1448 by Alen Blew, RN Outcome: Adequate for Discharge   Problem: Nutrition: Goal: Adequate nutrition will be maintained 07/31/2020 1448 by Alen Blew, RN Outcome:  Adequate for Discharge 07/31/2020 1448 by Alen Blew, RN Outcome: Adequate for Discharge   Problem: Safety: Goal: Ability to remain free from injury will improve 07/31/2020 1448 by Alen Blew, RN Outcome: Adequate for Discharge 07/31/2020 1448 by Alen Blew, RN Outcome: Adequate for Discharge

## 2020-08-12 ENCOUNTER — Ambulatory Visit
Admission: RE | Admit: 2020-08-12 | Discharge: 2020-08-12 | Disposition: A | Discharge status: Home / Self Care | Payer: Medicare Other | Source: Ambulatory Visit | Attending: Nurse Practitioner | Admitting: Nurse Practitioner

## 2020-08-12 ENCOUNTER — Other Ambulatory Visit: Payer: Self-pay

## 2020-08-12 DIAGNOSIS — Z1231 Encounter for screening mammogram for malignant neoplasm of breast: Secondary | ICD-10-CM | POA: Insufficient documentation

## 2020-08-20 ENCOUNTER — Ambulatory Visit
Admission: RE | Admit: 2020-08-20 | Discharge: 2020-08-20 | Disposition: A | Discharge status: Home / Self Care | Payer: Medicare Other | Source: Ambulatory Visit | Attending: Gastroenterology | Admitting: Gastroenterology

## 2020-08-20 ENCOUNTER — Other Ambulatory Visit: Payer: Self-pay | Admitting: Gastroenterology

## 2020-08-20 ENCOUNTER — Other Ambulatory Visit: Payer: Self-pay

## 2020-08-20 DIAGNOSIS — R1033 Periumbilical pain: Secondary | ICD-10-CM | POA: Diagnosis not present

## 2020-09-17 ENCOUNTER — Other Ambulatory Visit: Payer: Self-pay

## 2020-09-17 ENCOUNTER — Ambulatory Visit
Admission: EM | Admit: 2020-09-17 | Discharge: 2020-09-17 | Disposition: A | Discharge status: Home / Self Care | Payer: Medicare Other | Attending: Family Medicine | Admitting: Family Medicine

## 2020-09-17 DIAGNOSIS — R58 Hemorrhage, not elsewhere classified: Secondary | ICD-10-CM | POA: Diagnosis not present

## 2020-09-17 NOTE — ED Triage Notes (Signed)
Pt c/o waking this morning with extremely swollen and discolored feet. She is able to walk and denies increased pain. Pt is on anticoagulants.

## 2020-09-17 NOTE — Discharge Instructions (Signed)
Keep a close eye.  If you develop pain, redness, numbness go to the ER.  Take care  Dr. Lacinda Axon

## 2020-09-17 NOTE — ED Provider Notes (Signed)
[facility]    CSN: [id number] Arrival date & time: 09/17/20  1313      History   Chief Complaint Chief Complaint  Patient presents with  . Leg Swelling    BLE  . discoloration to skin    BLE    HPI  87 year old female presents with the above complaints.  Patient reports that she woke up this morning and checked her legs and feet as she normally does.  She states that she noticed discoloration particularly of the distal foot and the toes.  Most notable to the right foot.  She has baseline lower extremity edema.  She states that her edema is slightly worse than her baseline.  She denies any significant pain.  She is able to ambulate.  No claudication symptoms.  She has a history of DVT.  She is on Xarelto.  She does not recall an injury.  She is active.  No other complaints at this time.  Of note, she is followed by vein and vascular.  Last note was reviewed (05/2020)  At that time her ABIs were unchanged and she was doing well.  She had good Doppler flow.  Past Medical History:  Diagnosis Date  . Atrial fibrillation ([place])   . Cancer ([place]) 08/29/2016   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  . Headache    behind right eye  . History of blood clots 07/10/2013   Left lower leg  . PVD (peripheral vascular disease) ([place])   . SVT (supraventricular tachycardia) ([place])   . Wears dentures    full upper, p[artial lower    Patient Active Problem List   Diagnosis Date Noted  . Atrial fibrillation with rapid ventricular response ([place]) 07/28/2020  . CKD (chronic kidney disease) stage 3, GFR 30-59 ml/min (H[facility]) 06/23/2020  . Obesity (BMI 30-39.9) 06/23/2020  . OSA (obstructive sleep apnea) 02/17/2020  . Bilateral leg edema 01/07/2020  . Abdominal pain 09/04/2019  . LVH (left ventricular hypertrophy) due to hypertensive disease, without heart failure 10/01/2018  . Small bowel obstruction ([place]) 09/13/2018  . Atherosclerosis of abdominal aorta ([place]) 08/19/2018  . Family  history of colon cancer 06/29/2016  . Essential hypertension, benign 06/23/2016  . Atrial fibrillation ([place]) 06/23/2016  . PVD (peripheral vascular disease) ([place]) 06/23/2016  . IGT (impaired glucose tolerance) 10/25/2015  . GERD (gastroesophageal reflux disease) 02/17/2014  . Thromboembolism of proximal vein of left lower extremity ([place]) 02/17/2014  . Mixed hyperlipidemia 01/13/2014    Past Surgical History:  Procedure Laterality Date  . ABDOMINAL HYSTERECTOMY    . angiogram legs    . BREAST BIOPSY Left    neg  . BREAST BIOPSY Right    neg  . BREAST BIOPSY Left 07/01/2018   ribbon clip, [place] Bx, neg  . BROW LIFT Bilateral 07/10/2017   Procedure: BLEPHAROPLASTY  UPPER EYELID W EXCESS SKIN;  Surgeon: Karle Starch, MD;  Location: [place];  Service: Ophthalmology;  Laterality: Bilateral;  . cataracts    . COLON RESECTION    . HERNIA REPAIR    . PTOSIS REPAIR Bilateral 07/10/2017   Procedure: PTOSIS REPAIR RESECT EX;  Surgeon: Karle Starch, MD;  Location: [place];  Service: Ophthalmology;  Laterality: Bilateral;    OB History   No obstetric history on file.      Home Medications    Prior to Admission medications   Medication Sig Start Date End Date Taking? Authorizing Provider  amLODipine (NORVASC) 5 MG tablet Take 5 mg  by mouth daily. 08/17/20  Yes [provider]  cyanocobalamin (,VITAMIN B-12,) 1000 MCG/ML injection Inject 1,000 mcg into the muscle every 6 (six) weeks.  11/26/18  Yes [provider]  furosemide (LASIX) 20 MG tablet Take 20 mg by mouth daily.  12/24/18 09/17/20 Yes [provider]  glucosamine-chondroitin 500-400 MG tablet Take 1 tablet by mouth daily.   Yes [provider]  metoprolol tartrate (LOPRESSOR) 25 MG tablet Take 100 mg by mouth 2 (two) times daily.   Yes [provider]  omeprazole (PRILOSEC) 40 MG capsule Take 40 mg by mouth every morning. 07/14/20  Yes [provider]   spironolactone (ALDACTONE) 25 MG tablet Take 25 mg by mouth daily.  10/28/18 09/17/20 Yes [provider]  telmisartan (MICARDIS) 80 MG tablet Take 80 mg by mouth daily.  04/10/19 09/17/20 Yes [provider]  XARELTO 15 MG TABS tablet Take 15 mg by mouth daily. 05/03/20  Yes [provider]  acetaminophen (TYLENOL) 500 MG tablet Take 500-1,000 mg by mouth every 6 (six) hours as needed for mild pain or fever.     [provider]  spironolactone-hydrochlorothiazide (ALDACTAZIDE) 25-25 MG tablet Take 1 tablet by mouth daily.  09/17/20  [provider]    Family History Family History  Problem Relation Age of Onset  . CVA Mother   . CAD Father   . Breast cancer Neg Hx     Social History Social History   Tobacco Use  . Smoking status: Never Smoker  . Smokeless tobacco: Never Used  Vaping Use  . Vaping Use: Never used  Substance Use Topics  . Alcohol use: Yes    Alcohol/week: 14.0 standard drinks    Types: 14 Shots of liquor per week  . Drug use: No     Allergies   Pravastatin and Statins   Review of Systems Review of Systems Per HPI  Physical Exam Triage Vital Signs ED Triage Vitals  Enc Vitals Group     BP 09/17/20 1337 (!) 148/78     Pulse Rate 09/17/20 1337 64     Resp 09/17/20 1337 18     Temp 09/17/20 1337 98.4 F (36.9 C)     Temp Source 09/17/20 1337 Oral     SpO2 09/17/20 1337 97 %     Weight 09/17/20 1333 171 lb (77.6 kg)     Height 09/17/20 1333 5\' 5"  (1.651 m)     Head Circumference --      Peak Flow --      Pain Score 09/17/20 1332 0     Pain Loc --      Pain Edu? --      Excl. in [place]? --    Updated Vital Signs BP (!) 148/78 (BP Location: Left Arm)   Pulse 64   Temp 98.4 F (36.9 C) (Oral)   Resp 18   Ht 5\' 5"  (1.651 m)   Wt 77.6 kg   SpO2 97%   BMI 28.46 kg/m   Visual Acuity Right Eye Distance:   Left Eye Distance:   Bilateral Distance:    Right Eye Near:   Left Eye Near:    Bilateral  Near:     Physical Exam Constitutional:      General: She is not in acute distress.    Appearance: Normal appearance. She is not ill-appearing.  Eyes:     General:        Right eye: No discharge.  Left eye: No discharge.     Conjunctiva/sclera: Conjunctivae normal.  Cardiovascular:     Rate and Rhythm: Rhythm irregularly irregular.  Pulmonary:     Effort: Pulmonary effort is normal. No respiratory distress.  Musculoskeletal:       Feet:  Feet:     Comments: Ecchymosis noted at the labeled locations.  Palpable dorsalis pedis pulse on the right.  Unable to palpate dorsalis pedis pulse on the left.  This is likely due to edema.  Cannot appreciate PT pulses due to edema as well.  Her toes are cool to the touch.  No wounds. Neurological:     Mental Status: She is alert.  Psychiatric:        Mood and Affect: Mood normal.        Behavior: Behavior normal.    UC Treatments / Results  Labs (all labs ordered are listed, but only abnormal results are displayed) Labs Reviewed - No data to display  EKG   Radiology No results found.  Procedures Procedures (including critical care time)  Medications Ordered in UC Medications - No data to display  Initial Impression / Assessment and Plan / UC Course  I have reviewed the triage vital signs and the nursing notes.  Pertinent labs & imaging results that were available during my care of the patient were reviewed by me and considered in my medical decision making (see chart for details).    87 year old female presents for evaluation of the above.  I have reviewed her recent note from vein and vascular.  She had good ABIs and Doppler flow.  I suspect that her exam findings is simply ecchymosis as she is on anticoagulation and is elderly with thin skin.  Advised use of compression stockings and elevation for the edema.  Advised to go to the ER if she worsens.  Final Clinical Impressions(s) / UC Diagnoses   Final diagnoses:   Ecchymosis     Discharge Instructions     Keep a close eye.  If you develop pain, redness, numbness go to the ER.  Take care  Dr. Lacinda Axon    ED Prescriptions    None     PDMP not reviewed this encounter.   Coral Spikes, [place] 09/17/20 1413

## 2020-12-09 ENCOUNTER — Emergency Department: Payer: Medicare Other | Admitting: Certified Registered"

## 2020-12-09 ENCOUNTER — Encounter
Admission: EM | Disposition: A | Discharge status: Other Acute Inpatient Hospital | Payer: Self-pay | Source: Home / Self Care | Attending: Emergency Medicine

## 2020-12-09 ENCOUNTER — Emergency Department: Payer: Medicare Other

## 2020-12-09 ENCOUNTER — Other Ambulatory Visit: Payer: Self-pay

## 2020-12-09 ENCOUNTER — Emergency Department
Admission: EM | Admit: 2020-12-09 | Discharge: 2020-12-09 | Payer: Medicare Other | Attending: Emergency Medicine | Admitting: Emergency Medicine

## 2020-12-09 DIAGNOSIS — W01198A Fall on same level from slipping, tripping and stumbling with subsequent striking against other object, initial encounter: Secondary | ICD-10-CM | POA: Insufficient documentation

## 2020-12-09 DIAGNOSIS — Z7901 Long term (current) use of anticoagulants: Secondary | ICD-10-CM | POA: Insufficient documentation

## 2020-12-09 DIAGNOSIS — Z79899 Other long term (current) drug therapy: Secondary | ICD-10-CM | POA: Insufficient documentation

## 2020-12-09 DIAGNOSIS — Y9301 Activity, walking, marching and hiking: Secondary | ICD-10-CM | POA: Insufficient documentation

## 2020-12-09 DIAGNOSIS — Z853 Personal history of malignant neoplasm of breast: Secondary | ICD-10-CM | POA: Insufficient documentation

## 2020-12-09 DIAGNOSIS — I129 Hypertensive chronic kidney disease with stage 1 through stage 4 chronic kidney disease, or unspecified chronic kidney disease: Secondary | ICD-10-CM | POA: Diagnosis not present

## 2020-12-09 DIAGNOSIS — N183 Chronic kidney disease, stage 3 unspecified: Secondary | ICD-10-CM | POA: Diagnosis not present

## 2020-12-09 DIAGNOSIS — S0990XA Unspecified injury of head, initial encounter: Secondary | ICD-10-CM | POA: Diagnosis present

## 2020-12-09 DIAGNOSIS — S065X0A Traumatic subdural hemorrhage without loss of consciousness, initial encounter: Secondary | ICD-10-CM | POA: Insufficient documentation

## 2020-12-09 DIAGNOSIS — S065XAA Traumatic subdural hemorrhage with loss of consciousness status unknown, initial encounter: Secondary | ICD-10-CM

## 2020-12-09 HISTORY — PX: CRANIOTOMY: SHX93

## 2020-12-09 LAB — COMPREHENSIVE METABOLIC PANEL
ALT: 11 U/L (ref 0–44)
AST: 22 U/L (ref 15–41)
Albumin: 4.7 g/dL (ref 3.5–5.0)
Alkaline Phosphatase: 74 U/L (ref 38–126)
Anion gap: 10 (ref 5–15)
BUN: 18 mg/dL (ref 8–23)
CO2: 26 mmol/L (ref 22–32)
Calcium: 9.3 mg/dL (ref 8.9–10.3)
Chloride: 95 mmol/L — ABNORMAL LOW (ref 98–111)
Creatinine, Ser: 1.13 mg/dL — ABNORMAL HIGH (ref 0.44–1.00)
GFR, Estimated: 47 mL/min — ABNORMAL LOW (ref >60)
Glucose, Bld: 166 mg/dL — ABNORMAL HIGH (ref 70–99)
Potassium: 4.2 mmol/L (ref 3.5–5.1)
Sodium: 131 mmol/L — ABNORMAL LOW (ref 135–145)
Total Bilirubin: 1.3 mg/dL — ABNORMAL HIGH (ref 0.3–1.2)
Total Protein: 7.7 g/dL (ref 6.5–8.1)

## 2020-12-09 LAB — CBC WITH DIFFERENTIAL/PLATELET
Abs Immature Granulocytes: 0.04 10*3/uL (ref 0.00–0.07)
Basophils Absolute: 0.1 10*3/uL (ref 0.0–0.1)
Basophils Relative: 0 %
Eosinophils Absolute: 0 10*3/uL (ref 0.0–0.5)
Eosinophils Relative: 0 %
HCT: 42.6 % (ref 36.0–46.0)
Hemoglobin: 14.5 g/dL (ref 12.0–15.0)
Immature Granulocytes: 0 %
Lymphocytes Relative: 8 %
Lymphs Abs: 1 10*3/uL (ref 0.7–4.0)
MCH: 31.1 pg (ref 26.0–34.0)
MCHC: 34 g/dL (ref 30.0–36.0)
MCV: 91.4 fL (ref 80.0–100.0)
Monocytes Absolute: 0.4 10*3/uL (ref 0.1–1.0)
Monocytes Relative: 3 %
Neutro Abs: 10.2 10*3/uL — ABNORMAL HIGH (ref 1.7–7.7)
Neutrophils Relative %: 89 %
Platelets: 182 10*3/uL (ref 150–400)
RBC: 4.66 MIL/uL (ref 3.87–5.11)
RDW: 12.7 % (ref 11.5–15.5)
WBC: 11.7 10*3/uL — ABNORMAL HIGH (ref 4.0–10.5)
nRBC: 0 % (ref 0.0–0.2)

## 2020-12-09 LAB — PROTIME-INR
INR: 1.8 — ABNORMAL HIGH (ref 0.8–1.2)
Prothrombin Time: 21.3 seconds — ABNORMAL HIGH (ref 11.4–15.2)

## 2020-12-09 SURGERY — CRANIOTOMY HEMATOMA EVACUATION SUBDURAL
Anesthesia: General | Laterality: Right

## 2020-12-09 MED ORDER — FENTANYL CITRATE (PF) 100 MCG/2ML IJ SOLN
75.0000 ug | Freq: Once | INTRAMUSCULAR | Status: AC
  Administered 2020-12-09: 75 ug via INTRAVENOUS

## 2020-12-09 MED ORDER — FENTANYL CITRATE (PF) 100 MCG/2ML IJ SOLN
INTRAMUSCULAR | Status: AC
  Filled 2020-12-09: qty 2

## 2020-12-09 MED ORDER — PROTHROMBIN COMPLEX CONC HUMAN 500 UNITS IV KIT
3835.0000 [IU] | PACK | Status: AC
  Administered 2020-12-09: 3835 [IU] via INTRAVENOUS
  Filled 2020-12-09: qty 3335

## 2020-12-09 MED ORDER — LEVETIRACETAM 500 MG/5ML IV SOLN
INTRAVENOUS | Status: AC
  Filled 2020-12-09: qty 5

## 2020-12-09 MED ORDER — CEFAZOLIN SODIUM-DEXTROSE 1-4 GM/50ML-% IV SOLN
INTRAVENOUS | Status: DC | PRN
  Administered 2020-12-09: 2 g via INTRAVENOUS

## 2020-12-09 MED ORDER — PHENYLEPHRINE HCL-NACL 10-0.9 MG/250ML-% IV SOLN
0.0000 ug/min | INTRAVENOUS | Status: DC
  Administered 2020-12-09: 20 ug/min via INTRAVENOUS
  Filled 2020-12-09: qty 250

## 2020-12-09 MED ORDER — NOREPINEPHRINE 4 MG/250ML-% IV SOLN
INTRAVENOUS | Status: AC
  Filled 2020-12-09: qty 250

## 2020-12-09 MED ORDER — MANNITOL 20 % IV SOLN
INTRAVENOUS | Status: AC
  Filled 2020-12-09: qty 500

## 2020-12-09 MED ORDER — MANNITOL 20 % IV SOLN
50.0000 g | Freq: Once | Status: AC
  Administered 2020-12-09: 50 g via INTRAVENOUS
  Filled 2020-12-09: qty 250

## 2020-12-09 MED ORDER — PROPOFOL 500 MG/50ML IV EMUL
INTRAVENOUS | Status: AC
  Filled 2020-12-09: qty 50

## 2020-12-09 MED ORDER — PROPOFOL 1000 MG/100ML IV EMUL
INTRAVENOUS | Status: AC
  Administered 2020-12-09: 10 ug/kg/min via INTRAVENOUS
  Filled 2020-12-09: qty 100

## 2020-12-09 MED ORDER — PHENYLEPHRINE HCL (PRESSORS) 10 MG/ML IV SOLN
INTRAVENOUS | Status: DC | PRN
  Administered 2020-12-09: 100 ug via INTRAVENOUS
  Administered 2020-12-09: 200 ug via INTRAVENOUS
  Administered 2020-12-09 (×4): 100 ug via INTRAVENOUS

## 2020-12-09 MED ORDER — ONDANSETRON HCL 4 MG/2ML IJ SOLN
INTRAMUSCULAR | Status: AC
  Filled 2020-12-09: qty 2

## 2020-12-09 MED ORDER — PHENYLEPHRINE HCL (PRESSORS) 10 MG/ML IV SOLN
INTRAVENOUS | Status: AC
  Filled 2020-12-09: qty 1

## 2020-12-09 MED ORDER — LEVETIRACETAM IN NACL 500 MG/100ML IV SOLN
500.0000 mg | Freq: Two times a day (BID) | INTRAVENOUS | Status: DC
  Administered 2020-12-09: 500 mg via INTRAVENOUS
  Filled 2020-12-09 (×2): qty 100

## 2020-12-09 MED ORDER — FENTANYL CITRATE (PF) 100 MCG/2ML IJ SOLN
INTRAMUSCULAR | Status: DC | PRN
  Administered 2020-12-09 (×2): 50 ug via INTRAVENOUS

## 2020-12-09 MED ORDER — PROPOFOL 1000 MG/100ML IV EMUL: 5.0000 ug/kg/min | INTRAVENOUS | Status: DC

## 2020-12-09 MED ORDER — PROPOFOL 10 MG/ML IV BOLUS
INTRAVENOUS | Status: DC | PRN
  Administered 2020-12-09: 100 mg via INTRAVENOUS
  Administered 2020-12-09: 50 mg via INTRAVENOUS

## 2020-12-09 MED ORDER — LABETALOL HCL 5 MG/ML IV SOLN
INTRAVENOUS | Status: AC
  Filled 2020-12-09: qty 4

## 2020-12-09 MED ORDER — ONDANSETRON HCL 4 MG/2ML IJ SOLN
4.0000 mg | Freq: Once | INTRAMUSCULAR | Status: AC
  Administered 2020-12-09: 4 mg via INTRAVENOUS

## 2020-12-09 MED ORDER — LIDOCAINE HCL (PF) 2 % IJ SOLN
INTRAMUSCULAR | Status: AC
  Filled 2020-12-09: qty 5

## 2020-12-09 MED ORDER — SODIUM CHLORIDE 0.9 % IV SOLN: INTRAVENOUS | Status: DC | PRN

## 2020-12-09 MED ORDER — NICARDIPINE HCL IN NACL 20-0.86 MG/200ML-% IV SOLN
3.0000 mg/h | INTRAVENOUS | Status: DC
  Administered 2020-12-09: 5 mg/h via INTRAVENOUS
  Filled 2020-12-09: qty 200

## 2020-12-09 MED ORDER — SODIUM CHLORIDE 0.9 % IV SOLN
INTRAVENOUS | Status: DC | PRN
  Administered 2020-12-09 (×2): 20 ug/min via INTRAVENOUS

## 2020-12-09 MED ORDER — BACITRACIN 500 UNIT/GM EX OINT
TOPICAL_OINTMENT | CUTANEOUS | Status: DC | PRN
  Administered 2020-12-09: 1 via TOPICAL

## 2020-12-09 MED ORDER — HEMOSTATIC AGENTS (NO CHARGE) OPTIME
TOPICAL | Status: DC | PRN
  Administered 2020-12-09: 1 via TOPICAL

## 2020-12-09 MED ORDER — ROCURONIUM BROMIDE 100 MG/10ML IV SOLN
INTRAVENOUS | Status: DC | PRN
  Administered 2020-12-09 (×2): 20 mg via INTRAVENOUS
  Administered 2020-12-09: 50 mg via INTRAVENOUS

## 2020-12-09 MED ORDER — ROCURONIUM BROMIDE 10 MG/ML (PF) SYRINGE
PREFILLED_SYRINGE | INTRAVENOUS | Status: AC
  Filled 2020-12-09: qty 10

## 2020-12-09 MED ORDER — PROPOFOL 500 MG/50ML IV EMUL
INTRAVENOUS | Status: AC
  Filled 2020-12-09: qty 100

## 2020-12-09 MED ORDER — SUCCINYLCHOLINE CHLORIDE 20 MG/ML IJ SOLN
1.5000 mg/kg | Freq: Once | INTRAMUSCULAR | Status: AC
  Administered 2020-12-09: 115.6 mg via INTRAVENOUS

## 2020-12-09 MED ORDER — PROPOFOL 10 MG/ML IV BOLUS
INTRAVENOUS | Status: AC
  Filled 2020-12-09: qty 20

## 2020-12-09 MED ORDER — ETOMIDATE 2 MG/ML IV SOLN
10.0000 mg | Freq: Once | INTRAVENOUS | Status: AC
  Administered 2020-12-09: 10 mg via INTRAVENOUS

## 2020-12-09 MED ORDER — ONDANSETRON HCL 4 MG/2ML IJ SOLN
INTRAMUSCULAR | Status: DC | PRN
  Administered 2020-12-09: 4 mg via INTRAVENOUS

## 2020-12-09 MED ORDER — LIDOCAINE-EPINEPHRINE 1 %-1:100000 IJ SOLN
INTRAMUSCULAR | Status: DC | PRN
  Administered 2020-12-09: 10 mL

## 2020-12-09 SURGICAL SUPPLY — 68 items
APPLICATOR CHLORAPREP 10.5 ORG (MISCELLANEOUS) ×4 IMPLANT
BASIN GRAD PLASTIC 32OZ STRL (MISCELLANEOUS) ×2 IMPLANT
BIT DRILL WIRE PASS 1.3MM (BIT) ×1 IMPLANT
BLADE CLIPPER SPEC (BLADE) ×2 IMPLANT
BLADE SURG 15 STRL LF DISP TIS (BLADE) ×1 IMPLANT
BLADE SURG 15 STRL SS (BLADE) ×1
BULB RESERV EVAC DRAIN JP 100C (MISCELLANEOUS) ×2 IMPLANT
BUR ACORN 7.5 PRECISION (BURR) ×2 IMPLANT
BUR SPIRAL ROUTER 2.3 (BUR) IMPLANT
CNTNR SPEC 2.5X3XGRAD LEK (MISCELLANEOUS) ×3
CONT SPEC 4OZ STER OR WHT (MISCELLANEOUS) ×3
CONTAINER SPEC 2.5X3XGRAD LEK (MISCELLANEOUS) ×3 IMPLANT
COUNTER NEEDLE 20/40 LG (NEEDLE) ×2 IMPLANT
COVER WAND RF STERILE (DRAPES) IMPLANT
DRAIN CHANNEL JP 10F RND 20C F (MISCELLANEOUS) IMPLANT
DRAIN JP 10F RND SILICONE (MISCELLANEOUS) ×2 IMPLANT
DRAPE INCISE 23X17 IOBAN STRL (DRAPES) ×2
DRAPE INCISE IOBAN 23X17 STRL (DRAPES) ×2 IMPLANT
DRAPE INCISE IOBAN 66X45 STRL (DRAPES) ×2 IMPLANT
DRAPE SURG 17X11 SM STRL (DRAPES) ×2 IMPLANT
DRAPE WARM FLUID 44X44 (DRAPES) ×2 IMPLANT
DRILL WIRE PASS 1.3MM (BIT) ×2
DRSG TEGADERM 4X4.75 (GAUZE/BANDAGES/DRESSINGS) ×2 IMPLANT
ELECT CAUTERY BLADE TIP 2.5 (TIP) ×2
ELECT REM PT RETURN 9FT ADLT (ELECTROSURGICAL) ×2
ELECTRODE CAUTERY BLDE TIP 2.5 (TIP) ×1 IMPLANT
ELECTRODE REM PT RTRN 9FT ADLT (ELECTROSURGICAL) ×1 IMPLANT
GAUZE XEROFORM 1X8 LF (GAUZE/BANDAGES/DRESSINGS) ×2 IMPLANT
GLOVE SURG SYN 8.5  E (GLOVE) ×8
GLOVE SURG SYN 8.5 E (GLOVE) ×8 IMPLANT
GLOVE SURG UNDER POLY LF SZ8.5 (GLOVE) IMPLANT
GOWN SRG XL LVL 3 NONREINFORCE (GOWNS) ×1 IMPLANT
GOWN STRL NON-REIN TWL XL LVL3 (GOWNS) ×1
GRADUATE 1200CC STRL 31836 (MISCELLANEOUS) ×2 IMPLANT
GRAFT DURAGEN MATRIX 3WX3L (Graft) ×1 IMPLANT
GRAFT DURAGEN MATRIX 3X3 SNGL (Graft) ×1 IMPLANT
HEMOSTAT SURGICEL 2X14 (HEMOSTASIS) IMPLANT
HEMOSTAT SURGICEL 2X3 (HEMOSTASIS) IMPLANT
HEMOSTAT SURGICEL 4X8 (HEMOSTASIS) ×2 IMPLANT
HOOK STAY BLUNT/RETRACTOR 5M (MISCELLANEOUS) ×2 IMPLANT
KIT TURNOVER KIT A (KITS) ×2 IMPLANT
MANIFOLD NEPTUNE II (INSTRUMENTS) ×2 IMPLANT
MARKER SKIN DUAL TIP RULER LAB (MISCELLANEOUS) ×6 IMPLANT
MAT ABSORB  FLUID 56X50 GRAY (MISCELLANEOUS) ×1
MAT ABSORB FLUID 56X50 GRAY (MISCELLANEOUS) ×1 IMPLANT
NEEDLE HYPO 22GX1.5 SAFETY (NEEDLE) ×2 IMPLANT
PACK CRANIOTOMY CUSTOM (CUSTOM PROCEDURE TRAY) ×2 IMPLANT
PAD ARMBOARD 7.5X6 YLW CONV (MISCELLANEOUS) IMPLANT
PIN MAYFIELD SKULL DISP (PIN) ×2 IMPLANT
PLATE 1.5/0.5 18.5MM BURR HOLE (Plate) ×8 IMPLANT
SCREW SELF DRILL HT 1.5/4MM (Screw) ×24 IMPLANT
SET CATH VENT DRAIN 3-15 1.9D (DRAIN) IMPLANT
SHEET NEURO XL SOL CTL (MISCELLANEOUS) ×2 IMPLANT
SOL PREP PROV IODINE SCRUB 4OZ (MISCELLANEOUS) ×2 IMPLANT
SOL PREP PVP 2OZ (MISCELLANEOUS) ×2
SOLUTION PREP PVP 2OZ (MISCELLANEOUS) ×1 IMPLANT
SPOGE SURGIFLO 8M (HEMOSTASIS) ×1
SPONGE SURGIFLO 8M (HEMOSTASIS) ×1 IMPLANT
STAPLER SKIN PROX 35W (STAPLE) ×4 IMPLANT
SURGILUBE 2OZ TUBE FLIPTOP (MISCELLANEOUS) IMPLANT
SUT NURALON 4 0 TR CR/8 (SUTURE) ×4 IMPLANT
SUT VIC AB 2-0 CT1 18 (SUTURE) ×4 IMPLANT
SYR 10ML LL (SYRINGE) ×2 IMPLANT
SYR 20ML LL LF (SYRINGE) ×4 IMPLANT
TAPE CLOTH 3X10 WHT NS LF (GAUZE/BANDAGES/DRESSINGS) ×2 IMPLANT
TOWEL OR 17X26 4PK STRL BLUE (TOWEL DISPOSABLE) ×8 IMPLANT
TRAY FOLEY SLVR 16FR TEMP STAT (SET/KITS/TRAYS/PACK) ×2 IMPLANT
WATER STERILE IRR 1000ML POUR (IV SOLUTION) IMPLANT

## 2020-12-09 NOTE — ED Notes (Signed)
Report given to [employer], OR RN

## 2020-12-09 NOTE — Transfer of Care (Signed)
Immediate Anesthesia Transfer of Care Note  Patient: Tiffany Shaffer  Procedure(s) Performed: CRANIOTOMY HEMATOMA EVACUATION SUBDURAL (Right)  Patient Location: PACU  Anesthesia Type:General  Level of Consciousness: Patient remains intubated per anesthesia plan  Airway & Oxygen Therapy: Patient remains intubated per anesthesia plan and Patient placed on Ventilator (see vital sign flow sheet for setting)  Post-op Assessment: Report given to RN  Post vital signs: stable  Last Vitals:  Vitals Value Taken Time  BP 124/62 12/09/20 1735  Temp    Pulse 81 12/09/20 1738  Resp 15 12/09/20 1743  SpO2 100 % 12/09/20 1738  Vitals shown include unvalidated device data.  Last Pain:  Vitals:   12/09/20 1250  TempSrc: Oral         Complications: No notable events documented.

## 2020-12-09 NOTE — Consult Note (Signed)
Referring Physician:  No referring provider defined for this encounter.  Primary Physician:  Sallee Lange, NP  Chief Complaint:  altered mental status  History of Present Illness: 12/09/2020 Tiffany Shaffer is a 87 y.o. female who presents with the chief complaint of altered mental status.  She had a fall last night.  She takes therapeutic anticoagulation for atrial fibrillation.  She has been reversed with Kcentra.    She has worsened since arriving at the ER. She has become less responsive over time.  She is unable to give her complete history, but is normally independently living at home.   Review of Systems:  A 10 point review of systems is negative, except for the pertinent positives and negatives detailed in the HPI.  Past Medical History: Past Medical History:  Diagnosis Date   Atrial fibrillation ([place])    Cancer ([place]) 08/29/2016   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   Headache    behind right eye   History of blood clots 07/10/2013   Left lower leg   PVD (peripheral vascular disease) ([facility])    SVT (supraventricular tachycardia) ([facility])    Wears dentures    full upper, p[artial lower    Past Surgical History: Past Surgical History:  Procedure Laterality Date   ABDOMINAL HYSTERECTOMY     angiogram legs     BREAST BIOPSY Left    neg   BREAST BIOPSY Right    neg   BREAST BIOPSY Left 07/01/2018   ribbon clip, [place] Bx, neg   BROW LIFT Bilateral 07/10/2017   Procedure: BLEPHAROPLASTY  UPPER EYELID W EXCESS SKIN;  Surgeon: Karle Starch, MD;  Location: [place];  Service: Ophthalmology;  Laterality: Bilateral;   cataracts     COLON RESECTION     HERNIA REPAIR     PTOSIS REPAIR Bilateral 07/10/2017   Procedure: PTOSIS REPAIR RESECT EX;  Surgeon: Karle Starch, MD;  Location: [place];  Service: Ophthalmology;  Laterality: Bilateral;    Allergies: Allergies as of 12/09/2020 - Review Complete 12/09/2020  Allergen Reaction Noted    Pravastatin Other (See Comments) 07/27/2015   Statins  07/28/2020    Medications:  Current Facility-Administered Medications:    mannitol 20 % IVPB 50 g, 50 g, Intravenous, Once, Meade Maw, MD   nicardipine (CARDENE) 20mg  in 0.86% saline 253ml IV infusion (0.1 mg/ml), 3-15 mg/hr, Intravenous, Continuous, Duffy Bruce, MD, Last Rate: 50 mL/hr at 12/09/20 1344, 5 mg/hr at 12/09/20 1344   ondansetron (ZOFRAN) injection 4 mg, 4 mg, Intravenous, Once, Duffy Bruce, MD   prothrombin complex conc human (KCENTRA) IVPB 3,835 Units, 3,835 Units, Intravenous, STAT, Duffy Bruce, MD, Last Rate: 336 mL/hr at 12/09/20 1404, 3,835 Units at 12/09/20 1404  Current Outpatient Medications:    acetaminophen (TYLENOL) 500 MG tablet, Take 500-1,000 mg by mouth every 6 (six) hours as needed for mild pain or fever. , Disp: , Rfl:    amLODipine (NORVASC) 5 MG tablet, Take 5 mg by mouth daily., Disp: , Rfl:    cyanocobalamin (,VITAMIN B-12,) 1000 MCG/ML injection, Inject 1,000 mcg into the muscle every 6 (six) weeks. , Disp: , Rfl:    furosemide (LASIX) 20 MG tablet, Take 20 mg by mouth daily. , Disp: , Rfl:    glucosamine-chondroitin 500-400 MG tablet, Take 1 tablet by mouth daily., Disp: , Rfl:    metoprolol tartrate (LOPRESSOR) 25 MG tablet, Take 100 mg by mouth 2 (two) times daily., Disp: , Rfl:  omeprazole (PRILOSEC) 40 MG capsule, Take 40 mg by mouth every morning., Disp: , Rfl:    spironolactone (ALDACTONE) 25 MG tablet, Take 25 mg by mouth daily. , Disp: , Rfl:    telmisartan (MICARDIS) 80 MG tablet, Take 80 mg by mouth daily. , Disp: , Rfl:    XARELTO 15 MG TABS tablet, Take 15 mg by mouth daily., Disp: , Rfl:    Social History: Social History   Tobacco Use   Smoking status: Never   Smokeless tobacco: Never  Vaping Use   Vaping Use: Never used  Substance Use Topics   Alcohol use: Yes    Alcohol/week: 14.0 standard drinks    Types: 14 Shots of liquor per week   Drug use: No     Family Medical History: Family History  Problem Relation Age of Onset   CVA Mother    CAD Father    Breast cancer Neg Hx     Physical Examination: Vitals:   12/09/20 1330 12/09/20 1355  BP: (!) 181/100 (!) 160/89  Pulse: (!) 113 (!) 114  Resp: 19 (!) 23  SpO2: 92% 93%     General: Patient is well developed, well nourished. She is nauseated and in distress.   Psychiatric: Patient is anxious.  Head:  Pupils equal, round, and reactive to light.  ENT:  Oral mucosa appears well hydrated.  Neck:   Supple.  Full range of motion.  Respiratory: Patient is breathing without any difficulty but is hypoxic.  Extremities: No edema.  Vascular: Palpable pulses in dorsal pedal vessels.  Skin:   On exposed skin, there are no abnormal skin lesions.  NEUROLOGICAL:  General: In distress.   OE stimulation, FC, does regard.  She is oriented to self and place with choices.    Pupils equal round and reactive to light.  Facial tone is symmetric.  Tongue protrusion is midline.  There is left pronator drift.  She is uncooperative with full strength examination, but moves the R side well and L side antigravity   Sensory examination is unreliable.   Gait untested. Reflexes 2+  Imaging: HCT 12/09/20   IMPRESSION: 1. Large right hemispheric subdural hematoma with 13 mm midline shift to the left. Recommend urgent neuro surgical consultation. Small left frontal subdural hematoma over the convexity 2. Negative for cervical spine fracture 3. These results were called by telephone at the time of interpretation on 12/09/2020 at 1:36 pm to provider Duffy Bruce , who verbally acknowledged these results.     Electronically Signed   By: Franchot Gallo M.D.   On: 12/09/2020 13:37  I have personally reviewed the images and agree with the above interpretation.  Labs: CBC Latest Ref Rng & Units 12/09/2020 07/30/2020 07/29/2020  WBC 4.0 - 10.5 K/uL 11.7(H) 8.8 9.2  Hemoglobin 12.0 - 15.0  g/dL 14.5 12.6 13.7  Hematocrit 36.0 - 46.0 % 42.6 37.5 40.6  Platelets 150 - 400 K/uL 182 106(L) 137(L)       Assessment and Plan: [name] is a pleasant 87 y.o. female with acute R subdural hematoma.  She is GCS E2V3M6=11.  She is worsening actively.    She will not survive without operative intervention given the amount of brain compression, altered mental status, and abruptly worsening status.  She is about to be intubated due to worsening status.  I recommended keppra 500 mg and mannitol 50 gm.    I recommended craniotomy for hematoma evacuation.  I discussed the planned procedure at length with  the patient's daughter, including the risks, benefits, alternatives, and indications. The risks discussed include but are not limited to bleeding, infection, need for reoperation, spinal fluid leak, stroke, vision loss, anesthetic complication, coma, paralysis, and even death. I also described in detail that improvement was not guaranteed.  The patient is unable to give consent given the urgent situation.  This is an emergency case.  I recommended proceeding and the daughter has asked [place] to proceed.   She understands that this is a life-saving surgery.   Lianni Kanaan K. Izora Ribas MD, [facility] of Neuro[facility]  Level 5 consult - patient unable to provide history

## 2020-12-09 NOTE — ED Notes (Signed)
[facility]  transfer  center  called  per DR  Ellender Hose  MD

## 2020-12-09 NOTE — Anesthesia Postprocedure Evaluation (Signed)
Anesthesia Post Note  Patient: Tiffany Shaffer  Procedure(s) Performed: CRANIOTOMY HEMATOMA EVACUATION SUBDURAL (Right)  Patient location during evaluation: PACU Anesthesia Type: General Level of consciousness: sedated Pain management: pain level controlled Vital Signs Assessment: post-procedure vital signs reviewed and stable Respiratory status: patient remains intubated per anesthesia plan Cardiovascular status: stable Postop Assessment: no apparent nausea or vomiting Anesthetic complications: no Comments: Transferring patient to [facility] via life flight   No notable events documented.   Last Vitals:  Vitals:   12/09/20 1750 12/09/20 1800  BP: 107/78 (!) 97/54  Pulse:  67  Resp: 15 15  Temp:    SpO2: 97% 97%    Last Pain:  Vitals:   12/09/20 1735  TempSrc:   PainSc: Asleep                 Precious Haws Nigel Wessman

## 2020-12-09 NOTE — Progress Notes (Signed)
[facility] life flight at bedside. Patient stable for transfer.  Leaving on propofol gtt.  NEO gtt remains off.  VSS on transfer.

## 2020-12-09 NOTE — ED Provider Notes (Signed)
[facility] Emergency Department Provider Note  ____________________________________________   Event Date/Time   First MD Initiated Contact with Patient 12/09/20 1255     (approximate)  I have reviewed the triage vital signs and the nursing notes.   HISTORY  Chief Complaint Fall and Head Injury    HPI Tiffany Shaffer is a 87 y.o. female here with fall and altered mental status.  Patient reportedly fell while walking last night.  Patient woke up today with complaint of headache and dizziness with some nausea and vomiting.  She was slightly confused.  She had weakness of her left side.  She subsequent brought into the ED for evaluation.  On arrival, patient confused but alert.  She complains of nausea.  She states she feels "horrible" and has a diffuse headache.  Remainder of history limited due to confusion and apparent distress.    Past Medical History:  Diagnosis Date   Atrial fibrillation ([place])    Cancer ([place]) 08/29/2016   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   Headache    behind right eye   History of blood clots 07/10/2013   Left lower leg   PVD (peripheral vascular disease) (HCC)    SVT (supraventricular tachycardia) ([place])    Wears dentures    full upper, p[artial lower    Patient Active Problem List   Diagnosis Date Noted   Atrial fibrillation with rapid ventricular response ([place]) 07/28/2020   CKD (chronic kidney disease) stage 3, GFR 30-59 ml/min (HCC) 06/23/2020   Obesity (BMI 30-39.9) 06/23/2020   OSA (obstructive sleep apnea) 02/17/2020   Bilateral leg edema 01/07/2020   Abdominal pain 09/04/2019   LVH (left ventricular hypertrophy) due to hypertensive disease, without heart failure 10/01/2018   Small bowel obstruction ([place]) 09/13/2018   Atherosclerosis of abdominal aorta ([place]) 08/19/2018   Family history of colon cancer 06/29/2016   Essential hypertension, benign 06/23/2016   Atrial fibrillation ([place]) 06/23/2016   PVD (peripheral  vascular disease) ([place]) 06/23/2016   IGT (impaired glucose tolerance) 10/25/2015   GERD (gastroesophageal reflux disease) 02/17/2014   Thromboembolism of proximal vein of left lower extremity ([place]) 02/17/2014   Mixed hyperlipidemia 01/13/2014    Past Surgical History:  Procedure Laterality Date   ABDOMINAL HYSTERECTOMY     angiogram legs     BREAST BIOPSY Left    neg   BREAST BIOPSY Right    neg   BREAST BIOPSY Left 07/01/2018   ribbon clip, [place] Bx, neg   BROW LIFT Bilateral 07/10/2017   Procedure: BLEPHAROPLASTY  UPPER EYELID W EXCESS SKIN;  Surgeon: Karle Starch, MD;  Location: [place];  Service: Ophthalmology;  Laterality: Bilateral;   cataracts     COLON RESECTION     HERNIA REPAIR     PTOSIS REPAIR Bilateral 07/10/2017   Procedure: PTOSIS REPAIR RESECT EX;  Surgeon: Karle Starch, MD;  Location: [place];  Service: Ophthalmology;  Laterality: Bilateral;    Prior to Admission medications   Medication Sig Start Date End Date Taking? Authorizing Provider  acetaminophen (TYLENOL) 500 MG tablet Take 500-1,000 mg by mouth every 6 (six) hours as needed for mild pain or fever.     [provider]  amLODipine (NORVASC) 5 MG tablet Take 5 mg by mouth daily. 08/17/20   [provider]  cyanocobalamin (,VITAMIN B-12,) 1000 MCG/ML injection Inject 1,000 mcg into the muscle every 6 (six) weeks.  11/26/18   [provider]  furosemide (LASIX) 20 MG  tablet Take 20 mg by mouth daily.  12/24/18 09/17/20  [provider]  glucosamine-chondroitin 500-400 MG tablet Take 1 tablet by mouth daily.    [provider]  metoprolol tartrate (LOPRESSOR) 25 MG tablet Take 100 mg by mouth 2 (two) times daily.    [provider]  omeprazole (PRILOSEC) 40 MG capsule Take 40 mg by mouth every morning. 07/14/20   [provider]  spironolactone (ALDACTONE) 25 MG tablet Take 25 mg by mouth daily.  10/28/18 09/17/20  [provider]  telmisartan (MICARDIS) 80 MG tablet Take 80 mg by mouth daily.  04/10/19 09/17/20  [provider]  XARELTO 15 MG TABS tablet Take 15 mg by mouth daily. 05/03/20   [provider]  spironolactone-hydrochlorothiazide (ALDACTAZIDE) 25-25 MG tablet Take 1 tablet by mouth daily.  09/17/20  [provider]    Allergies Pravastatin and Statins  Family History  Problem Relation Age of Onset   CVA Mother    CAD Father    Breast cancer Neg Hx     Social History Social History   Tobacco Use   Smoking status: Never   Smokeless tobacco: Never  Vaping Use   Vaping Use: Never used  Substance Use Topics   Alcohol use: Yes    Alcohol/week: 14.0 standard drinks    Types: 14 Shots of liquor per week   Drug use: No    Review of Systems  Review of Systems  Unable to perform ROS: Mental status change    ____________________________________________  PHYSICAL EXAM:      VITAL SIGNS: ED Triage Vitals  Enc Vitals Group     BP 12/09/20 1250 (!) 201/116     Pulse Rate 12/09/20 1250 98     Resp 12/09/20 1250 20     Temp --      Temp Source 12/09/20 1250 Oral     SpO2 12/09/20 1250 95 %     Weight 12/09/20 1239 170 lb (77.1 kg)     Height 12/09/20 1239 5\' 5"  (1.651 m)     Head Circumference --      Peak Flow --      Pain Score --      Pain Loc --      Pain Edu? --      Excl. in [place]? --      Physical Exam Vitals and nursing note reviewed.  Constitutional:      General: She is not in acute distress.    Appearance: She is well-developed.  HENT:     Head: Normocephalic and atraumatic.  Eyes:     Conjunctiva/sclera: Conjunctivae normal.  Neck:     Comments: No apparent midline or paraspinal tenderness Cardiovascular:     Rate and Rhythm: Normal rate and regular rhythm.     Heart sounds: Normal heart sounds. No murmur heard.   No friction rub.  Pulmonary:     Effort: Pulmonary effort is normal. No respiratory distress.     Breath sounds: Normal  breath sounds. No wheezing or rales.  Abdominal:     General: There is no distension.     Palpations: Abdomen is soft.     Tenderness: There is no abdominal tenderness.  Musculoskeletal:     Cervical back: Neck supple.  Skin:    General: Skin is warm.     Capillary Refill: Capillary refill takes less than 2 seconds.  Neurological:     Mental Status: She is alert. She is disoriented and  confused.     Sensory: Sensation is intact.     Motor: No abnormal muscle tone.     Comments: Strength 4-5 on the left upper and lower extremities.  5 out of 5 in the right upper and lower extremities.  Sensation grossly intact. Drowsy, but answers questions.  Follows commands.  Cranial nerves intact.      ____________________________________________   LABS (all labs ordered are listed, but only abnormal results are displayed)  Labs Reviewed  CBC WITH DIFFERENTIAL/PLATELET - Abnormal; Notable for the following components:      Result Value   WBC 11.7 (*)    Neutro Abs 10.2 (*)    All other components within normal limits  COMPREHENSIVE METABOLIC PANEL - Abnormal; Notable for the following components:   Sodium 131 (*)    Chloride 95 (*)    Glucose, Bld 166 (*)    Creatinine, Ser 1.13 (*)    Total Bilirubin 1.3 (*)    GFR, Estimated 47 (*)    All other components within normal limits  PROTIME-INR - Abnormal; Notable for the following components:   Prothrombin Time 21.3 (*)    INR 1.8 (*)    All other components within normal limits  RESP PANEL BY RT-PCR (FLU A&B, COVID) [id number]    ____________________________________________  EKG: Atrial fibrillation, ventricular rate 104.  QRS 152, QTc 490.  Interventricular conduction delay.  Nonspecific T wave changes.  No acute ST elevations or depressions. ________________________________________  RADIOLOGY All imaging, including plain films, CT scans, and ultrasounds, independently reviewed by me, and interpretations confirmed via formal radiology  reads.  ED MD interpretation:   CT head: Large right hemispheric subdural with 13 mm midline shift CT C-spine: No acute fracture  Official radiology report(s): CT Head Wo Contrast  Result Date: 12/09/2020 CLINICAL DATA:  Fall. Headache dizziness nausea vomiting. Patient on Xarelto EXAM: CT HEAD WITHOUT CONTRAST CT CERVICAL SPINE WITHOUT CONTRAST TECHNIQUE: Multidetector CT imaging of the head and cervical spine was performed following the standard protocol without intravenous contrast. Multiplanar CT image reconstructions of the cervical spine were also generated. COMPARISON:  CT head 10/19/2011 FINDINGS: CT HEAD FINDINGS Brain: Large right-sided hemispheric subdural hematoma which is high density and acute. This measures approximately 15 mm in thickness. Mass-effect with 13 mm midline shift to the left. Small left frontal convexity subdural hematoma. No subarachnoid hemorrhage. Negative for acute infarct or mass. Ventricle size normal. Vascular: Negative for hyperdense vessel Skull: Negative for skull fracture Sinuses/Orbits: Negative Other: None CT CERVICAL SPINE FINDINGS Alignment: Mild anterolisthesis C4-5.  Mild anterolisthesis C7-T1. Skull base and vertebrae: Negative for fracture or skeletal mass. Soft tissues and spinal canal: Negative for mass or soft tissue swelling. Atherosclerotic calcification of the carotid bifurcation bilaterally. Disc levels: Disc degeneration and mild uncinate spurring C5-6 and C6-7. Bilateral facet degeneration. Foraminal narrowing bilaterally due to spurring. Upper chest: Lung apices clear bilaterally. Other: None IMPRESSION: 1. Large right hemispheric subdural hematoma with 13 mm midline shift to the left. Recommend urgent neuro surgical consultation. Small left frontal subdural hematoma over the convexity 2. Negative for cervical spine fracture 3. These results were called by telephone at the time of interpretation on 12/09/2020 at 1:36 pm to provider Duffy Bruce ,  who verbally acknowledged these results. Electronically Signed   By: Franchot Gallo M.D.   On: 12/09/2020 13:37   CT Cervical Spine Wo Contrast  Result Date: 12/09/2020 CLINICAL DATA:  Fall. Headache dizziness nausea vomiting. Patient on Xarelto EXAM: CT HEAD WITHOUT  CONTRAST CT CERVICAL SPINE WITHOUT CONTRAST TECHNIQUE: Multidetector CT imaging of the head and cervical spine was performed following the standard protocol without intravenous contrast. Multiplanar CT image reconstructions of the cervical spine were also generated. COMPARISON:  CT head 10/19/2011 FINDINGS: CT HEAD FINDINGS Brain: Large right-sided hemispheric subdural hematoma which is high density and acute. This measures approximately 15 mm in thickness. Mass-effect with 13 mm midline shift to the left. Small left frontal convexity subdural hematoma. No subarachnoid hemorrhage. Negative for acute infarct or mass. Ventricle size normal. Vascular: Negative for hyperdense vessel Skull: Negative for skull fracture Sinuses/Orbits: Negative Other: None CT CERVICAL SPINE FINDINGS Alignment: Mild anterolisthesis C4-5.  Mild anterolisthesis C7-T1. Skull base and vertebrae: Negative for fracture or skeletal mass. Soft tissues and spinal canal: Negative for mass or soft tissue swelling. Atherosclerotic calcification of the carotid bifurcation bilaterally. Disc levels: Disc degeneration and mild uncinate spurring C5-6 and C6-7. Bilateral facet degeneration. Foraminal narrowing bilaterally due to spurring. Upper chest: Lung apices clear bilaterally. Other: None IMPRESSION: 1. Large right hemispheric subdural hematoma with 13 mm midline shift to the left. Recommend urgent neuro surgical consultation. Small left frontal subdural hematoma over the convexity 2. Negative for cervical spine fracture 3. These results were called by telephone at the time of interpretation on 12/09/2020 at 1:36 pm to provider Duffy Bruce , who verbally acknowledged these results.  Electronically Signed   By: Franchot Gallo M.D.   On: 12/09/2020 13:37   DG Chest Portable 1 View  Result Date: 12/09/2020 CLINICAL DATA:  Hypoxia EXAM: PORTABLE CHEST 1 VIEW COMPARISON:  October 11, 2015 FINDINGS: Endotracheal tube tip is 7.1 cm above the carina. Nasogastric tube tip and side port in stomach. No pneumothorax. There is no edema or airspace opacity. Heart is mildly enlarged with pulmonary vascularity normal. There is aortic atherosclerosis. No bone lesions. IMPRESSION: Tube positions as described without evident pneumothorax. No edema or airspace opacity. Mild cardiac enlargement. Aortic Atherosclerosis (ICD10-I70.0). Electronically Signed   By: Lowella Grip III M.D.   On: 12/09/2020 15:20    ____________________________________________  PROCEDURES   Procedure(s) performed (including Critical Care):  .Critical Care  Date/Time: 12/09/2020 3:27 PM Performed by: Duffy Bruce, MD Authorized by: Duffy Bruce, MD   Critical care provider statement:    Critical care time (minutes):  35   Critical care time was exclusive of:  Separately billable procedures and treating other patients and teaching time   Critical care was necessary to treat or prevent imminent or life-threatening deterioration of the following conditions:  Cardiac failure, circulatory failure, respiratory failure and CNS failure or compromise   Critical care was time spent personally by me on the following activities:  Development of treatment plan with patient or surrogate, discussions with consultants, evaluation of patient's response to treatment, examination of patient, obtaining history from patient or surrogate, ordering and performing treatments and interventions, ordering and review of laboratory studies, ordering and review of radiographic studies, pulse oximetry, re-evaluation of patient's condition and review of old charts   I assumed direction of critical care for this patient from another provider  in my specialty: no   Procedure Name: Intubation Date/Time: 12/09/2020 3:27 PM Performed by: Duffy Bruce, MD Pre-anesthesia Checklist: Patient identified, Patient being monitored, Emergency Drugs available, Timeout performed and Suction available Oxygen Delivery Method: Non-rebreather mask Preoxygenation: Pre-oxygenation with 100% oxygen Induction Type: Rapid sequence Ventilation: Mask ventilation without difficulty Laryngoscope Size: Glidescope and 3 Tube size: 7.5 mm Number of attempts: 1 Airway Equipment and Method:  Rigid stylet and Video-laryngoscopy Placement Confirmation: ETT inserted through vocal cords under direct vision, CO2 detector and Breath sounds checked- equal and bilateral Secured at: 22 cm Tube secured with: ETT holder Dental Injury: Teeth and Oropharynx as per pre-operative assessment  Difficulty Due To: Difficulty was unanticipated Future Recommendations: Recommend- induction with short-acting agent, and alternative techniques readily available     ____________________________________________  INITIAL IMPRESSION / MDM / [place] / ED COURSE  As part of my medical decision making, I reviewed the following data within the [place] notes reviewed and incorporated, Old chart reviewed, Notes from prior ED visits, and [place] Controlled Substance Database       *Tiffany Shaffer was evaluated in Emergency Department on 12/09/2020 for the symptoms described in the history of present illness. She was evaluated in the context of the global COVID-19 pandemic, which necessitated consideration that the patient might be at risk for infection with the SARS-CoV-2 virus that causes COVID-19. Institutional protocols and algorithms that pertain to the evaluation of patients at risk for COVID-19 are in a state of rapid change based on information released by regulatory bodies including the CDC and federal and state organizations. These policies and  algorithms were followed during the patient's care in the ED.  Some ED evaluations and interventions may be delayed as a result of limited staffing during the pandemic.*     Medical Decision Making: 87 year old female here with altered mental status after fall.  Patient taken immediately for stat CT head and neck, which shows large subdural with 13 mm of shift.  INR 1.8 and patient is on Xarelto.  It is unclear whether she took it this morning.  Platelets normal.  CMP unremarkable.  Case discussed immediately with Dr. Cari Caraway, of neurosurgery, who will take for emergent craniotomy.  Patient increasingly confused.  Discussed management with family, patient is full code and would desire treatment.  She was apparently mobile and active prior to the fall.  She became increasingly confused in the ED, hypoxic, with concern for aspiration.  Patient subsequently intubated as above.  Plain films show appropriate ET tube placement.  Tolerated well.  Cardene started for blood pressure.  Taken to the OR.  Dr. Cari Caraway to transfer out after surgery.  ____________________________________________  FINAL CLINICAL IMPRESSION(S) / ED DIAGNOSES  Final diagnoses:  Subdural hematoma ([place])     MEDICATIONS GIVEN DURING THIS VISIT:  Medications  nicardipine (CARDENE) 20mg  in 0.86% saline 228ml IV infusion (0.1 mg/ml) (0 mg/hr Intravenous Paused 12/09/20 1430)  mannitol 20 % IVPB 50 g (50 g Intravenous New Bag/Given 12/09/20 1508)  levETIRAcetam (KEPPRA) IVPB 500 mg/100 mL premix ( Intravenous Automatically Held 12/17/20 1500)  propofol (DIPRIVAN) 1000 MG/100ML infusion (30 mcg/kg/min  77.1 kg Intravenous Rate/Dose Change 12/09/20 1511)  prothrombin complex conc human (KCENTRA) IVPB 3,835 Units (0 Units Intravenous Stopped 12/09/20 1435)  ondansetron (ZOFRAN) injection 4 mg (4 mg Intravenous Given 12/09/20 1350)  etomidate (AMIDATE) injection 10 mg (10 mg Intravenous Given 12/09/20 1440)  succinylcholine (ANECTINE)  injection 115.6 mg (115.6 mg Intravenous Given 12/09/20 1441)  fentaNYL (SUBLIMAZE) injection 75 mcg (75 mcg Intravenous Given 12/09/20 1447)     ED Discharge Orders     None        Note:  This document was prepared using Dragon voice recognition software and may include unintentional dictation errors.   Duffy Bruce, MD 12/09/20 [id number]

## 2020-12-09 NOTE — Progress Notes (Signed)
Physician emtala part filled out by this RN with dr Izora Ribas at bedside.

## 2020-12-09 NOTE — ED Triage Notes (Addendum)
Per pt daughter the pt tripped and fell hitting her head last night and today woke with a HA , dizziness with N/V.Tiffany Shaffer pt is alert on arrival. Pt does take xeralto

## 2020-12-09 NOTE — ED Notes (Signed)
XRAY  POWERSHARE  WITH  [facility] 

## 2020-12-09 NOTE — Op Note (Signed)
Indications: The patient is a 87 yo female who presented with an acute subdural hematoma.  She had rapidly declining neurological status, prompting need for surgical intervention.   Findings: acute subdural hematoma  Preoperative Diagnosis: acute subdural hematoma Postoperative Diagnosis: same   EBL: 200 ml IVF: see AR ml Drains: 1 placed Disposition: Intubated and critically ill to PACU Complications: none  A foley catheter was placed.   Preoperative Note:   Risks of surgery discussed include: infection, bleeding, stroke, coma, death, paralysis, CSF leak, nerve/spinal cord injury, numbness, tingling, weakness, complex regional pain syndrome, recurrent stenosis and/or disc herniation, vascular injury, development of instability, neck/back pain, need for further surgery, persistent symptoms, development of deformity, and the risks of anesthesia. The patient's daughter understood these risks and agreed to proceed.  NAME OF PROCEDURE:               1. Right Craniotomy for evacuation of acute subdural hematoma  PROCEDURE:  Patient was brought to the operating room intubated. The [place]field pins were applied.  The patient was then positioned for a right-sided frontotemporoparietal craniotomy.  Mannitol was infusing.    The incision was planned, then prepped and draped in standard fashion.  The incision was opened sharply, then the galea opened.  A myocutaneous flap was then developed and retracted with fish hooks. A craniotomy was then fashioned with the burr and craniotome.  The dura was identified, then opened sharply.  A large acute subdural hematoma was identified.  This was removed in piecemeal fashion with blunt dissectors, suction, and irrigation.  After removal of the hematoma, an area of active bleeding along the medial front lobe was identified.  This was coagulated.  The margins of the brain were inspected, then irrigated extensively.  Hemostasis was confirmed.   We then turned  attention to closure.  The craniotomy site was checked and a fixation plate used to reconstruct the skull. Tackup sutures were placed. The temporalis and galea were closed.  Staples were used on the skin.  A sterile dressing was placed.    Needle, lap and all counts were correct at the end of the case.    Meade Maw MD Neurosurgery

## 2020-12-09 NOTE — Progress Notes (Signed)
    Attending Progress Note  History: Tiffany Shaffer is here for acute subdural hematoma.  She is POD0 from craniotomy.  She is currently intubated and sedated.  Physical Exam: Vitals:   12/09/20 1850 12/09/20 1900  BP: 139/65 (!) 166/106  Pulse: 83 (!) 50  Resp: 15 15  Temp:    SpO2: 98% 99%    Intubated and heavily sedated. P4 to 2 Too sedated for additional exam  Data:  Recent Labs  Lab 12/09/20 1306  NA 131*  K 4.2  CL 95*  CO2 26  BUN 18  CREATININE 1.13*  GLUCOSE 166*  CALCIUM 9.3   Recent Labs  Lab 12/09/20 1306  AST 22  ALT 11  ALKPHOS 74     Recent Labs  Lab 12/09/20 1306  WBC 11.7*  HGB 14.5  HCT 42.6  PLT 182   Recent Labs  Lab 12/09/20 1306  INR 1.8*         Other tests/results: none to review  Assessment/Plan:  Tiffany Shaffer is critically ill but stable.  She is POD0 from craniotomy for subdural hematoma  - xfer to [facility] due to lack of ICU beds - she is stable for transport - HOB 30 degrees - hopefully she will be able to wean to extubate at [place] MD, [facility] Department of Neurosurgery

## 2020-12-09 NOTE — Progress Notes (Signed)
Report to matt with [facility].    1827-[facility] updated neo gtt started.

## 2020-12-09 NOTE — Anesthesia Preprocedure Evaluation (Addendum)
Anesthesia Evaluation  Patient identified by MRN, date of birth, ID band Patient unresponsive    Reviewed: Allergy & Precautions, NPO status , Patient's Chart, lab work & pertinent test results  Airway Mallampati: Intubated  TM Distance: >3 FB Neck ROM: Limited    Dental  (+) Chipped   Pulmonary sleep apnea ,    Pulmonary exam normal        Cardiovascular hypertension, + Peripheral Vascular Disease  + dysrhythmias Atrial Fibrillation      Neuro/Psych  Headaches, negative psych ROS   GI/Hepatic negative GI ROS, Neg liver ROS, GERD  ,  Endo/Other  negative endocrine ROS  Renal/GU Renal disease     Musculoskeletal   Abdominal   Peds  Hematology negative hematology ROS (+)   Anesthesia Other Findings Past Medical History: No date: Atrial fibrillation ([place]) 08/29/2016: Cancer ([facility])     Comment:  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 No date: Headache     Comment:  behind right eye 07/10/2013: History of blood clots     Comment:  Left lower leg No date: PVD (peripheral vascular disease) ([facility]) No date: SVT (supraventricular tachycardia) ([facility]) No date: Wears dentures     Comment:  full upper, p(artial lower  Past Surgical History: No date: ABDOMINAL HYSTERECTOMY No date: angiogram legs No date: BREAST BIOPSY; Left     Comment:  neg No date: BREAST BIOPSY; Right     Comment:  neg 07/01/2018: BREAST BIOPSY; Left     Comment:  ribbon clip, [place] Bx, neg 07/10/2017: BROW LIFT; Bilateral     Comment:  Procedure: BLEPHAROPLASTY  UPPER EYELID W EXCESS SKIN;                Surgeon: Karle Starch, MD;  Location: [place];  Service: Ophthalmology;  Laterality: Bilateral; No date: cataracts No date: COLON RESECTION No date: HERNIA REPAIR 07/10/2017: PTOSIS REPAIR; Bilateral     Comment:  Procedure: PTOSIS REPAIR RESECT EX;  Surgeon: Karle Starch, MD;  Location: [place];   Service:               Ophthalmology;  Laterality: Bilateral;  BMI    Body Mass Index: 28.29 kg/m      Reproductive/Obstetrics negative OB ROS                            Anesthesia Physical Anesthesia Plan  ASA: 5 and emergent  Anesthesia Plan: General ETT   Post-op Pain Management:    Induction: Intravenous  PONV Risk Score and Plan: Ondansetron, Dexamethasone, Midazolam and Treatment may vary due to age or medical condition  Airway Management Planned: Oral ETT  Additional Equipment:   Intra-op Plan:   Post-operative Plan: Post-operative intubation/ventilation  Informed Consent: I have reviewed the patients History and Physical, chart, labs and discussed the procedure including the risks, benefits and alternatives for the proposed anesthesia with the patient or authorized representative who has indicated his/her understanding and acceptance.     Dental Advisory Given  Plan Discussed with: Anesthesiologist, CRNA and Surgeon  Anesthesia Plan Comments: (Daughter consented for risks of anesthesia including but not limited to:  - adverse reactions to medications - damage to eyes, teeth, lips or other oral mucosa - nerve damage due to positioning  - sore  throat or hoarseness - Damage to heart, brain, nerves, lungs, other parts of body or loss of life  She voiced understanding.)       Anesthesia Quick Evaluation

## 2020-12-09 NOTE — ED Notes (Signed)
While hooking the pt up to the monitor, pt is noted to be diaphretic

## 2020-12-10 ENCOUNTER — Encounter: Payer: Self-pay | Admitting: Neurosurgery

## 2020-12-16 ENCOUNTER — Encounter: Payer: Self-pay | Admitting: *Deleted

## 2020-12-17 ENCOUNTER — Encounter: Admission: RE | Payer: Self-pay | Source: Home / Self Care

## 2020-12-17 ENCOUNTER — Ambulatory Visit: Admission: RE | Admit: 2020-12-17 | Payer: Medicare Other | Source: Home / Self Care

## 2020-12-17 HISTORY — DX: Hyperlipidemia, unspecified: E78.5

## 2020-12-17 HISTORY — DX: Chronic kidney disease, stage 3 unspecified: N18.30

## 2020-12-17 HISTORY — DX: Gastro-esophageal reflux disease without esophagitis: K21.9

## 2020-12-17 HISTORY — DX: Essential (primary) hypertension: I10

## 2020-12-17 HISTORY — DX: Obesity, unspecified: E66.9

## 2020-12-17 HISTORY — DX: Sleep apnea, unspecified: G47.30

## 2020-12-17 HISTORY — DX: Impaired glucose tolerance (oral): R73.02

## 2020-12-17 SURGERY — COLONOSCOPY WITH PROPOFOL
Anesthesia: General

## 2020-12-24 DEATH — deceased

## 2021-01-23 IMAGING — US US BREAST*L* LIMITED INC AXILLA
1 series · 7 of 7 positions shown · non-contrast
Comparison: Previous exam(s).

CLINICAL DATA: Six-month follow-up of a left breast mass at 4
o'clock, 10 cm from the nipple

EXAM:
ULTRASOUND OF THE LEFT BREAST

[Series 1: us breast*left* limited inc axilla · 0.05mm/px · 7 of 7 slices shown]
[im 1/7]
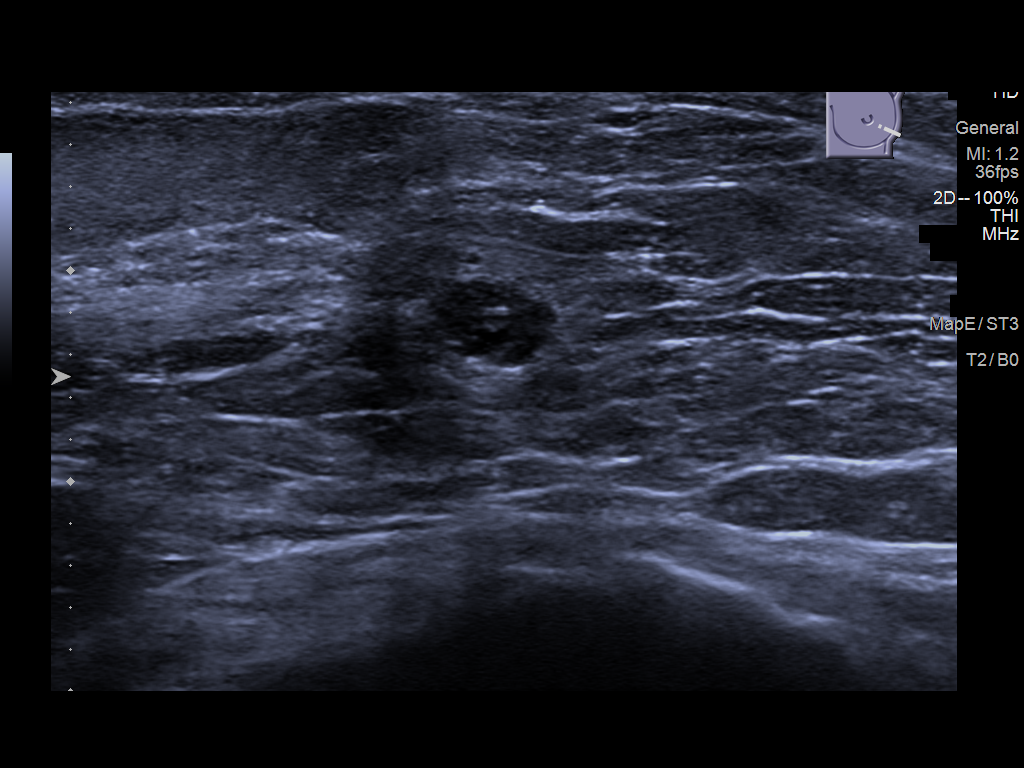
[im 2/7]
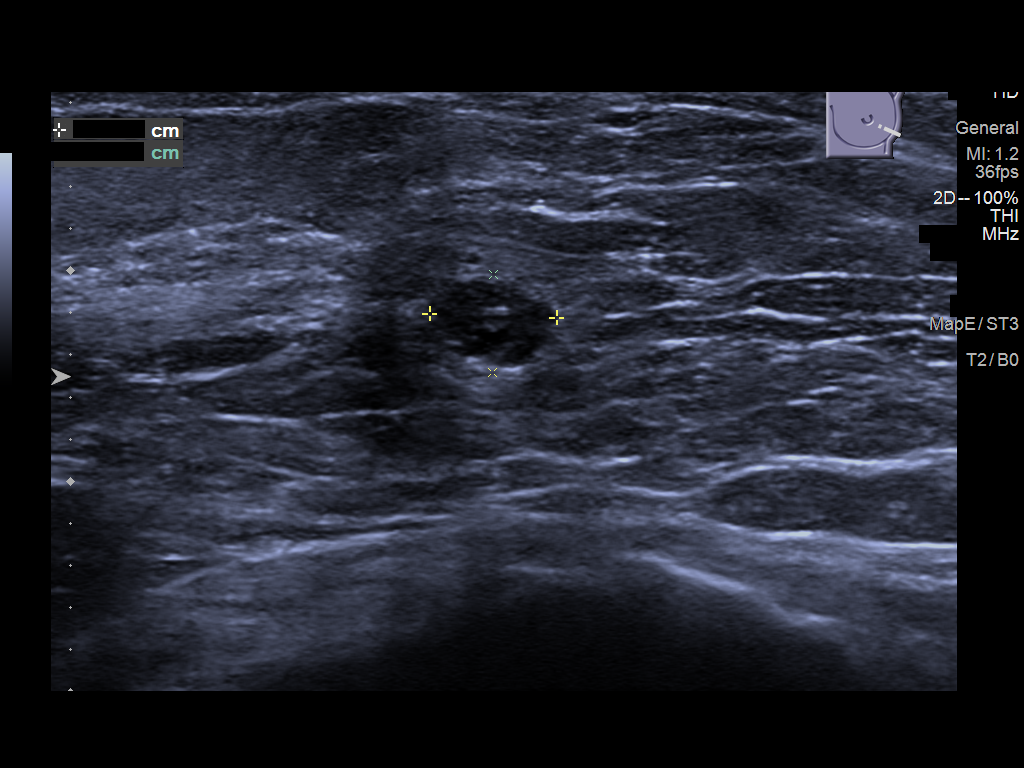
[im 3/7]
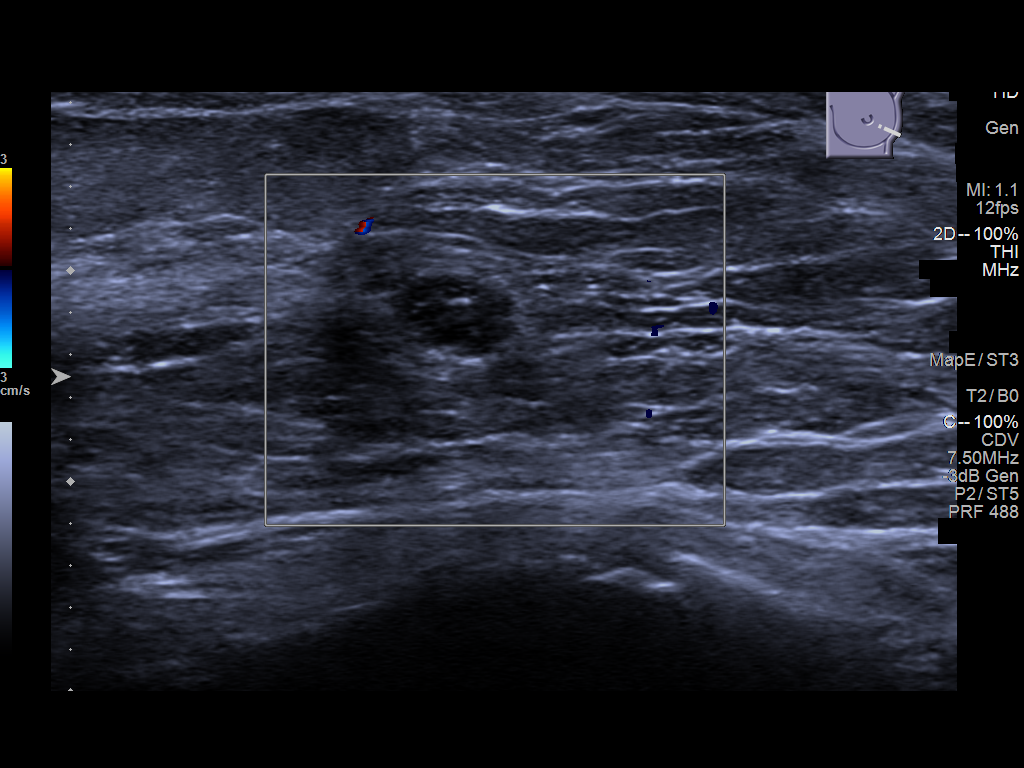
[im 4/7]
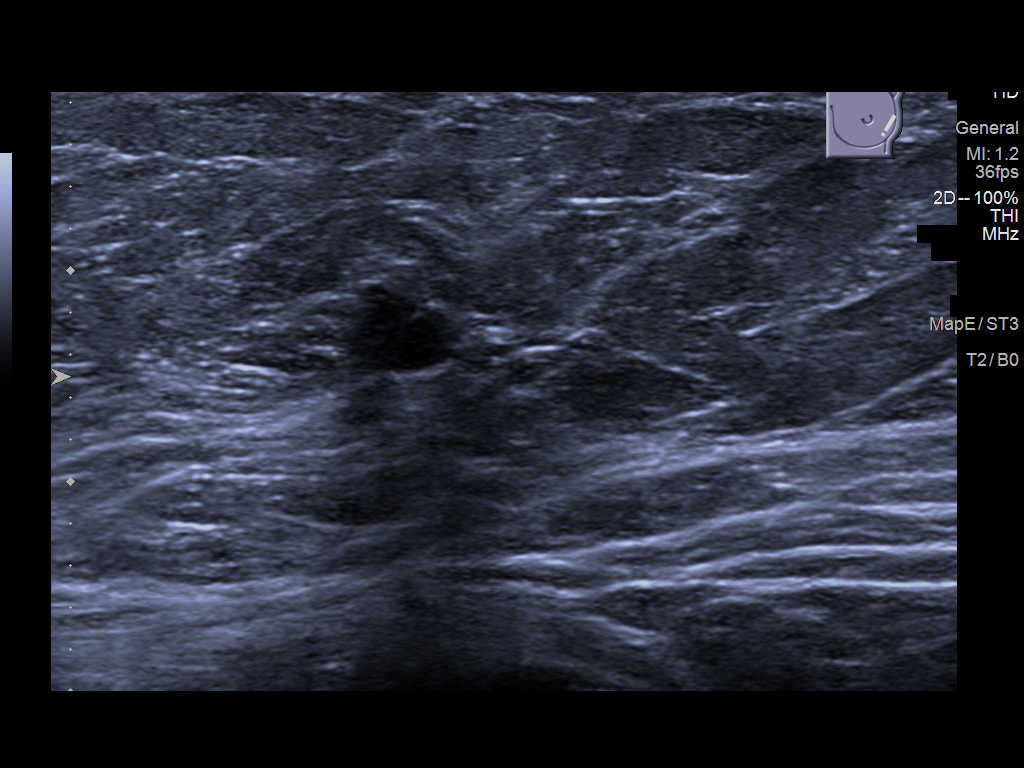
[im 5/7]
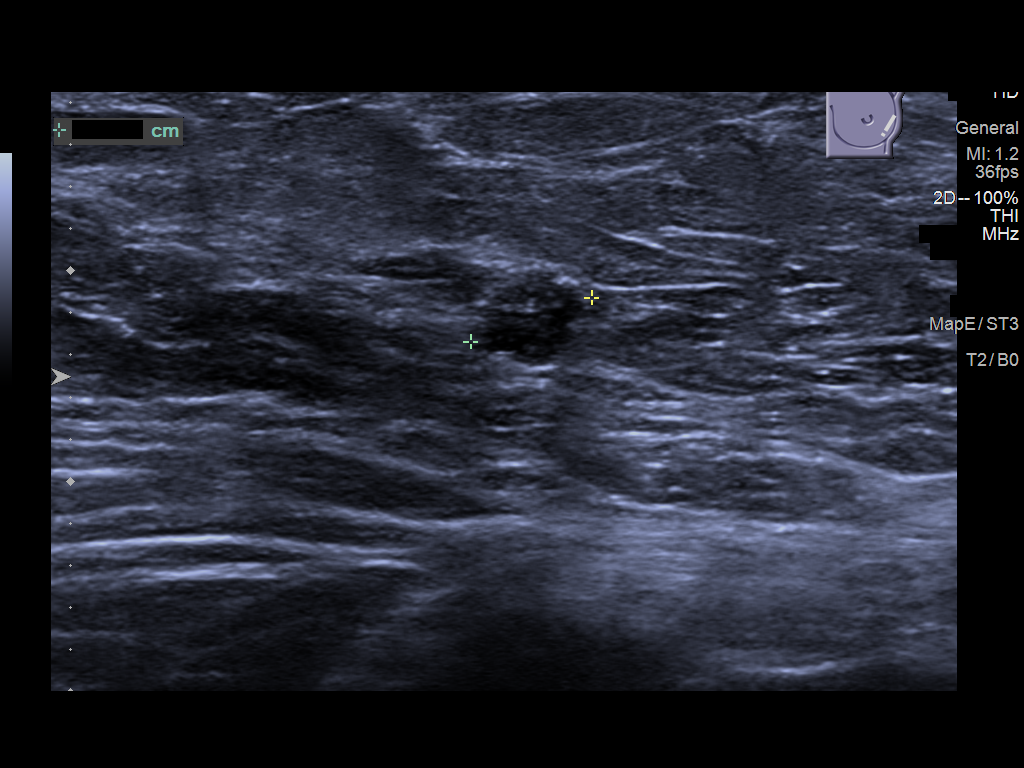
[im 6/7]
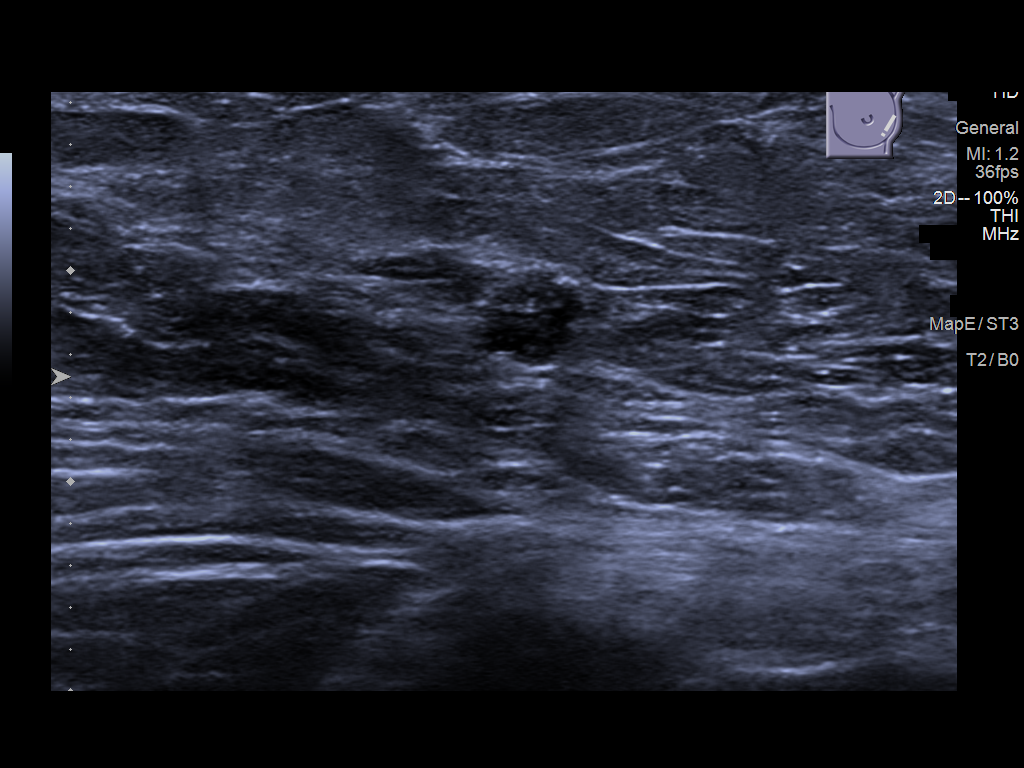
[im 7/7]
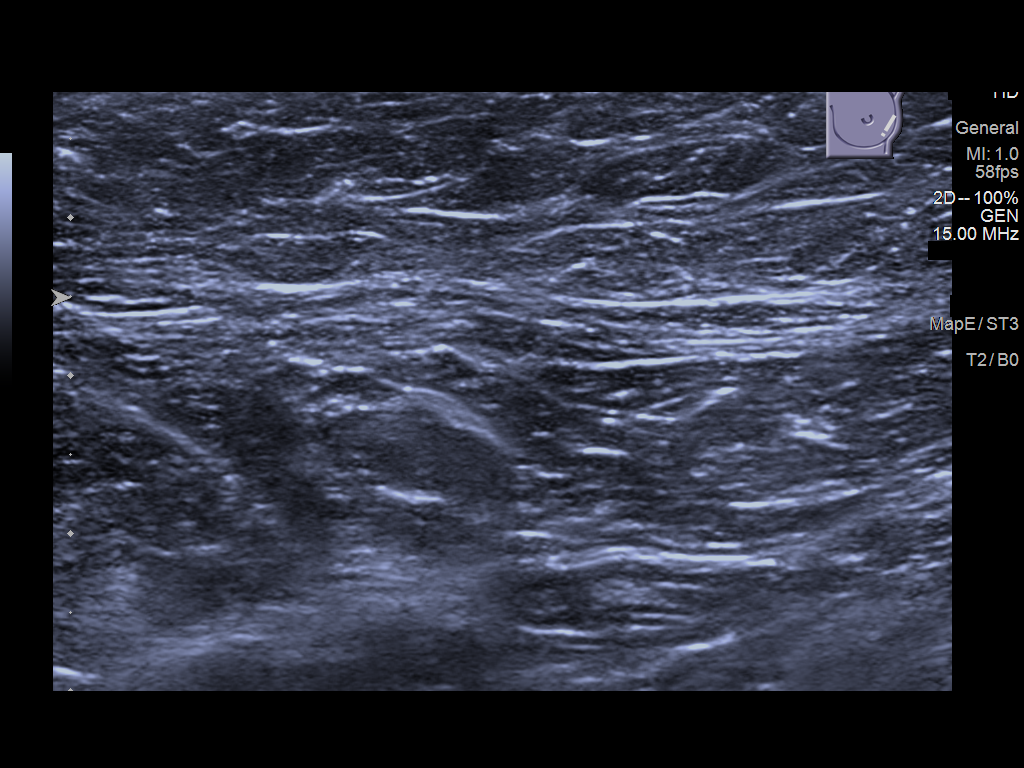

[7 of 7 positions shown; findings below may reference images not displayed]

FINDINGS: On physical exam, no suspicious lumps are identified.

Targeted ultrasound is performed, showing a mass in the left breast
at 4 o'clock, 10 cm from the nipple measuring 6 x 5 x 6 mm today
versus 6 x 5 x 7 mm previously. The mass looks less cystic today.
There are only a few small obviously cystic components. The mass
contains calcifications and demonstrates increased through
transmission. No axillary adenopathy.
IMPRESSION: The mass in the left breast has not grown. While the mass could be
apocrine metaplasia or a complicated cyst, a mass with solid and
cystic components is not excluded.

RECOMMENDATION:
Recommend ultrasound-guided biopsy of the left breast mass at 4
o'clock.

I have discussed the findings and recommendations with the patient.
If applicable, a reminder letter will be sent to the patient
regarding the next appointment.

BI-RADS CATEGORY  4: Suspicious.

## 2021-02-15 IMAGING — CR DG ABDOMEN ACUTE W/ 1V CHEST
4 series · 4 of 4 positions shown · non-contrast
Comparison: CT of the abdomen and pelvis 07/17/2019

CLINICAL DATA: Small bowel obstruction.

EXAM:
DG ABDOMEN ACUTE W/ 1V CHEST

[chest pa]
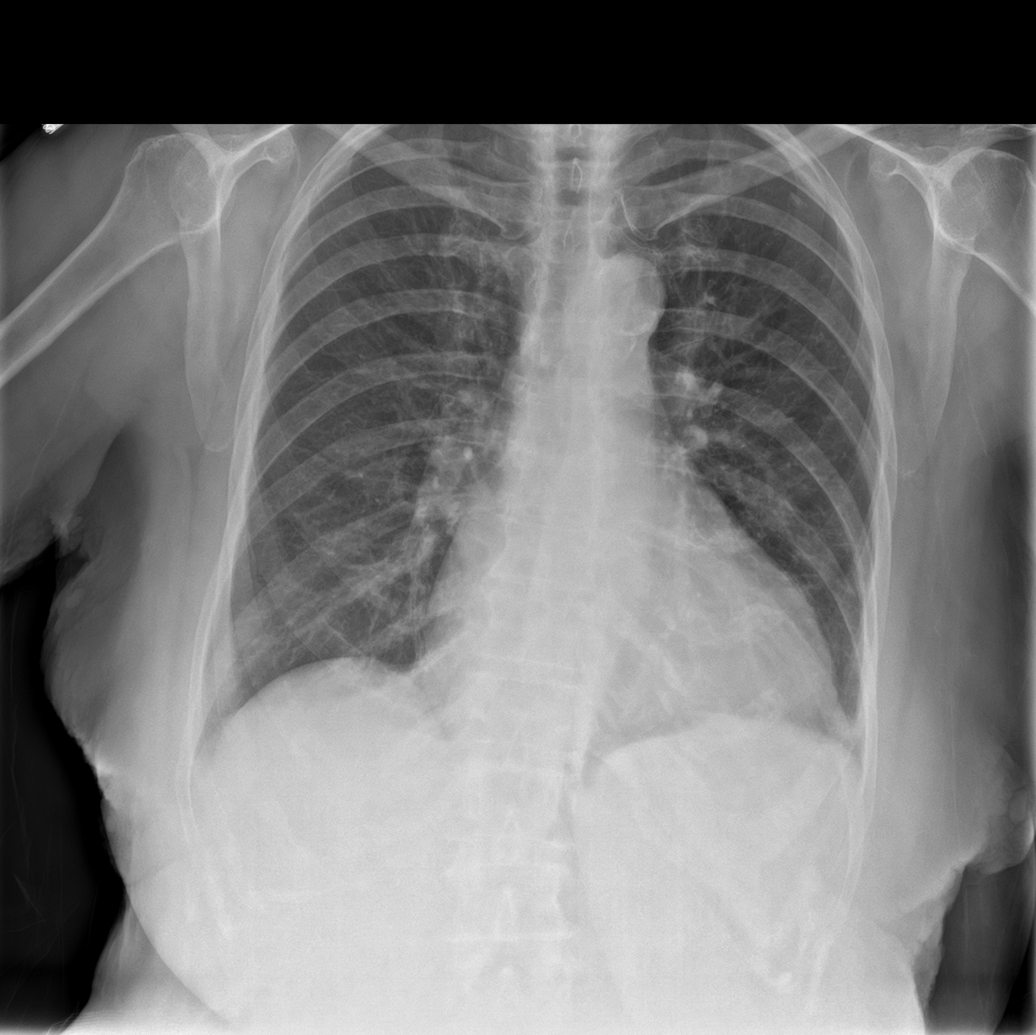

[abdomen erect]
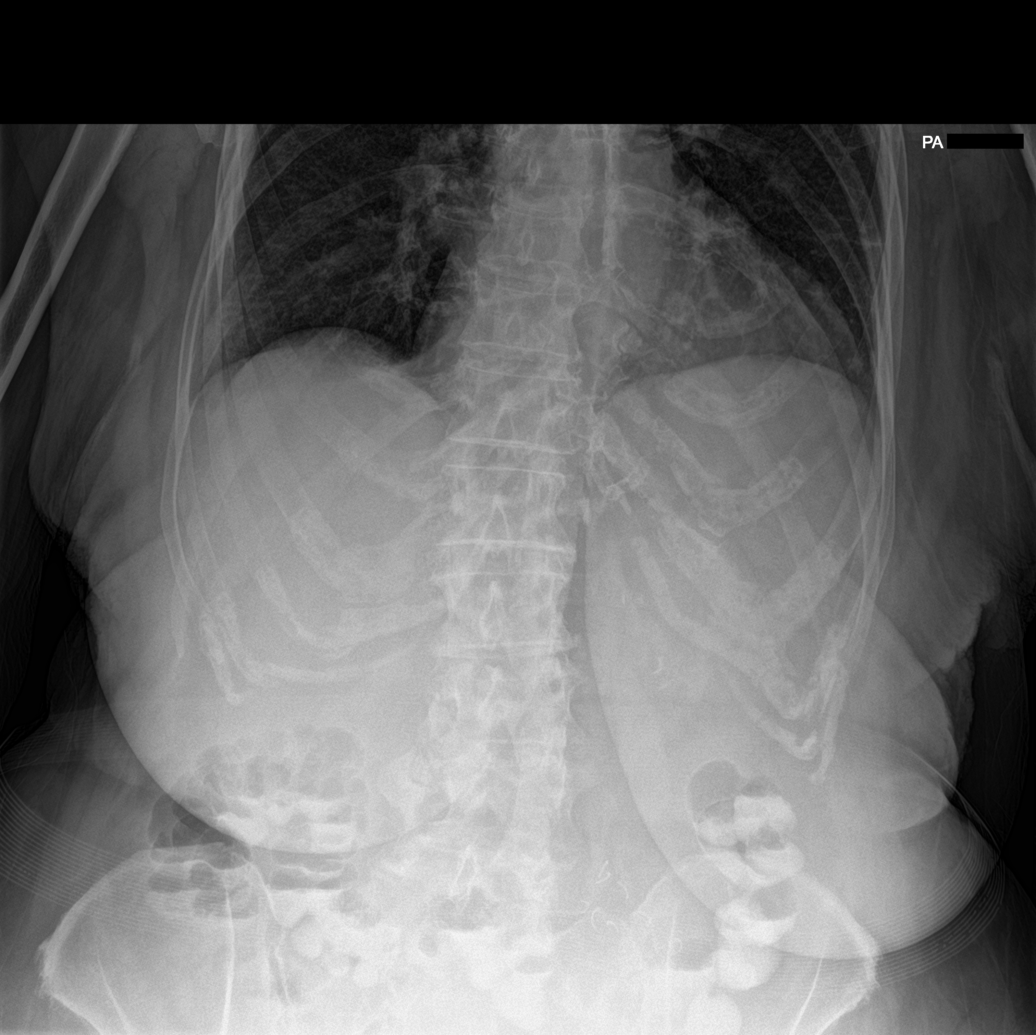

[abdomen supine (1 of 2)]
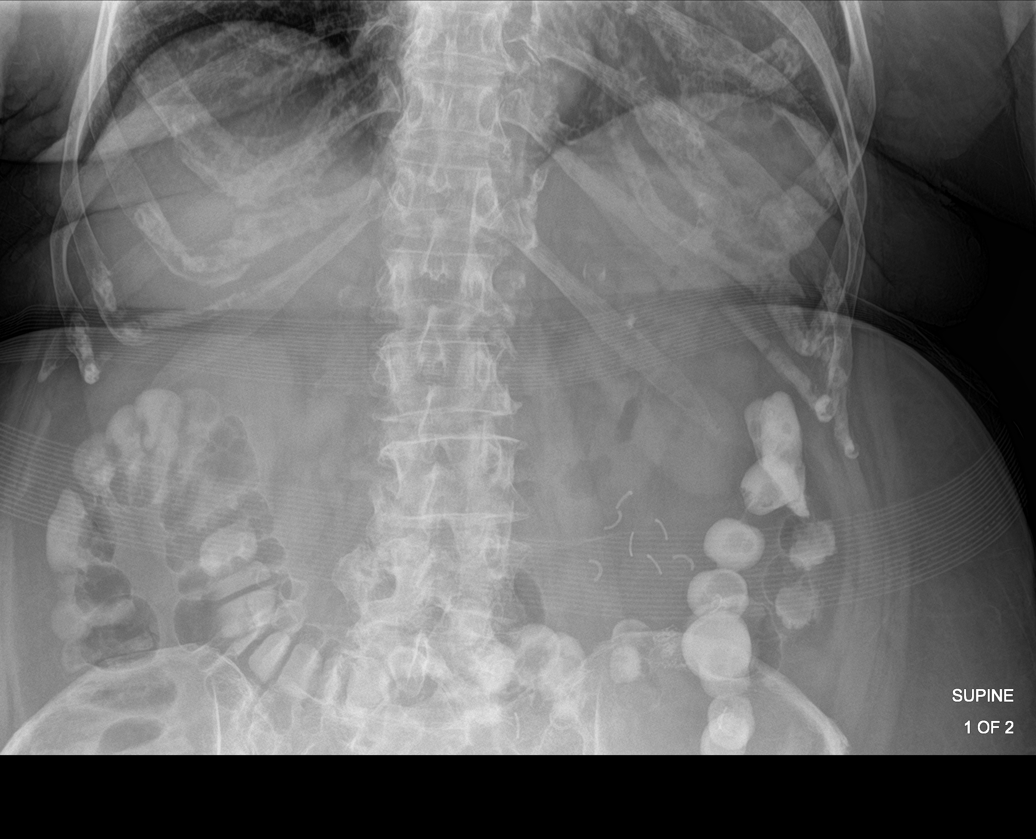

[abdomen supine (2 of 2)]
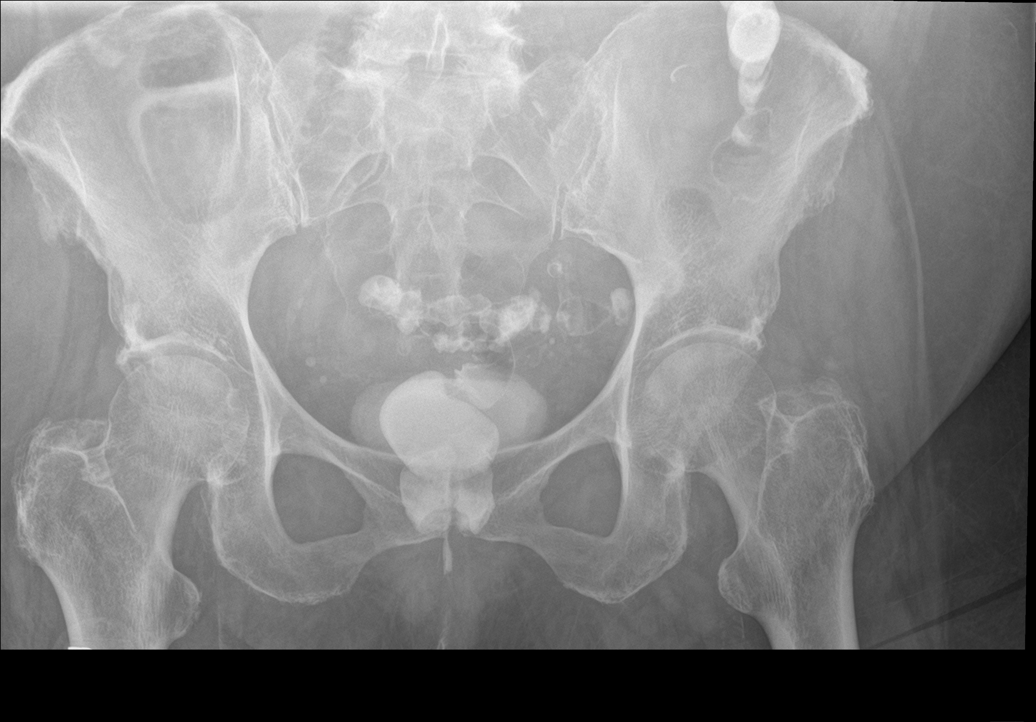

[4 of 4 positions shown; findings below may reference images not displayed]

FINDINGS: Heart is enlarged. Atherosclerotic changes are noted at the aortic
arch. There is no edema or effusion. No focal airspace disease is
present.

Oral contrast is evident throughout a nondistended colon. Small
bowel is within limits. Diverticular changes again noted in the
sigmoid colon. Degenerative changes are noted in the axial skeleton.
IMPRESSION: 1. No evidence for obstruction.
2. Oral contrast is evident throughout a nondistended colon
consistent with transit of bowel contents.
3. Sigmoid diverticulosis.
4. Cardiomegaly without failure.
5. No acute cardiopulmonary disease.

## 2021-07-05 ENCOUNTER — Encounter (INDEPENDENT_AMBULATORY_CARE_PROVIDER_SITE_OTHER): Payer: Medicare Other

## 2021-07-05 ENCOUNTER — Ambulatory Visit (INDEPENDENT_AMBULATORY_CARE_PROVIDER_SITE_OTHER): Payer: Medicare Other | Admitting: Vascular Surgery
